# Patient Record
Sex: Female | Born: 1985 | Race: Black or African American | Hispanic: No | Marital: Single | Smoking: Never smoker
Health system: Southern US, Community
[De-identification: ages and names within clinical notes are randomized; demographics above are authoritative.]

## PROBLEM LIST (undated history)

## (undated) DIAGNOSIS — M1990 Unspecified osteoarthritis, unspecified site: Secondary | ICD-10-CM

## (undated) DIAGNOSIS — IMO0002 Reserved for concepts with insufficient information to code with codable children: Secondary | ICD-10-CM

## (undated) DIAGNOSIS — R569 Unspecified convulsions: Secondary | ICD-10-CM

## (undated) DIAGNOSIS — F32A Depression, unspecified: Secondary | ICD-10-CM

## (undated) DIAGNOSIS — M2540 Effusion, unspecified joint: Secondary | ICD-10-CM

## (undated) DIAGNOSIS — M329 Systemic lupus erythematosus, unspecified: Secondary | ICD-10-CM

## (undated) DIAGNOSIS — M2550 Pain in unspecified joint: Secondary | ICD-10-CM

## (undated) DIAGNOSIS — F329 Major depressive disorder, single episode, unspecified: Secondary | ICD-10-CM

## (undated) HISTORY — DX: Reserved for concepts with insufficient information to code with codable children: IMO0002

## (undated) HISTORY — DX: Systemic lupus erythematosus, unspecified: M32.9

## (undated) HISTORY — DX: Pain in unspecified joint: M25.50

---

## 2010-07-30 ENCOUNTER — Ambulatory Visit
Admission: RE | Admit: 2010-07-30 | Discharge: 2010-07-30 | Disposition: A | Discharge status: Home / Self Care | Payer: Federal, State, Local not specified - PPO | Source: Ambulatory Visit | Attending: Family Medicine | Admitting: Family Medicine

## 2010-07-30 ENCOUNTER — Other Ambulatory Visit: Payer: Self-pay | Admitting: Family Medicine

## 2010-07-30 DIAGNOSIS — M2550 Pain in unspecified joint: Secondary | ICD-10-CM

## 2010-08-07 ENCOUNTER — Ambulatory Visit: Payer: Federal, State, Local not specified - PPO

## 2011-07-19 ENCOUNTER — Ambulatory Visit: Payer: Federal, State, Local not specified - PPO

## 2011-07-19 ENCOUNTER — Ambulatory Visit (INDEPENDENT_AMBULATORY_CARE_PROVIDER_SITE_OTHER): Payer: Federal, State, Local not specified - PPO | Admitting: Family Medicine

## 2011-07-19 ENCOUNTER — Encounter: Payer: Self-pay | Admitting: Family Medicine

## 2011-07-19 VITALS — BP 109/73 | HR 67 | Temp 98.1°F | Resp 16 | Ht 60.5 in | Wt 110.0 lb

## 2011-07-19 DIAGNOSIS — M542 Cervicalgia: Secondary | ICD-10-CM

## 2011-07-19 DIAGNOSIS — R52 Pain, unspecified: Secondary | ICD-10-CM

## 2011-07-19 DIAGNOSIS — M2550 Pain in unspecified joint: Secondary | ICD-10-CM

## 2011-07-19 LAB — CBC WITH DIFFERENTIAL/PLATELET
Basophils Relative: 0 % (ref 0–1)
Eosinophils Absolute: 0.5 10*3/uL (ref 0.0–0.7)
MCH: 29.5 pg (ref 26.0–34.0)
MCHC: 34.3 g/dL (ref 30.0–36.0)
Neutrophils Relative %: 57 % (ref 43–77)
Platelets: 513 10*3/uL — ABNORMAL HIGH (ref 150–400)
RDW: 12.5 % (ref 11.5–15.5)

## 2011-07-19 LAB — SEDIMENTATION RATE: Sed Rate: 32 mm/hr — ABNORMAL HIGH (ref 0–22)

## 2011-07-19 LAB — BASIC METABOLIC PANEL
CO2: 25 mEq/L (ref 19–32)
Calcium: 9.1 mg/dL (ref 8.4–10.5)
Creat: 0.61 mg/dL (ref 0.50–1.10)

## 2011-07-19 MED ORDER — CYCLOBENZAPRINE HCL 5 MG PO TABS: ORAL_TABLET | ORAL | Status: DC

## 2011-07-19 MED ORDER — MELOXICAM 15 MG PO TABS: ORAL_TABLET | ORAL | Status: DC

## 2011-07-19 NOTE — Patient Instructions (Addendum)

## 2011-07-19 NOTE — Progress Notes (Signed)
  Subjective:    Patient ID: Rose Campbell, female    DOB: 08/01/1985, 26 y.o.   MRN: [id number]  HPI This 26 y.o  AA female is new to [facility]; she c/o chronic diffuse joint pain involving neck, hands,  hips and ankles (esp. left foot). She also has fatigue. She has a sedentary job and denies any acute trauma  to explain joint pain. Prior primary care was in [place], [place]. Never had a comprehensive  evaluation of these symptoms. She had a right hip xray in July 2012 that was normal (sacroiliitis could not  be excluded).   Review of Systems  Constitutional: Positive for fatigue. Negative for fever, chills, activity change, appetite change and unexpected weight change.  HENT: Positive for neck pain. Negative for sore throat, trouble swallowing and neck stiffness.   Eyes: Negative.   Respiratory: Negative for cough, chest tightness and shortness of breath.   Cardiovascular: Negative.   Gastrointestinal: Negative.   Genitourinary: Negative.   Musculoskeletal: Positive for joint swelling and arthralgias. Negative for myalgias, back pain and gait problem.  Skin: Negative.   Neurological: Negative for dizziness, weakness, light-headedness, numbness and headaches.  Hematological: Negative.   Psychiatric/Behavioral: Negative.        Objective:   Physical Exam  Nursing note and vitals reviewed. Constitutional: She is oriented to person, place, and time. She appears well-developed and well-nourished. No distress.  HENT:  Head: Normocephalic and atraumatic.  Right Ear: External ear normal.  Left Ear: External ear normal.  Nose: Nose normal.  Mouth/Throat: Oropharynx is clear and moist.  Eyes: Conjunctivae and EOM are normal. Pupils are equal, round, and reactive to light. No scleral icterus.  Neck: No thyromegaly present.       Decreased ROM with tenderness and spasms in posterior muscles  Cardiovascular: Normal rate, regular rhythm and normal heart sounds.  Exam reveals no gallop.    No murmur heard. Pulmonary/Chest: Effort normal and breath sounds normal. No respiratory distress. She has no wheezes.  Abdominal: Soft. She exhibits no mass. There is no tenderness. There is no guarding.  Musculoskeletal: Normal range of motion. She exhibits tenderness. She exhibits no edema.       Back- SI joints tender with palpation Left foot- tender forefoot; Bilateral arches are fairly well preserved No effusions or redness in any joints; no crepitus  Lymphadenopathy:    She has no cervical adenopathy.  Neurological: She is alert and oriented to person, place, and time. She has normal reflexes. No cranial nerve deficit. She exhibits normal muscle tone. Coordination normal.  Skin: Skin is warm and dry. No rash noted. No erythema.     [facility] reading (PRIMARY) by  Dr. Audria Nine: loss of lordotic curve in cervical spine. No other abnormalities       Assessment & Plan:   1. Joint pain  Vitamin D, 25-hydroxy, CBC with Differential, Sedimentation Rate, Basic metabolic panel, ANA, Rheumatoid factor  RX: Meloxicam 15 mg 1 tablet daily with meal or snack Do not take Ibuprofen with this medication RX: Cyclobenzaprine 5 mg 1 tab bid or hs prn spasms    2. Posterior neck pain  DG Cervical Spine 2-3 Views- pt advised of my reading

## 2011-07-20 ENCOUNTER — Telehealth: Payer: Self-pay

## 2011-07-20 LAB — RHEUMATOID FACTOR: Rhuematoid fact SerPl-aCnc: <10 IU/mL (ref <=14)

## 2011-07-20 NOTE — Telephone Encounter (Signed)
Pt is needing a rx that should have been called in to [facility] norethindrone-ethinyl estradion 1-35mg  please contact pt to let her know it has been [facility] to [facility] so she can go pick-up.Marland Kitchen

## 2011-07-20 NOTE — Telephone Encounter (Signed)
Dr Mcpherson, please advise.

## 2011-07-20 NOTE — Telephone Encounter (Signed)
Please advise... Do not see where this was discussed-- ? May not have finished documenting yet?

## 2011-07-23 ENCOUNTER — Encounter: Payer: Self-pay | Admitting: Family Medicine

## 2011-07-23 DIAGNOSIS — M2550 Pain in unspecified joint: Secondary | ICD-10-CM | POA: Insufficient documentation

## 2011-07-23 NOTE — Progress Notes (Signed)
Quick Note:  Please call pt and advise that ...  She is scheduled to come back in 6 weeks but I would like for her to come in within next few weeks to go over results and discuss further evaluation. ______

## 2011-07-24 ENCOUNTER — Telehealth: Payer: Self-pay

## 2011-07-24 ENCOUNTER — Other Ambulatory Visit: Payer: Self-pay | Admitting: Family Medicine

## 2011-07-24 MED ORDER — NORETHIN-ETH ESTRAD TRIPHASIC 0.5/0.75/1-35 MG-MCG PO TABS: 1.0000 | ORAL_TABLET | Freq: Every day | ORAL | Status: DC

## 2011-07-24 NOTE — Telephone Encounter (Signed)
Pt is wondering about her script for     Pt is needing a rx that should have been called in to [facility] norethindrone-ethinyl estradion 1-35mg 

## 2011-07-24 NOTE — Progress Notes (Addendum)
Spoke w/pt who reported that she did not need her BCPs. She was calling bc she thought there was a third medication that Dr Audria Nine had wanted her to take for her neck pain in addition to the Mobic and Flexeril that was sent to pharmacy (which pt states is not working). Asked Dr Audria Nine who stated there were no other medications she wanted pt on right now, but she continue the two Rxs which may begin to help more if given more time. Dr Audria Nine DOES want pt to schedule an appt to see her in the next 2-3 wks instead of waiting 6 wks d/t some abnormal labs that they need to discuss and see if further evaluation is needed. Called pt back and LMOM per pt request with the above information.

## 2011-07-31 NOTE — Telephone Encounter (Signed)
Left message that rx was sent to pharm on 7/3 and to CB if she did not get it or has any ?'s

## 2011-07-31 NOTE — Telephone Encounter (Signed)
This OCP was refilled by me on 07/24/11 and routed to her pharmacy.

## 2011-08-09 ENCOUNTER — Ambulatory Visit (INDEPENDENT_AMBULATORY_CARE_PROVIDER_SITE_OTHER): Payer: Federal, State, Local not specified - PPO | Admitting: Family Medicine

## 2011-08-09 ENCOUNTER — Encounter: Payer: Self-pay | Admitting: Family Medicine

## 2011-08-09 VITALS — BP 117/82 | HR 67 | Temp 98.1°F | Resp 16 | Ht 60.0 in | Wt 113.2 lb

## 2011-08-09 DIAGNOSIS — M329 Systemic lupus erythematosus, unspecified: Secondary | ICD-10-CM

## 2011-08-09 DIAGNOSIS — IMO0002 Reserved for concepts with insufficient information to code with codable children: Secondary | ICD-10-CM | POA: Insufficient documentation

## 2011-08-09 MED ORDER — PREDNISONE 10 MG PO TABS: ORAL_TABLET | ORAL | Status: DC

## 2011-08-09 NOTE — Patient Instructions (Addendum)
Lupus Lupus (also called systemic lupus erythematosus, SLE) is a disorder of the body's natural defense system (immune system). In lupus, the immune system attacks various areas of the body (autoimmune disease). CAUSES The cause is unknown. However, lupus runs in families. Certain genes can make you more likely to develop lupus. It is 10 times more common in women than in men. Lupus is also more common in African Americans and Asians. Other factors also play a role, such as viruses (Epstein-Barr virus, EBV), stress, hormones, cigarette smoke, and certain drugs. SYMPTOMS Lupus can affect many parts of the body, including the joints, skin, kidneys, lungs, heart, nervous system, and blood vessels. The signs and symptoms of lupus differ from person to person. The disease can range from mild to life-threatening. Typical features of lupus include:  Butterfly-shaped rash over the face.   Arthritis involving one or more joints.   Kidney disease.   Fever, weight loss, hair loss, fatigue.   Poor circulation in the fingers and toes (Raynaud's disease).   Chest pain when taking deep breaths. Abdominal pain may also occur.   Skin rash in areas exposed to the sun.   Sores in the mouth and nose.  DIAGNOSIS Diagnosing lupus can take a long time and is often difficult. An exam and an accurate account of your symptoms and health problems is very important. Blood tests are necessary, though no single test can confirm or rule out lupus. Most people with lupus test positive for antinuclear antibodies (ANA) on a blood test. Additional blood tests, a urine test (urinalysis), and sometimes a kidney or skin tissue sample (biopsy) can help to confirm or rule out lupus. TREATMENT There is no cure for lupus. Your caregiver will develop a treatment plan based on your age, sex, health, symptoms, and lifestyle. The goals are to prevent flares, to treat them when they do occur, and to minimize organ damage and  complications. How the disease may affect each person varies widely. Most people with lupus can live normal lives, but this disorder must be carefully monitored. Treatment must be adjusted as necessary to prevent serious complications. Medicines used for treatment:  Nonsteroidal anti-inflammatory drugs (NSAIDs) decrease inflammation and can help with chest pain, joint pain, and fevers. Examples include ibuprofen and naproxen.   Antimalarial drugs were designed to treat malaria. They also treat fatigue, joint pain, skin rashes, and inflammation of the lungs in patients with lupus.   Corticosteroids are powerful hormones that rapidly suppress inflammation. The lowest dose with the highest benefit will be chosen. They can be given by cream, pills, injections, and through the vein (intravenously).   Immunosuppressive drugs block the making of immune cells. They may be used for kidney or nerve disease.  HOME CARE INSTRUCTIONS  Exercise. Low-impact activities can usually help keep joints flexible without being too strenuous.   Rest after periods of exercise.   Avoid excessive sun exposure.   Follow proper nutrition and take supplements as recommended by your caregiver.   Stress management can be helpful.  SEEK MEDICAL CARE IF:  You have increased fatigue.   You develop pain.   You develop a rash.   You have an oral temperature above 102 F (38.9 C).   You develop abdominal discomfort.   You develop a headache.   You experience dizziness.  FOR MORE INFORMATION National Institute of Neurological Disorders and Stroke: www.ninds.nih.gov American College of Rheumatology: www.rheumatology.org National Institute of Arthritis and Musculoskeletal and Skin Diseases: www.niams.nih.gov Document Released: 12/28/2001 Document Revised:   12/27/2010 Document Reviewed: 04/20/2009 [facility] Patient Information 2012 [place], [place].    DIRECTIONS for taking Prednisone 10 mg tablets-  Day 1-3          2 tablets  3 times a day Day 4-7         2 tablets  2 times a day Day 8- 12      1 tablet    2 times a day Day 13 -        1 tablet daily   Until you see the Rheumatologist

## 2011-08-11 NOTE — Progress Notes (Signed)
  Subjective:    Patient ID: Rose Campbell, female    DOB: May 30, 1985, 26 y.o.   MRN: [id number]  HPI   The pt is a 26 y.o. AA female who first presented with diffuse joint pain, onset years ago. She continues  to have diffuse pain despite Meloxicam and Cyclobenzaprine. She was asked to return to clinic to review  abnormal lab results.   Family Hx: Sister with Autoimmune disorder                    2nd cousin with Lupus    Review of Systems  Constitutional: Positive for fatigue. Negative for fever, activity change, appetite change and unexpected weight change.  Eyes: Negative for visual disturbance.  Respiratory: Negative for chest tightness and shortness of breath.   Cardiovascular: Negative for chest pain.  Gastrointestinal: Negative.   Musculoskeletal: Positive for joint swelling and arthralgias.  Skin: Negative.   Neurological: Negative.   Hematological: Negative.   Psychiatric/Behavioral: Negative.        Objective:   Physical Exam  Nursing note and vitals reviewed. Constitutional: She is oriented to person, place, and time. She appears well-developed and well-nourished. No distress.  HENT:  Head: Normocephalic and atraumatic.  Eyes: Conjunctivae and EOM are normal. No scleral icterus.  Neck: No thyromegaly present.  Cardiovascular: Normal rate and regular rhythm.   Pulmonary/Chest: Effort normal. No respiratory distress.  Musculoskeletal: She exhibits tenderness.       Lower ext joints: knees and ankles are swollen with decreased ROM and tenderness  Neurological: She is alert and oriented to person, place, and time. No cranial nerve deficit.  Skin: Skin is warm and dry. No rash noted. No erythema.  Psychiatric: She has a normal mood and affect. Her behavior is normal. Thought content normal.       Mood is slightly depressed    LABS: ANA + ; homogenous   Titer= 1:320    ESR= 32    Rheumatoid factor- negative (<10)            H/H= 11.7/34.1   Plts=513K      Vit D=  32      Assessment & Plan:   1. Lupus - reviewed currently understood etiology of disease Ambulatory referral to Rheumatology Trial of steroids- Prednisone 10 mg; pt given directions for administration with starting dose= 60 mg daily, tapering to 10 mg by end of week #2.  Pt's sister very helpful in reassuring pt that this medication will reduce symptoms (joint pain); we discussed potential side effects  Pt to contact office with any questions or concerns

## 2011-09-12 ENCOUNTER — Telehealth: Payer: Self-pay

## 2011-09-12 DIAGNOSIS — G8929 Other chronic pain: Secondary | ICD-10-CM

## 2011-09-12 DIAGNOSIS — M25562 Pain in left knee: Secondary | ICD-10-CM

## 2011-09-12 NOTE — Telephone Encounter (Signed)
I have called patient to advise.  

## 2011-09-12 NOTE — Telephone Encounter (Signed)
Referral made to [facility] or [facility]

## 2011-09-12 NOTE — Telephone Encounter (Signed)
PT STATES DR MCPHERSON REFERRED HER TO A [facility] AND SHE HAD A HORRIBLE EXPERIENCE AND WILL NOT BE GOING BACK TO THEM, BUT WOULD LIKE  TO BE REFERRED SOMEWHERE. PLEASE CALL [phone]

## 2011-10-14 NOTE — Telephone Encounter (Signed)
DONE

## 2011-11-06 ENCOUNTER — Encounter: Payer: Self-pay | Admitting: Family Medicine

## 2011-11-06 ENCOUNTER — Ambulatory Visit (INDEPENDENT_AMBULATORY_CARE_PROVIDER_SITE_OTHER): Payer: Federal, State, Local not specified - PPO | Admitting: Family Medicine

## 2011-11-06 VITALS — BP 110/66 | HR 77 | Temp 98.5°F | Resp 16 | Ht 61.0 in | Wt 116.0 lb

## 2011-11-06 DIAGNOSIS — M329 Systemic lupus erythematosus, unspecified: Secondary | ICD-10-CM

## 2011-11-06 MED ORDER — PREDNISONE 10 MG PO TABS: ORAL_TABLET | ORAL | Status: DC

## 2011-11-06 NOTE — Progress Notes (Signed)
S:  This young 26 y.o. AA female has a presumptive diagnosis of Lupus based on elevated ANA titer and symptoms that have been responsive to Prednisone. She was seen by a Rheumatologist in July and did not have a pleasant experience.  She requested a second opinion in August which was scheduled with [facility] practice but had to cancel that appt. She is here today to get medication refill ( joint pain has recurred in hip and ankle) and to have referral rescheduled.  ROS: Negative for diaphoresis, fever/chills, rash, nausea/vomiting, abdominal pain, SOB, CP or tightness, numbness or weakness.  Positive for mild fatigue and joint pain with swelling.  O:  Filed Vitals:   11/06/11 0843  BP: 110/66                                     Weight is up 3 lbs  Pulse: 77  Temp: 98.5 F (36.9 C)  Resp: 16   GEN: In NAD; WN,WD. HENT: [place]/AT; EOMI with clear conj/scl. Otherwise normal. COR: RRR. LUNGS: Normal resp rate; unlabored. MS: Ankle joints mildly swollen w/o significant erythema. NEURO: A&O x 3; CNs intact. Nonfocal.  A/P: 1. SLE (systemic lupus erythematosus)  Ambulatory referral to Rheumatology (Dr. Corliss Skains ar [facility]); Refill Prednisone 10 mg daily or as directed

## 2011-11-06 NOTE — Patient Instructions (Signed)
You are being referred to Dr. Corliss Skains at [facility] ( [facility] Medicine Orthopedic practice); she is a Publishing rights manager. If the appt date and time do not work with your schedule, please call them to get an appt that works for you.  Prednisone 10 mg tablets have been refilled; take 1 tablet twice a day for 5 days then reduce your dose to 1 tablet daily. Take all doses with food or snack.

## 2012-02-20 ENCOUNTER — Telehealth: Payer: Self-pay | Admitting: Radiology

## 2012-02-20 MED ORDER — PREDNISONE 10 MG PO TABS: ORAL_TABLET | ORAL | Status: DC

## 2012-02-20 NOTE — Telephone Encounter (Signed)
Please advise on Rx for prednisone 10mg . [facility] called, state they electronically submitted, but I do not see this request. Please advise, looks like you referred to Rheumatology.

## 2012-02-20 NOTE — Telephone Encounter (Signed)
I have authorized Prednisone x one refill. I did order Rheumatology referral in October 2013. I am concerned that she is not being managed by a specialist as she needs; please find out what the delay is re: Rheumatology Evaluation. Thank you.

## 2012-02-21 NOTE — Telephone Encounter (Signed)
Voice mail box not set up yet

## 2012-02-24 NOTE — Telephone Encounter (Signed)
Letter sent.

## 2012-05-01 ENCOUNTER — Ambulatory Visit
Admission: RE | Admit: 2012-05-01 | Discharge: 2012-05-01 | Disposition: A | Discharge status: Home / Self Care | Payer: Federal, State, Local not specified - PPO | Source: Ambulatory Visit | Attending: Orthopaedic Surgery | Admitting: Orthopaedic Surgery

## 2012-05-01 ENCOUNTER — Other Ambulatory Visit: Payer: Self-pay | Admitting: Orthopaedic Surgery

## 2012-05-01 DIAGNOSIS — M25552 Pain in left hip: Secondary | ICD-10-CM

## 2012-05-01 LAB — SYNOVIAL CELL COUNT + DIFF, W/ CRYSTALS
Crystals, Fluid: NONE SEEN
Eosinophils-Synovial: 0 % (ref 0–1)
Neutrophil, Synovial: 93 % — ABNORMAL HIGH (ref 0–25)

## 2012-05-01 LAB — GLUCOSE, SYNOVIAL FLUID: Glucose, Synovial Fluid: 24 mg/dL

## 2012-05-04 LAB — BODY FLUID CULTURE: Gram Stain: NONE SEEN

## 2012-05-06 ENCOUNTER — Other Ambulatory Visit: Payer: Self-pay | Admitting: Orthopaedic Surgery

## 2012-05-06 DIAGNOSIS — M25552 Pain in left hip: Secondary | ICD-10-CM

## 2012-05-06 LAB — ANAEROBIC CULTURE: Gram Stain: NONE SEEN

## 2012-05-26 ENCOUNTER — Ambulatory Visit
Admission: RE | Admit: 2012-05-26 | Discharge: 2012-05-26 | Disposition: A | Discharge status: Home / Self Care | Payer: Federal, State, Local not specified - PPO | Source: Ambulatory Visit | Attending: Orthopaedic Surgery | Admitting: Orthopaedic Surgery

## 2012-05-26 DIAGNOSIS — M25552 Pain in left hip: Secondary | ICD-10-CM

## 2012-05-26 LAB — SYNOVIAL CELL COUNT + DIFF, W/ CRYSTALS
Eosinophils-Synovial: 0 % (ref 0–1)
Lymphocytes-Synovial Fld: 0 % (ref 0–20)
Monocyte/Macrophage: 3 % — ABNORMAL LOW (ref 50–90)

## 2012-05-26 LAB — GLUCOSE, SYNOVIAL FLUID: Glucose, Synovial Fluid: 101 mg/dL

## 2012-05-29 LAB — BODY FLUID CULTURE: Organism ID, Bacteria: NO GROWTH

## 2012-05-31 LAB — ANAEROBIC CULTURE: Gram Stain: NONE SEEN

## 2012-06-09 ENCOUNTER — Other Ambulatory Visit: Payer: Self-pay | Admitting: Orthopedic Surgery

## 2012-06-09 DIAGNOSIS — M25551 Pain in right hip: Secondary | ICD-10-CM

## 2012-06-23 LAB — FUNGUS CULTURE W SMEAR: Smear Result: NONE SEEN

## 2012-06-30 ENCOUNTER — Other Ambulatory Visit: Payer: Federal, State, Local not specified - PPO

## 2012-07-01 ENCOUNTER — Ambulatory Visit (INDEPENDENT_AMBULATORY_CARE_PROVIDER_SITE_OTHER): Payer: Federal, State, Local not specified - PPO | Admitting: Family Medicine

## 2012-07-01 ENCOUNTER — Encounter: Payer: Self-pay | Admitting: Family Medicine

## 2012-07-01 VITALS — BP 102/70 | HR 75 | Temp 98.7°F | Resp 16 | Ht 60.5 in | Wt 130.8 lb

## 2012-07-01 DIAGNOSIS — R638 Other symptoms and signs concerning food and fluid intake: Secondary | ICD-10-CM

## 2012-07-01 DIAGNOSIS — R635 Abnormal weight gain: Secondary | ICD-10-CM

## 2012-07-01 DIAGNOSIS — Z3041 Encounter for surveillance of contraceptive pills: Secondary | ICD-10-CM | POA: Insufficient documentation

## 2012-07-01 DIAGNOSIS — M25559 Pain in unspecified hip: Secondary | ICD-10-CM

## 2012-07-01 MED ORDER — PREDNISONE 5 MG PO TABS: ORAL_TABLET | ORAL | Status: DC

## 2012-07-01 NOTE — Progress Notes (Signed)
S:  This 27 y.o.AA female is here w/ her mother, Bonita Quin, to discuss chronic joint pain and results of evaluation by Dr. Pollyann Savoy. Pt was referred to her w/ presumptive diagnosis of Lupus; she has been taking Prednisone for almost 12 months. Pt  reports that L hip pain resulted in joint aspiration by one of the orthopedist in the group. Joint fluid showed "increased white cells" per patient; pt reports that Dr. Corliss Skains is concerned about pt's long term use of Prednisone and accompanying side effects. Pt and mother do not have a definitive diagnosis for etiology of joint pain. Dr. Corliss Skains has referred pt to [facility] for evaluation with a rheumatologist.  Pt is concerned about weight gain; her mother has suggested she start juicing but pt has not adopted this self-care nutrition program yet. They also have concerns about health insurance once pt turns 27 and is no longer eligible for coverage on parent's policy. Pt is employed part-time but has no benefits.   Patient Active Problem List   Diagnosis Date Noted  . Lupus 08/09/2011  . Joint pain 07/23/2011    PMHx, Soc Hx and Fam Hx reviewed. Pt reports cousin has Lupus.  ROS: As per HPI; negative for fever/chills, diaphoresis, HA, dizziness, CP or tightness, palpitations, edema, GI problems, rashes, erythema, myalgias, weakness, numbness, behavior changes or sleep disturbance.  O:  Filed Vitals:   07/01/12 0815  BP: 102/70                                    Weight is up 20 pounds from 12 months ago.  Pulse: 75  Temp: 98.7 F (37.1 C)  Resp: 16   GEN: In NAD; WN,WD. HENT: [place]/AT; EOMI w/ clear conj/sclerae. EACs/nose/oroph unremarkable. COR: RRR. LUNGS: Normal resp rate and effort. SKIN: W&D; no erythema or rashes. MS: MAEs; no joint effusions or deformities. NEURO: A&O x 3; CNs intact. Nonfocal.  A/P: Pain in joint, pelvic region and thigh, unspecified laterality- follow-up at [facility] as scheduled.  Continue Prednisone 20 mg daily. Lengthy discussion re: need for evaluation at tertiary care center; appropriate questions re: repeat imaging and treatment plan.  Weight gain due to medication- advised pt try juicing and making smoothies (as her mother had suggested). Maintain moderate activity level.  Mother and pt advised initiating inquiries about [employer] and affordable medical insurance; pt may qualify for MCD or subsidy with insurance once she is ages off her parents' insurance.

## 2012-07-01 NOTE — Patient Instructions (Addendum)
Continue current medications for now; we have discussed getting a NINJA to use for making juices and smoothies from fresh fruits and vegetables. You need to improve your nutrition to increase intake of foods that have anti-inflammatory purposes. Also try to stay active; you may want to get in a pool this summer and move around in the water to help maintain activity level and joint mobility. Gentle range-of-motion exercises are good for you.

## 2012-09-02 ENCOUNTER — Telehealth: Payer: Self-pay

## 2012-09-02 NOTE — Telephone Encounter (Signed)
PATIENT WOULD LIKE DR. MCPHERSON TO TELL HER SOMETHING THAT SHE CAN USE OVER-THE-COUNTER FOR FLUID SHE HAS ON HER (R) KNEE. DR. Audria Nine HAS NOT SEEN HER FOR IT BEFORE.  BEST PHONE [phone] (CELL)   PHARMACY CHOICE IS [facility] ON [address].   MBC

## 2012-09-02 NOTE — Telephone Encounter (Signed)
Called her, advised she can ice/ try ibuprofen 800mg  tid, she should come in if this is not helpful. Patient advised.

## 2012-09-29 ENCOUNTER — Ambulatory Visit (INDEPENDENT_AMBULATORY_CARE_PROVIDER_SITE_OTHER): Payer: Federal, State, Local not specified - PPO | Admitting: Family Medicine

## 2012-09-29 ENCOUNTER — Encounter: Payer: Self-pay | Admitting: Family Medicine

## 2012-09-29 VITALS — BP 113/76 | HR 81 | Temp 98.6°F | Resp 16 | Ht 61.0 in | Wt 138.0 lb

## 2012-09-29 DIAGNOSIS — Z130 Encounter for screening for diseases of the blood and blood-forming organs and certain disorders involving the immune mechanism: Secondary | ICD-10-CM

## 2012-09-29 DIAGNOSIS — M25561 Pain in right knee: Secondary | ICD-10-CM

## 2012-09-29 DIAGNOSIS — M25569 Pain in unspecified knee: Secondary | ICD-10-CM

## 2012-09-29 LAB — GLUCOSE, POCT (MANUAL RESULT ENTRY): POC Glucose: 117 mg/dl — AB (ref 70–99)

## 2012-09-29 MED ORDER — TRAMADOL HCL 50 MG PO TABS: 50.0000 mg | ORAL_TABLET | Freq: Three times a day (TID) | ORAL | Status: DC | PRN

## 2012-09-29 MED ORDER — PREDNISONE 5 MG PO TABS: ORAL_TABLET | ORAL | Status: DC

## 2012-09-29 NOTE — Progress Notes (Signed)
S:  This 27 y.o. AA female has a connective tissue disorder, presumed to be Lupus; pt to be evaluated at [facility] next month. She presents today w/ 22-month hx of R knee pain and "fullness". Both knees are swollen but R knee feels like something is slipping inside the joint. Pt denies trauma; her 2 jobs are sedentary. She increased Prednisone for a few days which helped reduce pain. She has taken nothing else by mouth but heat does help. Pt inquires if she will always be on Prednisone or will the specialist be able to prescribe a different medication for her problem. She has gained ~ 20 lbs since being on this medication and is developing stretch marks.  Patient Active Problem List   Diagnosis Date Noted  . Uses oral contraception 07/01/2012  . Lupus 08/09/2011  . Joint pain 07/23/2011   PMHx, Soc Hx and Medications reviewed.  ROS: As per HPI; negative for fatigue, fever/chills, CP or palpitations, SOB or DOE, GI upset, weakness, myalgias, HA, numbness, behavior changes, agitation or dysphoric mood.   O: Filed Vitals:   09/29/12 1610  BP: 113/76  Pulse: 81  Temp: 98.6 F (37 C)  Resp: 16   GEN: In NAD; WN,WD.  HENT: "Moon facies" noted. EOMI w/ clear conj/sclerae. Otherwise unremarkable. SKIN: W&D; intact w/o erythema, rashes or ecchymoses. MS: Knees- both swollen and warm to touch. R knee- decreased ROM; patella not ballotable; crepitus noted w/ patella compression. Sub-patellar tendon tender w/ moderate palpation. McMurray's test- negative. NEURO: A&O x 3; CNs intact. Nonfocal.  Results for orders placed in visit on 09/29/12  GLUCOSE, POCT (MANUAL RESULT ENTRY)      Result Value Range   POC Glucose 117 (*) 70 - 99 mg/dl  POCT GLYCOSYLATED HEMOGLOBIN (HGB A1C)      Result Value Range   Hemoglobin A1C 4.8      A/P: Knee joint pain, right- suspect small joint effusion related to Lupus.  Prednisone dose increase to 40 mg/day for 3 days then decrease to 30 mg/day for 3-4  days. Continue heat. RX: Tramadol 50 mg 1 tab every 8 hours or just use at bedtime for pain.  Screening for other and unspecified endocrine, nutritional, metabolic, and immunity disorders - Pt advised that she does not have DM; I have reassured her that Rheumatology at [facility] will provide her w/ a definitive diagnosis in addition to developing a treatment plan and medication(s) to get her off Prednisone.        Plan: POCT glucose (manual entry), POCT glycosylated hemoglobin (Hb A1C)

## 2013-01-25 ENCOUNTER — Telehealth: Payer: Self-pay

## 2013-01-25 NOTE — Telephone Encounter (Signed)
Patient calling to speak to Dr. Leward Quan about some disability and FMLA questions she had. Please advise.   [phone]

## 2013-01-26 NOTE — Telephone Encounter (Signed)
Called patient to advise and transferred her to schedule the appointment.

## 2013-01-26 NOTE — Telephone Encounter (Signed)
It would be best for her to schedule a visit so that I can update her health status and can complete forms accurately (if she has some FMLA papers to be completed).

## 2013-02-03 ENCOUNTER — Ambulatory Visit: Payer: Federal, State, Local not specified - PPO | Admitting: Family Medicine

## 2013-02-05 ENCOUNTER — Encounter: Payer: Self-pay | Admitting: Family Medicine

## 2013-02-05 ENCOUNTER — Ambulatory Visit (INDEPENDENT_AMBULATORY_CARE_PROVIDER_SITE_OTHER): Payer: BC Managed Care – PPO | Admitting: Family Medicine

## 2013-02-05 VITALS — BP 100/72 | HR 95 | Temp 99.5°F | Resp 16 | Ht 61.0 in | Wt 128.2 lb

## 2013-02-05 DIAGNOSIS — R638 Other symptoms and signs concerning food and fluid intake: Secondary | ICD-10-CM

## 2013-02-05 DIAGNOSIS — R635 Abnormal weight gain: Secondary | ICD-10-CM

## 2013-02-05 DIAGNOSIS — M25469 Effusion, unspecified knee: Secondary | ICD-10-CM

## 2013-02-05 DIAGNOSIS — T50905A Adverse effect of unspecified drugs, medicaments and biological substances, initial encounter: Secondary | ICD-10-CM

## 2013-02-05 DIAGNOSIS — E876 Hypokalemia: Secondary | ICD-10-CM

## 2013-02-05 LAB — BASIC METABOLIC PANEL
BUN: 7 mg/dL (ref 6–23)
CALCIUM: 9.6 mg/dL (ref 8.4–10.5)
CHLORIDE: 98 meq/L (ref 96–112)
CO2: 27 meq/L (ref 19–32)
Creat: 0.83 mg/dL (ref 0.50–1.10)
GLUCOSE: 65 mg/dL — AB (ref 70–99)
POTASSIUM: 4 meq/L (ref 3.5–5.3)
SODIUM: 138 meq/L (ref 135–145)

## 2013-02-05 LAB — T4, FREE: Free T4: 1.18 ng/dL (ref 0.80–1.80)

## 2013-02-05 LAB — T3, FREE: T3, Free: 2.8 pg/mL (ref 2.3–4.2)

## 2013-02-05 LAB — TSH: TSH: 2.171 u[IU]/mL (ref 0.350–4.500)

## 2013-02-05 MED ORDER — PREDNISONE 5 MG PO TABS: ORAL_TABLET | ORAL | Status: DC

## 2013-02-05 NOTE — Patient Instructions (Addendum)
I have prescribed Prednisone 5 mg 1 tablet three a day for 5 days then 1 tablet twice a day for 5 days to see if the pain and swelling in your knees can be reduced.  ARNICA GEL is an over-the-counter topical joint product that you can apply 2-3 times a day.  You can try applying ice packs to your knees twice a day for 10-15 minutes for next few days. Frozen vegetables work well; place a wash cloth between the bag of veggies and your skin. Get a multivitamin supplement to help improve your nutrition and immune system.  Get the FMLA form from HR at your place of work

## 2013-02-06 NOTE — Progress Notes (Signed)
Quick Note:  Please notify pt that results are normal.   Provide pt with copy of labs. ______ 

## 2013-02-09 ENCOUNTER — Encounter: Payer: Self-pay | Admitting: Family Medicine

## 2013-02-09 NOTE — Progress Notes (Signed)
S:  This 28 y.o. [place] female has Lupus and is in treatment with a Rheumatologist in [facility]. She is currently on a steroid taper w/ current dose Prednisone 2.5 mg twice daily. Today, her knees are swollen and painful for > 2 weeks. She has a low grade fever. No other joints are swollen or painful. Tramadol is prescribed for pain. Pt has next appt w/ specialist in March.  Pt is distraught because of this chronic health issue and how it has negatively impacted her life. She inquires about FMLA and filing for disability. She currently lives alone and works part-time; this means she has no benefits but is still on parents' insurance. Her family is in [place]; she was at home for the holidays and was ill when she left. She has not disclosed to her parents the extent of her illness. She has a family member that lives in [place]; she lived w/ them previously but she prefers living alone. The financial stressors are contributing to mental stress and some sleep problems. She is concerned about weight gain and stretch marks.  Patient Active Problem List   Diagnosis Date Noted  . Uses oral contraception 07/01/2012  . Lupus 08/09/2011  . Joint pain 07/23/2011   PMHx, Soc and Fam Hx reviewed.  Medications reconciled.  ROS: As per HPI.  O: Filed Vitals:   02/05/13 0856  BP: 100/72  Pulse: 95  Temp: 99.5 F (37.5 C)  Resp: 16   GEN: In NAD; appears mildly uncomfortable but is WN,WD. HENT: [place]/AT; EOMMI w/ clear conj/sclerae. EACs/TMs normal. Post ph erythematous but on lesions or exudates. NECK: Supple w/ LAN or TMG. COR: RRR. LUNGS: CTA; normal resp rate and effort. SKIN: W&D; intact w/o rash or lesions. Mild erythema noted. MS: Knees- both are swollen w/ decreased ROM and tenderness w/ palpation. NEURO: A&O x 3; CNs intact. Nonfocal. PSYCH: Mildly depressed and tearful. Pleasant and calm, attentive w/ good eye contact. Not inappropriate or agitated. Speech and thought content  normal.  A/P: Hypokalemia - Plan: Basic metabolic panel  Joint effusion of knee- Increase Prednisone temporarily. Other symptomatic measures discussed.  Weight gain due to medication - Plan: TSH, T4, Free, T3, Free  Pt advised to discuss health status w/ parents. She will bring FMLA from HR and I briefly discussed how to get the disability process started. She needs to consider living w/ family member until her disease is stabilized ; this will help reduce stress for her- financially and mentally. She would prefer to remain in [place] because she likes the Rheumatologist that she sees.  Meds ordered this encounter  Medications  . hydroxychloroquine (PLAQUENIL) 200 MG tablet    Sig: Take by mouth 2 (two) times daily.  Marland Kitchen DISCONTD: predniSONE (DELTASONE) 5 MG tablet    Sig: Take 1 tablet twice a day for 1 week or as directed. Take all doses with meals.    Dispense:  30 tablet    Refill:  1  . predniSONE (DELTASONE) 5 MG tablet    Sig: Take 1 tablet as directed. Take all doses with meals.    Dispense:  30 tablet    Refill:  1

## 2013-02-19 ENCOUNTER — Encounter: Payer: Self-pay | Admitting: Family Medicine

## 2013-02-19 ENCOUNTER — Ambulatory Visit (INDEPENDENT_AMBULATORY_CARE_PROVIDER_SITE_OTHER): Payer: BC Managed Care – PPO | Admitting: Family Medicine

## 2013-02-19 VITALS — BP 120/81 | HR 80 | Temp 98.4°F | Resp 16 | Ht 60.5 in | Wt 129.2 lb

## 2013-02-19 DIAGNOSIS — M2550 Pain in unspecified joint: Secondary | ICD-10-CM

## 2013-02-19 DIAGNOSIS — M329 Systemic lupus erythematosus, unspecified: Secondary | ICD-10-CM

## 2013-02-19 NOTE — Patient Instructions (Addendum)
Continue taking Prednisone 5 mg 1 tablet twice daily for 2 more weeks. Take Aleve 1 tablet twice a day with food or snack. Tramadol at bedtime for night time pain and to help you rest.  Apply moist heat on knees 2-3 times daily for relief.  ARNICA GEL topically can help relieve pain and stiffness.  Have your employer fax any forms to me that need to be completed.  Our fax #: [phone] at [phone].

## 2013-02-19 NOTE — Progress Notes (Signed)
S:  This 28 y.o AA female has Lupus and was seen 2 weeks ago with an acute flare and symptomatic knee pain and swollen joints. She feels better now with daily Prednisone, current dose= 5 mg bid. Tramadol at bedtime helps relieve pain and sedates pt.  Pt c/o increased stiffness when she applies ice packs to knees; heat feels better. She still has swollen knees w/ mild pain; knees "pop" and are tender when pressure is applied to front of knees. Follow-up w/ Rheumatologist is in March 2015.  Work issue re: FMLA form- pt has not been employed w/ company long enough to be eligible for [employer]. She will be eligible in March 2015. She has exhausted her sick time. Her HR person is trying to work w/ her to make sure she has an option available for chronic illness time out of work.  Patient Active Problem List   Diagnosis Date Noted  . Uses oral contraception 07/01/2012  . Lupus 08/09/2011  . Joint pain 07/23/2011   Prior to Admission medications   Medication Sig Start Date End Date Taking? Authorizing Provider  hydroxychloroquine (PLAQUENIL) 200 MG tablet Take by mouth 2 (two) times daily.   Yes Historical Provider, MD  Norethindrone Acet-Ethinyl Est (MICROGESTIN 1.5/30 PO) Take by mouth.   Yes Historical Provider, MD  predniSONE (DELTASONE) 5 MG tablet Take 1 tablet as directed. Take all doses with meals. 02/05/13  Yes Barton Fanny, MD  traMADol (ULTRAM) 50 MG tablet Take 1 tablet (50 mg total) by mouth every 8 (eight) hours as needed for pain. Take all doses with snack. 09/29/12  Yes Barton Fanny, MD  cyclobenzaprine (FLEXERIL) 5 MG tablet Take 1 tablet twice a day or at bedtime prn muscle spasms/ neck pain. 07/19/11   Barton Fanny, MD   ROS: As per HPI; negative for fever/chills, increased fatigue, abnormal weight loss, CP or tightness, SOB, n/v, abd pain, GU symptoms, rash, HA, numbness, weakness or syncope.  O: Filed Vitals:   02/19/13 0944  BP: 120/81  Pulse: 80  Temp: 98.4 F  (36.9 C)  Resp: 16   GEN: in NAD; WN,WD.  Weight up 1 lb. HENT: [place]/AT; EOMI w/ clear conj/sclerae. Otherwise unremarkable. COR: RRR. LUNGS: Normal resp rate and effort. SKIN: W&D; intact w/o diaphoresis, erythema or rashes. Striae on medial aspect of knee joints. MS: Knees- swollen and warm to touch; bilat effusions but patella not ballotable. Patella tenderness. Decreased ROM w/ stiffness; tenderness/ fullness in popliteal spaces. No crepitus or instability. NEURO: A&O x 3; CNs intact.  Gait is normal. Nonfocal. PSYCH: Pleasant and calm with cheerful demeanor. Attentive. Speech and thought content are normal. Judgement is sound.  A/P: Joint pain- Continue Prednisone 5 mg bid for 2 weeks then decrease dose to 1 tablet daily if knees better.  Aleve samples given (1 tab bid w/ food or snack). Continue Tramadol hs. Moist heat prn.  Exacerbation of systemic lupus- Employer to fax any forms to be completed to assist pt w/ sick leave until she qualifies for FMLA.

## 2013-03-10 ENCOUNTER — Ambulatory Visit: Payer: BC Managed Care – PPO | Admitting: Family Medicine

## 2013-03-24 ENCOUNTER — Ambulatory Visit: Payer: BC Managed Care – PPO | Admitting: Family Medicine

## 2013-04-16 ENCOUNTER — Telehealth: Payer: Self-pay

## 2013-04-16 NOTE — Telephone Encounter (Signed)
PT NEEDS FMLA PAPERWORK FILLED OUT BY 04/23/13-- HAS BEEN TRYING TO GET [place] TO FILL THEM OUT FOR A MONTH. PT IS IRRITATED. PLEASE FILL OUT!

## 2013-04-16 NOTE — Telephone Encounter (Signed)
This patient states that her HR department faxed FMLA ppw to our office on February 4,2015 however we never received it. Patient calls back a month and a half later to inquire about this ppw and then had it re-faxed it on 04/15/2013. Placed on Dr. Janelle Floor desk on 04/16/2013. Will fax back to patient's HR department once it is complete. Patient has not paid her $15.00 fee for this yet.

## 2013-04-16 NOTE — Telephone Encounter (Signed)
This was filled out on February 18th, was scanned in, have printed for you, and sent back, I faxed it again, please charge for this. I have sent to you inter office mail

## 2013-04-18 LAB — CBC WITH AUTOMATED DIFF
ABS. BASOPHILS: 0 10*3/uL (ref 0.0–0.06)
ABS. EOSINOPHILS: 0.4 10*3/uL (ref 0.0–0.4)
ABS. LYMPHOCYTES: 1.9 10*3/uL (ref 0.9–3.6)
ABS. MONOCYTES: 0.6 10*3/uL (ref 0.05–1.2)
ABS. NEUTROPHILS: 4.5 10*3/uL (ref 1.8–8.0)
BASOPHILS: 0 % (ref 0–2)
EOSINOPHILS: 6 % — ABNORMAL HIGH (ref 0–5)
HCT: 40 % (ref 35.0–45.0)
HGB: 12.5 g/dL (ref 12.0–16.0)
LYMPHOCYTES: 25 % (ref 21–52)
MCH: 28.5 PG (ref 24.0–34.0)
MCHC: 31.3 g/dL (ref 31.0–37.0)
MCV: 91.1 FL (ref 74.0–97.0)
MONOCYTES: 8 % (ref 3–10)
MPV: 10 FL (ref 9.2–11.8)
NEUTROPHILS: 61 % (ref 40–73)
PLATELET: 527 10*3/uL — ABNORMAL HIGH (ref 135–420)
RBC: 4.39 M/uL (ref 4.20–5.30)
RDW: 14.8 % — ABNORMAL HIGH (ref 11.6–14.5)
WBC: 7.4 10*3/uL (ref 4.6–13.2)

## 2013-04-18 LAB — HCG URINE, QL: HCG urine, QL: NEGATIVE

## 2013-04-18 LAB — METABOLIC PANEL, COMPREHENSIVE
A-G Ratio: 0.7 — ABNORMAL LOW (ref 0.8–1.7)
ALT (SGPT): 10 U/L — ABNORMAL LOW (ref 13–56)
AST (SGOT): 6 U/L — ABNORMAL LOW (ref 15–37)
Albumin: 3.2 g/dL — ABNORMAL LOW (ref 3.4–5.0)
Alk. phosphatase: 79 U/L (ref 45–117)
Anion gap: 6 mmol/L (ref 3.0–18)
BUN/Creatinine ratio: 19 (ref 12–20)
BUN: 14 MG/DL (ref 7.0–18)
Bilirubin, total: 0.4 MG/DL (ref 0.2–1.0)
CO2: 29 mmol/L (ref 21–32)
Calcium: 9 MG/DL (ref 8.5–10.1)
Chloride: 104 mmol/L (ref 100–108)
Creatinine: 0.73 MG/DL (ref 0.6–1.3)
GFR est AA: >60 mL/min/{1.73_m2} (ref >60)
GFR est non-AA: >60 mL/min/{1.73_m2} (ref >60)
Globulin: 4.9 g/dL — ABNORMAL HIGH (ref 2.0–4.0)
Glucose: 64 mg/dL — ABNORMAL LOW (ref 74–99)
Potassium: 3.4 mmol/L — ABNORMAL LOW (ref 3.5–5.5)
Protein, total: 8.1 g/dL (ref 6.4–8.2)
Sodium: 139 mmol/L (ref 136–145)

## 2013-04-18 LAB — URINALYSIS W/ RFLX MICROSCOPIC
Bilirubin: NEGATIVE
Glucose: NEGATIVE mg/dL
Ketone: NEGATIVE mg/dL
Leukocyte Esterase: NEGATIVE
Nitrites: NEGATIVE
Protein: NEGATIVE mg/dL
Specific gravity: 1.02 (ref 1.003–1.030)
Urobilinogen: 0.2 EU/dL (ref 0.2–1.0)
pH (UA): 6.5 (ref 5.0–8.0)

## 2013-04-18 LAB — URINE MICROSCOPIC ONLY
RBC: 2 /hpf (ref 0–5)
WBC: 0 /hpf (ref 0–4)

## 2013-04-18 LAB — D DIMER: D DIMER: 3.61 ug/ml(FEU) — ABNORMAL HIGH (ref <0.46)

## 2013-04-18 LAB — LIPASE: Lipase: 106 U/L (ref 73–393)

## 2013-04-18 LAB — PROTHROMBIN TIME + INR
INR: 1 (ref 0.8–1.2)
Prothrombin time: 13.1 s (ref 11.5–15.2)

## 2013-04-18 LAB — PTT: aPTT: 30.4 s (ref 24.6–37.7)

## 2013-04-18 LAB — D-DIMER, QUANTITATIVE: D-Dimer, Quant: 3.61 ug/ml(FEU) — ABNORMAL HIGH (ref <0.46)

## 2013-04-18 MED ORDER — OXYCODONE-ACETAMINOPHEN 5 MG-325 MG TAB
5-325 mg | ORAL | Status: AC
Start: 2013-04-18 — End: 2013-04-18
  Administered 2013-04-18: 17:00:00 via ORAL

## 2013-04-18 MED ORDER — KETOROLAC TROMETHAMINE 30 MG/ML INJECTION
30 mg/mL (1 mL) | INTRAMUSCULAR | Status: AC
Start: 2013-04-18 — End: 2013-04-18
  Administered 2013-04-18: 17:00:00 via INTRAVENOUS

## 2013-04-18 MED ORDER — ONDANSETRON (PF) 4 MG/2 ML INJECTION
4 mg/2 mL | INTRAMUSCULAR | Status: AC
Start: 2013-04-18 — End: 2013-04-18
  Administered 2013-04-18: 15:00:00 via INTRAVENOUS

## 2013-04-18 MED ORDER — IOPAMIDOL 76 % IV SOLN
370 mg iodine /mL (76 %) | Freq: Once | INTRAVENOUS | Status: AC
Start: 2013-04-18 — End: 2013-04-18
  Administered 2013-04-18: 17:00:00 via INTRAVENOUS

## 2013-04-18 MED ORDER — HYDROMORPHONE (PF) 1 MG/ML IJ SOLN
1 mg/mL | INTRAMUSCULAR | Status: AC
Start: 2013-04-18 — End: 2013-04-18
  Administered 2013-04-18: 15:00:00 via INTRAVENOUS

## 2013-04-18 MED ORDER — OXYCODONE-ACETAMINOPHEN 5 MG-325 MG TAB
5-325 mg | ORAL_TABLET | ORAL | Status: AC | PRN
Start: 2013-04-18 — End: ?

## 2013-04-18 MED ADMIN — sodium chloride 0.9 % bolus infusion 1,000 mL: INTRAVENOUS | @ 15:00:00 | NDC 00409798309

## 2013-04-18 MED FILL — SODIUM CHLORIDE 0.9 % IV: INTRAVENOUS | Qty: 1000

## 2013-04-18 MED FILL — OXYCODONE-ACETAMINOPHEN 5 MG-325 MG TAB: 5-325 mg | ORAL | Qty: 1

## 2013-04-18 MED FILL — ONDANSETRON (PF) 4 MG/2 ML INJECTION: 4 mg/2 mL | INTRAMUSCULAR | Qty: 2

## 2013-04-18 MED FILL — HYDROMORPHONE (PF) 1 MG/ML IJ SOLN: 1 mg/mL | INTRAMUSCULAR | Qty: 1

## 2013-04-18 MED FILL — ISOVUE-370  76 % INTRAVENOUS SOLUTION: 370 mg iodine /mL (76 %) | INTRAVENOUS | Qty: 75

## 2013-04-18 MED FILL — KETOROLAC TROMETHAMINE 30 MG/ML INJECTION: 30 mg/mL (1 mL) | INTRAMUSCULAR | Qty: 1

## 2013-04-18 NOTE — ED Notes (Signed)
Ct updated iv placed.

## 2013-04-18 NOTE — ED Provider Notes (Signed)
HPI Comments: Meghan Phillips is a  28 y.o. Normal build african [place] female h/o Rheumatoid Arthritis presents to the ED via POV c/o left side pain x 1 week. Reports pain hurts both to breath as well as with movement of the chest wall. Denies fever, chills, cp, palps, cough, sob, n/v/d, constipation, brbpr, melena, flank pain, blood in urine, burning/urgency/frequency of urination or rash. Currently only taking Prednisone 2.5 mg PO BID as well as Tramadol 50 mg PO PRN. She notes lives in [place], [place], drove 4 hours here to visit family.     PSX: N/A.     PCM: Dr. Trina Ao    Rheumatologist: Dr. Kittie Plater    Patient is a 28 y.o. female presenting with flank pain.   Flank Pain   Pertinent negatives include no chest pain, no fever, no headaches, no abdominal pain and no dysuria.        Past Medical History   Diagnosis Date   ??? Arthritis         History reviewed. No pertinent past surgical history.      History reviewed. No pertinent family history.     History     Social History   ??? Marital Status: SINGLE     Spouse Name: N/A     Number of Children: N/A   ??? Years of Education: N/A     Occupational History   ??? Not on file.     Social History Main Topics   ??? Smoking status: Never Smoker    ??? Smokeless tobacco: Not on file   ??? Alcohol Use: Not on file   ??? Drug Use: Not on file   ??? Sexual Activity: Not on file     Other Topics Concern   ??? Not on file     Social History Narrative   ??? No narrative on file                  ALLERGIES: Review of patient's allergies indicates no known allergies.      Review of Systems   Constitutional: Negative for fever and chills.   HENT: Negative for ear pain and sore throat.    Eyes: Negative for pain and visual disturbance.   Respiratory: Negative for cough and shortness of breath.    Cardiovascular: Negative for chest pain and palpitations.   Gastrointestinal: Negative for nausea, vomiting, abdominal pain and diarrhea.   Genitourinary: Positive for  flank pain. Negative for dysuria, urgency, frequency, hematuria, vaginal bleeding, vaginal discharge and difficulty urinating.   Musculoskeletal: Negative for joint swelling and arthralgias.   Skin: Negative for color change, pallor, rash and wound.   Neurological: Negative for dizziness and headaches.   Psychiatric/Behavioral: Negative for behavioral problems. The patient is not nervous/anxious.        Filed Vitals:    04/18/13 0901   BP: 133/97   Pulse: 83   Temp: 98 ??F (36.7 ??C)   Resp: 16   Height: '5\' 1"'  (1.549 m)   Weight: 58.514 kg (129 lb)   SpO2: 100%            Physical Exam   Constitutional: She appears well-developed and well-nourished. No distress.   Cardiovascular: Normal rate, regular rhythm and normal heart sounds.  Exam reveals no gallop and no friction rub.    No murmur heard.  Pulmonary/Chest: Effort normal and breath sounds normal. No respiratory distress. She has no wheezes. She has no rales. She exhibits tenderness and  bony tenderness.       Symmetric rise/fall of the chest wall. Speaks in full sentences. Great inspiratory effort.     Abdominal: Soft. Normal appearance and bowel sounds are normal. She exhibits no distension and no mass. There is tenderness in the left upper quadrant. There is no rigidity, no rebound, no guarding and no CVA tenderness.       Soft Abdomen. No rebound or guarding. + MILD TTP LUQ. Normal inspection of the abdomen. Absolutely no CVT.     TTP Left SIDE NOT FLANK. Pain elicited with twist and lateral flexion. No evidence of rash.    Musculoskeletal:        Thoracic back: Normal.        Lumbar back: Normal.   Skin: No rash noted. She is not diaphoretic.   Nursing note and vitals reviewed.       MDM  Number of Diagnoses or Management Options  Abdominal pain, LUQ (left upper quadrant): new and requires workup  Rib pain on left side: new and requires workup  Diagnosis management comments: DDX: Abdominal pain, Pregnancy, Pancreatitis, Ectopic Pregnancy, Gastritis, AMI,  Gastroenteritis, Colitis, Diverticulitis, Ovarian Cyst, Ovarian Torsion, Tubo-Ovarian Abscess, PID, PUD, Cholecystitis, Choledocholithiasis, Mesenteric Ischemia, Ectopic Pregnancy Rupture, Pelvic Pain Syndrome, Acute Cystitis, Pyelonephritis, Renal Colic, Biliary Colic, Perforation, Splenic Flexure Syndrome, Abdominal Aortic Aneurysm, Gastrointestinal Bleed.     Labs largely reassuring. CTA chest was performed given D-Dimer elevation.     CTA CHEST W/WO CONTRAST PRELIMINARY by: Dr. Tenny Craw [phone])  1. No PE or acute intrathoracic process.   2. Bibasilar atelectasis worse on the left.     1:17 PM  Pain much improved. Suspect musculoskeletal component given the reproducibility of the patient's symptoms.     Reviewed workup results, any meds, and discharge instructions OR admission plan with patient and any family present.  Answered all questions. Tasia Catchings, PA  1:18 PM    PLEASE FOLLOW-UP AS DIRECTED WITHOUT FAIL WITHIN THE TIME FRAME RECOMMENDED AS FAILURE TO DO SO COULD RESULT IN WORSENING OF YOUR PHYSICAL CONDITION, DEATH, AND OR PERMANENT DISABILITY.     RETURN TO THE EMERGENCY DEPARTMENT AT IF YOU ARE UNABLE TO FOLLOW-UP AS DIRECTED.     RETURN TO THE EMERGENCY DEPARTMENT AT ONCE IF YOU HAVE SYMPTOMS THAT DO NOT IMPROVE WITH TREATMENT, NEW SYMPTOMS, WORSENING SYMPTOMS, OR ANY OTHER CONCERNS.     THE PATIENT AGREES WITH THE DISCHARGE PLAN AND FOLLOW-UP INSTRUCTIONS. THE PATIENT AGREES TO REVIEW ALL HANDOUTS.          Amount and/or Complexity of Data Reviewed  Clinical lab tests: ordered and reviewed  Tests in the radiology section of CPT??: ordered and reviewed  Tests in the medicine section of CPT??: ordered and reviewed  Discussion of test results with the performing providers: yes  Decide to obtain previous medical records or to obtain history from someone other than the patient: yes  Obtain history from someone other than the patient: yes (Mother)  Review and summarize past medical records: yes   Independent visualization of images, tracings, or specimens: yes    Risk of Complications, Morbidity, and/or Mortality  Presenting problems: moderate  Diagnostic procedures: moderate  Management options: moderate    Patient Progress  Patient progress: improved      Procedures    Diagnosis:   1. Rib pain on left side    2. Abdominal pain, LUQ (left upper quadrant)          Disposition: HOME  Follow-up Information    Follow up With Details Comments [place]    [facility] EMERGENCY DEPT  As needed, If symptoms worsen [facility]    Dr. Ellsworth Lennox  In 2 days  [place], [place]          Current Discharge Medication List      START taking these medications    Details   oxyCODONE-acetaminophen (PERCOCET) 5-325 mg per tablet Take 1 Tab by mouth every four (4) hours as needed (BREAKTHROUGH PAIN ONLY). Max Daily Amount: 6 Tabs.  Qty: 12 Tab, Refills: 0         CONTINUE these medications which have NOT CHANGED    Details   traMADol (ULTRAM) 50 mg tablet Take 50 mg by mouth every six (6) hours as needed for Pain.      predniSONE (DELTASONE) 2.5 mg tablet Take 2.5 mg by mouth two (2) times a day.             Recent Results (from the past 24 hour(s))   URINALYSIS W/ RFLX MICROSCOPIC    Collection Time     04/18/13  9:00 AM       Result Value Ref Range    Color YELLOW      Appearance CLEAR      Specific gravity 1.020  1.003 - 1.030      pH (UA) 6.5  5.0 - 8.0      Protein NEGATIVE   NEG mg/dL    Glucose NEGATIVE   NEG mg/dL    Ketone NEGATIVE   NEG mg/dL    Bilirubin NEGATIVE   NEG      Blood TRACE (*) NEG      Urobilinogen 0.2  0.2 - 1.0 EU/dL    Nitrites NEGATIVE   NEG      Leukocyte Esterase NEGATIVE   NEG     HCG URINE, QL    Collection Time     04/18/13  9:00 AM       Result Value Ref Range    HCG urine, Ql. NEGATIVE   NEG     URINE MICROSCOPIC ONLY    Collection Time     04/18/13  9:00 AM       Result Value Ref Range    WBC 0 to 3  0 - 4 /hpf    RBC 2 to 4  0 - 5 /hpf     Epithelial cells 3+  0 - 5 /lpf    Bacteria 3+ (*) NEG /hpf   CBC WITH AUTOMATED DIFF    Collection Time     04/18/13 10:02 AM       Result Value Ref Range    WBC 7.4  4.6 - 13.2 K/uL    RBC 4.39  4.20 - 5.30 M/uL    HGB 12.5  12.0 - 16.0 g/dL    HCT 40.0  35.0 - 45.0 %    MCV 91.1  74.0 - 97.0 FL    MCH 28.5  24.0 - 34.0 PG    MCHC 31.3  31.0 - 37.0 g/dL    RDW 14.8 (*) 11.6 - 14.5 %    PLATELET 527 (*) 135 - 420 K/uL    MPV 10.0  9.2 - 11.8 FL    NEUTROPHILS 61  40 - 73 %    LYMPHOCYTES 25  21 - 52 %    MONOCYTES 8  3 - 10 %  EOSINOPHILS 6 (*) 0 - 5 %    BASOPHILS 0  0 - 2 %    ABS. NEUTROPHILS 4.5  1.8 - 8.0 K/UL    ABS. LYMPHOCYTES 1.9  0.9 - 3.6 K/UL    ABS. MONOCYTES 0.6  0.05 - 1.2 K/UL    ABS. EOSINOPHILS 0.4  0.0 - 0.4 K/UL    ABS. BASOPHILS 0.0  0.0 - 0.06 K/UL    DF AUTOMATED     METABOLIC PANEL, COMPREHENSIVE    Collection Time     04/18/13 10:02 AM       Result Value Ref Range    Sodium 139  136 - 145 mmol/L    Potassium 3.4 (*) 3.5 - 5.5 mmol/L    Chloride 104  100 - 108 mmol/L    CO2 29  21 - 32 mmol/L    Anion gap 6  3.0 - 18 mmol/L    Glucose 64 (*) 74 - 99 mg/dL    BUN 14  7.0 - 18 MG/DL    Creatinine 0.73  0.6 - 1.3 MG/DL    BUN/Creatinine ratio 19  12 - 20      GFR est AA >60  >60 ml/min/1.109m    GFR est non-AA >60  >60 ml/min/1.747m   Calcium 9.0  8.5 - 10.1 MG/DL    Bilirubin, total 0.4  0.2 - 1.0 MG/DL    ALT 10 (*) 13 - 56 U/L    AST 6 (*) 15 - 37 U/L    Alk. phosphatase 79  45 - 117 U/L    Protein, total 8.1  6.4 - 8.2 g/dL    Albumin 3.2 (*) 3.4 - 5.0 g/dL    Globulin 4.9 (*) 2.0 - 4.0 g/dL    A-G Ratio 0.7 (*) 0.8 - 1.7     LIPASE    Collection Time     04/18/13 10:02 AM       Result Value Ref Range    Lipase 106  73 - 393 U/L   D DIMER    Collection Time     04/18/13 10:02 AM       Result Value Ref Range    D DIMER 3.61 (*) <0.46 ug/ml(FEU)   PROTHROMBIN TIME + INR    Collection Time     04/18/13 10:02 AM       Result Value Ref Range    Prothrombin time 13.1  11.5 - 15.2 sec    INR  1.0  0.8 - 1.2     PTT    Collection Time     04/18/13 10:02 AM       Result Value Ref Range    aPTT 30.4  24.6 - 37.7 SEC

## 2013-04-18 NOTE — ED Notes (Signed)
I have reviewed discharge instructions with the patient.  The patient verbalized understanding.

## 2013-04-18 NOTE — ED Notes (Signed)
Since last Sunday left flank pain . No c/o urinary problems, normal BM.

## 2013-04-22 ENCOUNTER — Encounter: Payer: Self-pay | Admitting: Family Medicine

## 2013-04-22 ENCOUNTER — Ambulatory Visit (INDEPENDENT_AMBULATORY_CARE_PROVIDER_SITE_OTHER): Payer: BC Managed Care – PPO | Admitting: Family Medicine

## 2013-04-22 VITALS — BP 118/77 | HR 92 | Temp 98.7°F | Resp 16 | Ht 60.5 in | Wt 126.2 lb

## 2013-04-22 DIAGNOSIS — M329 Systemic lupus erythematosus, unspecified: Secondary | ICD-10-CM

## 2013-04-22 DIAGNOSIS — M2550 Pain in unspecified joint: Secondary | ICD-10-CM

## 2013-04-22 DIAGNOSIS — R1012 Left upper quadrant pain: Secondary | ICD-10-CM

## 2013-04-22 DIAGNOSIS — IMO0002 Reserved for concepts with insufficient information to code with codable children: Secondary | ICD-10-CM

## 2013-04-22 MED ORDER — TRAMADOL HCL 50 MG PO TABS: 50.0000 mg | ORAL_TABLET | Freq: Three times a day (TID) | ORAL | Status: DC | PRN

## 2013-04-22 MED ORDER — HYDROXYCHLOROQUINE SULFATE 200 MG PO TABS: 200.0000 mg | ORAL_TABLET | Freq: Two times a day (BID) | ORAL | Status: DC

## 2013-04-22 NOTE — Progress Notes (Signed)
S:  This 28 y.o. [place] female has Lupus and is scheduled to see Rheumatologist at [facility] in May. She was unable to go in March due to insurance lapse; she has not ben taking Plaquenil for same reason. Joint pain, esp knees, is worse. Pt getting frustrated about lack of improvement. She is working part-time but missing a lot of time from [employer]" are  increasing. She feels like things are happening w/ her body that she cannot control.  Pt was at home in [place] for a visit this past weekend; had ED evaluation for LUQ pain. Diagnosis- musculoskeletal problem. She has been pain free for last 2-3 days. She has mild constipation caused by narcotic pain med prescribed in [place]. Otherwise , she has no fever, n/v/d, cough, SOB or DOE, dizziness or syncope. She is concerned about stretch marks caused by the steroid.   Patient Active Problem List   Diagnosis Date Noted  . Uses oral contraception 07/01/2012  . Lupus 08/09/2011  . Joint pain 07/23/2011   Prior to Admission medications   Medication Sig Start Date End Date Taking? Authorizing Provider  acetaminophen (TYLENOL) 500 MG tablet Take 500 mg by mouth every 6 (six) hours as needed.   Yes Historical Provider, MD  ibuprofen (ADVIL,MOTRIN) 200 MG tablet Take 200 mg by mouth every 6 (six) hours as needed.   Yes Historical Provider, MD  Norethindrone Acet-Ethinyl Est (MICROGESTIN 1.5/30 PO) Take by mouth.   Yes Historical Provider, MD  oxyCODONE-acetaminophen (PERCOCET/ROXICET) 5-325 MG per tablet Take by mouth every 4 (four) hours as needed for severe pain.   Yes Historical Provider, MD  predniSONE (DELTASONE) 5 MG tablet Take 1 tablet as directed. Take all doses with meals. 02/05/13  Yes Barton Fanny, MD  traMADol (ULTRAM) 50 MG tablet Take 1 tablet (50 mg total) by mouth every 8 (eight) hours as needed. Take all doses with snack.   Yes Barton Fanny, MD  cyclobenzaprine (FLEXERIL) 5 MG tablet Take 1 tablet twice a day  or at bedtime prn muscle spasms/ neck pain. 07/19/11   Barton Fanny, MD  hydroxychloroquine (PLAQUENIL) 200 MG tablet Take 1 tablet (200 mg total) by mouth 2 (two) times daily.    Barton Fanny, MD   PMHx, Surg Hx, Soc and Fam Hx reviewed.  ROS: As per HPI.  O: Filed Vitals:   04/22/13 1354  BP: 118/77  Pulse: 92  Temp: 98.7 F (37.1 C)  Resp: 16   GEN: In NAD: WN,WD. HENT: [place]/AT; EOMI w/ clear conj/sclerae. EACs/nares/ oroph clear. Mucosa moist w/o pallor. NECK: Supple w/o LAN. COR: RRR. LUNGS: Unlabored resp. BACK: Spine straight; no paravertebral pain or spasms. Nontender along rib margins. ABD: Soft and w/o distention. No guarding. No HSM or masses. MS: Knees- swollen and decreased ROM. Hips- Tender L hip. NEURO: A&O x 3; CNs intact. Nonfocal. PSYCH: Pleasant but somewhat flat affect. Calm and attentive w/ good eye contact. Speech is coherent and pt verbalizes her concerns. Thought content is sound. Judgment is appropriate for young adult dealing w/ chronic disease process and life-altering illness.  A/P: Abdominal pain, LUQ- Resolved. Avoid constipation; maintain healthy nutrition.  Lupus- Follow-up w/ Rheumatologist at [facility]. Resume Plaquenil.  Joint pain- Stable.  Meds ordered this encounter  Medications  . oxyCODONE-acetaminophen (PERCOCET/ROXICET) 5-325 MG per tablet    Sig: Take by mouth every 4 (four) hours as needed for severe pain.  Marland Kitchen acetaminophen (TYLENOL) 500 MG tablet  Sig: Take 500 mg by mouth every 6 (six) hours as needed.  Marland Kitchen ibuprofen (ADVIL,MOTRIN) 200 MG tablet    Sig: Take 200 mg by mouth every 6 (six) hours as needed.  . hydroxychloroquine (PLAQUENIL) 200 MG tablet    Sig: Take 1 tablet (200 mg total) by mouth 2 (two) times daily.    Dispense:  60 tablet    Refill:  2  . traMADol (ULTRAM) 50 MG tablet    Sig: Take 1 tablet (50 mg total) by mouth every 8 (eight) hours as needed. Take all doses with snack.     Dispense:  50 tablet    Refill:  0

## 2013-04-29 ENCOUNTER — Telehealth: Payer: Self-pay

## 2013-04-29 NOTE — Telephone Encounter (Signed)
Jasmine took care of this for pt

## 2013-04-29 NOTE — Telephone Encounter (Signed)
Rose Campbell called stated her ADA forms (for FMLA) several people called her and stated her forms was faxed but employer did not receive them. Please leave a VM because Rose Campbell is at work. Rose Campbell request for Dr. Leward Quan to give her a call.

## 2013-07-19 ENCOUNTER — Emergency Department (HOSPITAL_COMMUNITY)
Admission: EM | Admit: 2013-07-19 | Discharge: 2013-07-19 | Disposition: A | Discharge status: Home / Self Care | Payer: BC Managed Care – PPO

## 2013-07-21 ENCOUNTER — Encounter: Payer: Self-pay | Admitting: Family Medicine

## 2013-07-21 ENCOUNTER — Ambulatory Visit (INDEPENDENT_AMBULATORY_CARE_PROVIDER_SITE_OTHER): Payer: BC Managed Care – PPO | Admitting: Family Medicine

## 2013-07-21 VITALS — BP 110/80 | HR 93 | Temp 99.1°F | Resp 16 | Wt 113.6 lb

## 2013-07-21 DIAGNOSIS — M2550 Pain in unspecified joint: Secondary | ICD-10-CM

## 2013-07-21 DIAGNOSIS — M25559 Pain in unspecified hip: Secondary | ICD-10-CM

## 2013-07-21 DIAGNOSIS — F4321 Adjustment disorder with depressed mood: Secondary | ICD-10-CM

## 2013-07-21 DIAGNOSIS — M329 Systemic lupus erythematosus, unspecified: Secondary | ICD-10-CM

## 2013-07-21 MED ORDER — METHYLPREDNISOLONE ACETATE 80 MG/ML IJ SUSP
120.0000 mg | Freq: Once | INTRAMUSCULAR | Status: AC
  Administered 2013-07-21: 120 mg via INTRAMUSCULAR

## 2013-07-21 MED ORDER — PREDNISONE 5 MG PO TABS: ORAL_TABLET | ORAL | Status: DC

## 2013-07-21 MED ORDER — KETOROLAC TROMETHAMINE 60 MG/2ML IM SOLN
60.0000 mg | Freq: Once | INTRAMUSCULAR | Status: AC
  Administered 2013-07-21: 60 mg via INTRAMUSCULAR

## 2013-07-21 MED ORDER — DULOXETINE HCL 20 MG PO CPEP: 20.0000 mg | ORAL_CAPSULE | Freq: Every day | ORAL | Status: DC

## 2013-07-21 MED ORDER — POLYETHYLENE GLYCOL 3350 17 GM/SCOOP PO POWD: 17.0000 g | Freq: Two times a day (BID) | ORAL | Status: DC | PRN

## 2013-07-21 NOTE — Progress Notes (Signed)
Subjective:    Patient ID: Rose Campbell, female    DOB: 1985-11-18, 28 y.o.   MRN: [id number]  Hip Pain     This 28 y.o. AA female has Lupus with diffuse joint pain, especially in the hips. She sees a Merchant navy officer at [facility], last visit about 1 month ago. At that visit, steroid medication was discontinued per pt; according to pt's sister, the specialist wants to clear out her system . Pt has not been taking Plaquenil due to cost (but sister thinks she has met her deductible). Specialist prescribed Percocet for pain; hip pain was present at that last visit but has gotten much worse in last 2 weeks. Pt tried taking some Prednisone that she had; she has no relief w/ Prednisone, Aleve, or Percocet. Pt unable to get out of bed unassisted and cannot walk w/o help. She is unable to work due to pain and drowsiness caused by narcotic. Also, she is now constipated; she has decreased appetite w/ n/v (last vomited few days ago). Did eat dinner last night. Taking Dulcolax for constipation but not having complete evacuation. Feels bloated and uncomfortable.  Pt was seen at [facility] on 07/19/13. She was diagnosed w/ UTI, anemia and high platelets.She has not started the antibiotic (Cephalexin); also received RX: promethazine 25 mg tablets  1 every 6 hours prn.  PHQ-9 screen completed today and pt score= 25. She admits depression and a lot of frustration. "I am not used to being sick; I am trying to be patient and stay strong but it is hard". "I don't like being dependent on other people". She is staying w/ her sister and states her parents will probably have to come down this weekend to help her.  Patient Active Problem List   Diagnosis Date Noted  . Uses oral contraception 07/01/2012  . Lupus 08/09/2011  . Joint pain 07/23/2011    Prior to Admission medications   Medication Sig Start Date End Date Taking? Authorizing Provider  ibuprofen (ADVIL,MOTRIN) 200 MG  tablet Take 200 mg by mouth every 6 (six) hours as needed.   Yes Historical Provider, MD  Norethindrone Acet-Ethinyl Est (MICROGESTIN 1.5/30 PO) Take by mouth.   Yes Historical Provider, MD  oxyCODONE-acetaminophen (PERCOCET/ROXICET) 5-325 MG per tablet Take by mouth every 4 (four) hours as needed for severe pain.   Yes Historical Provider, MD  acetaminophen (TYLENOL) 500 MG tablet Take 500 mg by mouth every 6 (six) hours as needed.    Historical Provider, MD  cyclobenzaprine (FLEXERIL) 5 MG tablet Take 1 tablet twice a day or at bedtime prn muscle spasms/ neck pain. 07/19/11   Barton Fanny, MD  hydroxychloroquine (PLAQUENIL) 200 MG tablet Take 1 tablet (200 mg total) by mouth 2 (two) times daily. 04/22/13   Barton Fanny, MD  predniSONE (DELTASONE) 5 MG tablet Take 1 tablet as directed. Take all doses with meals. 02/05/13   Barton Fanny, MD  traMADol (ULTRAM) 50 MG tablet Take 1 tablet (50 mg total) by mouth every 8 (eight) hours as needed. Take all doses with snack. 04/22/13   Barton Fanny, MD    Review of Systems  Constitutional: Positive for fever, activity change, appetite change and fatigue. Negative for chills and diaphoresis.  Eyes: Negative.   Respiratory: Negative.   Cardiovascular: Negative.   Gastrointestinal: Positive for nausea, vomiting, constipation and abdominal distention.  Musculoskeletal: Positive for arthralgias, joint swelling and myalgias.  Neurological: Positive for weakness.  Psychiatric/Behavioral: Positive  for sleep disturbance and dysphoric mood. Negative for suicidal ideas and self-injury.       Objective:   Physical Exam  Nursing note and vitals reviewed. Constitutional: She is oriented to person, place, and time. Vital signs are normal. She appears well-developed. She appears ill.  HENT:  Head: Normocephalic and atraumatic.  Right Ear: External ear normal.  Left Ear: External ear normal.  Mouth/Throat: Oropharynx is clear and moist.    Eyes: Conjunctivae and EOM are normal. Pupils are equal, round, and reactive to light. No scleral icterus.  Cardiovascular: Normal rate and regular rhythm.   Pulmonary/Chest: Effort normal. No respiratory distress.  Abdominal: She exhibits distension. She exhibits no mass. There is tenderness. There is no guarding.  Musculoskeletal: She exhibits tenderness.       Right elbow: Tenderness found.       Left elbow: Tenderness found.       Right hip: She exhibits tenderness.       Left hip: She exhibits tenderness.       Right knee: She exhibits swelling. Tenderness found.       Left knee: She exhibits swelling. Tenderness found.  Neurological: She is alert and oriented to person, place, and time. No cranial nerve deficit. Coordination normal.  Skin: Skin is warm. No rash noted. She is not diaphoretic.  Psychiatric: Her speech is normal. Judgment and thought content normal. Her affect is labile. Her affect is not inappropriate. She is slowed. She is not agitated and not withdrawn. Cognition and memory are normal. She exhibits a depressed mood.  Pt is tearful w/ flat affect.  PHQ-9 score= 25. She is attentive.    Review of labs from [facility]: Urine = for WBCs and Bacteria but Leukocytes and Nitrite- neg. Hgb < 10 and WBC 11.0 w/ plates . 865H.     Assessment & Plan:  Arthralgia of pelvic region, unspecified laterality - Continue Percocet twice a day.  MIRALAX for constipation. Plan: methylPREDNISolone acetate (DEPO-MEDROL) injection 120 mg, ketorolac (TORADOL) injection 60 mg  Lupus - Resume Prednisone at 10 mg daily until follow-up in 2 weeks. Get back on Plaquenil if can afford it. Plan: methylPREDNISolone acetate (DEPO-MEDROL) injection 120 mg, ketorolac (TORADOL) injection 60 mg  Joint pain - Plan: methylPREDNISolone acetate (DEPO-MEDROL) injection 120 mg, ketorolac (TORADOL) injection 60 mg  Situational depression- Start Cymbalta 20 mg 1 capsule daily.  Meds ordered this encounter   Medications  . methylPREDNISolone acetate (DEPO-MEDROL) injection 120 mg    Sig:   . ketorolac (TORADOL) injection 60 mg    Sig:   . DULoxetine (CYMBALTA) 20 MG capsule    Sig: Take 1 capsule (20 mg total) by mouth daily.    Dispense:  30 capsule    Refill:  3  . predniSONE (DELTASONE) 5 MG tablet    Sig: Take 2 tablets as directed. Take all doses with meals.    Dispense:  60 tablet    Refill:  1  . polyethylene glycol powder (GLYCOLAX/MIRALAX) powder    Sig: Take 17 g by mouth 2 (two) times daily as needed.    Dispense:  3350 g    Refill:  1    I reviewed lab results from tests ordered 06/16/2013 at [facility].   ANA titer= 320 /pattern diffuse.   DNA AB (FARR) < 12.3   RA Factor- negative. ANTI-CCP  < 5.0   C4 Complement = 28 (normal)   C3 Complement= 170 (high) Sed Rate= 54    CMET normal except  protein= 8.7     C- Reactive Protein= 69.8 CBC: 12.4/ 39.4 WBC= 10.5  Plts= 626 K   I will attempt to contact her Rheumatologist at [facility]-  Dr.Mishra Nilamadhab - for recommendations/guidance in this pt's care until she is seen again at their clinic in Sept 2015.

## 2013-07-21 NOTE — Patient Instructions (Addendum)
I have increased Prednisone to 10 mg daily. Try to get the PLAQUENIL and get started on it if you can.  I have prescribed MIRALAX to help with constipation.  Use the Percocet for pain if you can but try not to take it more than twice a day.

## 2013-07-23 ENCOUNTER — Telehealth: Payer: Self-pay | Admitting: Family Medicine

## 2013-07-23 MED ORDER — PREDNISONE 20 MG PO TABS: ORAL_TABLET | ORAL | Status: DC

## 2013-07-23 NOTE — Telephone Encounter (Signed)
I spoke w/ pt to let her know that I placed a call to Dr. Maralyn Sago at [facility] (clinic was closed for holiday weekend). She feels about the same though her GI symptoms have lessened after a BM. Rainy weather not helping. I am increasing Prednisone to 60 mg/day with a taper over the next 11 days. She will take 60 mg/day for 3 days, 40 mg/day for 3 days then 20 mg/day for 5 days. After that, she has instructions to take 10 mg/day until follow-up w/ me. She understands.

## 2013-07-23 NOTE — Telephone Encounter (Signed)
I placed a call to Dr. Maralyn Sago at [facility] in the Rheumatology Dept. The Clinics are closed for holiday weekend. I will try to call upon my return to my clinic on 08/04/13.

## 2013-08-06 ENCOUNTER — Ambulatory Visit (INDEPENDENT_AMBULATORY_CARE_PROVIDER_SITE_OTHER): Payer: BC Managed Care – PPO | Admitting: Family Medicine

## 2013-08-06 ENCOUNTER — Encounter: Payer: Self-pay | Admitting: Family Medicine

## 2013-08-06 VITALS — BP 120/80 | HR 87 | Temp 98.4°F | Resp 16 | Ht 60.0 in | Wt 116.0 lb

## 2013-08-06 DIAGNOSIS — M2550 Pain in unspecified joint: Secondary | ICD-10-CM

## 2013-08-06 DIAGNOSIS — F418 Other specified anxiety disorders: Secondary | ICD-10-CM | POA: Insufficient documentation

## 2013-08-06 DIAGNOSIS — M329 Systemic lupus erythematosus, unspecified: Secondary | ICD-10-CM

## 2013-08-06 DIAGNOSIS — F341 Dysthymic disorder: Secondary | ICD-10-CM

## 2013-08-06 NOTE — Patient Instructions (Signed)
I called an left a message with Dr. Maralyn Sago. Continue taking current medications; you will discontinue Prednisone when you run out. Stay on the Plaquenil. You may need some labs when you return in 3 weeks.

## 2013-08-06 NOTE — Progress Notes (Signed)
S:  This 28 y.o. AA female w/ Lupus and chronic joint pain (hips and knees) is here for follow-up. She feels better w/ less joint pain. She is able to walk unassisted and can climb stairs at home. Appetite is normal and bowel function is normal. She has been afebrile and swelling has decreased in knees and ankles.   She is on low dose Prednisone and has been taking Plaquenil for 2 weeks. Initially, Plaquenil caused a rash/ hives but this resolved after 5 days. Pt is followed at [facility] by  Dr. Kittie Plater, with next appt in Sept. (Icalled and left a detailed message for this physician while pt is in office). Last labs performed 07/19/2013 at [facility].  Patient Active Problem List   Diagnosis Date Noted  . Uses oral contraception 07/01/2012  . Lupus 08/09/2011  . Joint pain 07/23/2011    Prior to Admission medications   Medication Sig Start Date End Date Taking? Authorizing Provider  acetaminophen (TYLENOL) 500 MG tablet Take 500 mg by mouth every 6 (six) hours as needed.   Yes Historical Provider, MD  cyclobenzaprine (FLEXERIL) 5 MG tablet Take 1 tablet twice a day or at bedtime prn muscle spasms/ neck pain. 07/19/11  Yes Barton Fanny, MD  DULoxetine (CYMBALTA) 20 MG capsule Take 1 capsule (20 mg total) by mouth daily. 07/21/13  Yes Barton Fanny, MD  hydroxychloroquine (PLAQUENIL) 200 MG tablet Take 1 tablet (200 mg total) by mouth 2 (two) times daily. 04/22/13  Yes Barton Fanny, MD  ibuprofen (ADVIL,MOTRIN) 200 MG tablet Take 200 mg by mouth every 6 (six) hours as needed.   Yes Historical Provider, MD  Norethindrone Acet-Ethinyl Est (MICROGESTIN 1.5/30 PO) Take by mouth.   Yes Historical Provider, MD  oxyCODONE-acetaminophen (PERCOCET/ROXICET) 5-325 MG per tablet Take by mouth every 4 (four) hours as needed for severe pain.   Yes Historical Provider, MD  polyethylene glycol powder (GLYCOLAX/MIRALAX) powder Take 17 g by mouth 2 (two) times daily as needed. 07/21/13  Yes  Barton Fanny, MD  predniSONE (DELTASONE) 20 MG tablet Take tablets as directed with snack or meal. 07/23/13  Yes Barton Fanny, MD  predniSONE (DELTASONE) 5 MG tablet Take 2 tablets as directed. Take all doses with meals. 07/21/13  Yes Barton Fanny, MD   PMHx, Surg Hx, Soc and Fam HX reviewed.  ROS: As per HPI.  O: Filed Vitals:   08/06/13 1101  BP: 120/80  Pulse: 87  Temp: 98.4 F (36.9 C)  Resp: 16    GEN: In NAD; WN,WD. HENT: [place]/AT. EOMI w/ clear conjh/sclerae. Otherwise unremarkable. SKIN: W&D; intact w/o erythema, rashes or jaundice. MS: Knees- minimal swelling and tenderness. Hips- anterior tenderness and decreased ROM. NEURO: A&O x 3; Cns intact. Nonfocal.  PSYCH: Pleasant and calm. PQH-9 score= 11 (improved from 25).  A/P: Lupus- Continue Prednisone 5 mg (2 tabs daily) for 3 days then reduce dose to 1 tablet daily. Goal is to discontinue Prednisone and maintain pt on Plaquenil 200 mg 1 bid. I left a message for Dr. Maralyn Sago at [facility] asking for guidance managing this pt until she has follow-up in clinic in September.  Joint pain- Improved; treatment plan as above.  RTC in 3 weeks; pt given note to stay out of work until follow-up visit.

## 2013-08-27 ENCOUNTER — Encounter: Payer: Self-pay | Admitting: Family Medicine

## 2013-08-27 ENCOUNTER — Ambulatory Visit (INDEPENDENT_AMBULATORY_CARE_PROVIDER_SITE_OTHER): Payer: BC Managed Care – PPO | Admitting: Family Medicine

## 2013-08-27 VITALS — BP 100/80 | HR 88 | Temp 98.5°F | Resp 16 | Ht 60.5 in | Wt 117.4 lb

## 2013-08-27 DIAGNOSIS — M2550 Pain in unspecified joint: Secondary | ICD-10-CM

## 2013-08-27 DIAGNOSIS — M329 Systemic lupus erythematosus, unspecified: Secondary | ICD-10-CM

## 2013-08-27 LAB — CBC WITH DIFFERENTIAL/PLATELET
BASOS PCT: 0 % (ref 0–1)
Basophils Absolute: 0 10*3/uL (ref 0.0–0.1)
Eosinophils Absolute: 0.2 10*3/uL (ref 0.0–0.7)
Eosinophils Relative: 2 % (ref 0–5)
HEMATOCRIT: 38.5 % (ref 36.0–46.0)
Hemoglobin: 13 g/dL (ref 12.0–15.0)
Lymphocytes Relative: 21 % (ref 12–46)
Lymphs Abs: 1.9 10*3/uL (ref 0.7–4.0)
MCH: 29.4 pg (ref 26.0–34.0)
MCHC: 33.8 g/dL (ref 30.0–36.0)
MCV: 87.1 fL (ref 78.0–100.0)
Monocytes Absolute: 0.8 10*3/uL (ref 0.1–1.0)
Monocytes Relative: 9 % (ref 3–12)
Neutro Abs: 6.1 10*3/uL (ref 1.7–7.7)
Neutrophils Relative %: 68 % (ref 43–77)
PLATELETS: 719 10*3/uL — AB (ref 150–400)
RBC: 4.42 MIL/uL (ref 3.87–5.11)
RDW: 15.8 % — AB (ref 11.5–15.5)
WBC: 9 10*3/uL (ref 4.0–10.5)

## 2013-08-27 NOTE — Patient Instructions (Signed)
We discussed hip pain today and possibility of re-accumulation of fluid in left hip. If you decide that you want to have MRI of left hip, contact the clinic and it will be ordered. Treatment can move forward based on the findings of MRI. Continue current medications for now.

## 2013-08-28 LAB — BASIC METABOLIC PANEL
BUN: 7 mg/dL (ref 6–23)
CALCIUM: 10 mg/dL (ref 8.4–10.5)
CO2: 29 meq/L (ref 19–32)
CREATININE: 0.6 mg/dL (ref 0.50–1.10)
Chloride: 100 mEq/L (ref 96–112)
GLUCOSE: 70 mg/dL (ref 70–99)
Potassium: 4 mEq/L (ref 3.5–5.3)
Sodium: 137 mEq/L (ref 135–145)

## 2013-08-28 LAB — ALT: ALT: 8 U/L (ref 0–35)

## 2013-08-28 LAB — VITAMIN D 25 HYDROXY (VIT D DEFICIENCY, FRACTURES): Vit D, 25-Hydroxy: 23 ng/mL — ABNORMAL LOW (ref 30–89)

## 2013-08-28 NOTE — Progress Notes (Signed)
Subjective:    Patient ID: Rose Campbell, female    DOB: [birth year], 28 y.o.   MRN: [id number]  HPI  This 28 y.o. AA female who has Lupus primarily affecting her hips and knees, returns for follow-up. She is accompanied by her sister. She feels much better, is able to ambulate with mild discomfort, is tolerating medications well and reports better overall mood. Cymbalta was started on 07/21/2013 to treat depression. Prednisone dose is at 5 mg daily. Pt is concerned that L hip pain has recurred and is similar to hip pain she experienced in April 2014. At that time, she had hip joint aspirated at [facility]. Pt reports inability to stand w/ both feet flat; she shifts weight to L leg with bent R knee. Her gait is abnormal; she has to walk wide-legged to reduce pain.  Work status is discussed; pt has FMLA but it covers 1 day/month if she has a Lupus flare.  She has been out of work and is part-time status. She thinks she is ready to return to work but will need a letter to clarify or increase FMLA needs.  Patient Active Problem List   Diagnosis Date Noted  . Depression with anxiety 08/06/2013  . Uses oral contraception 07/01/2012  . Lupus 08/09/2011  . Joint pain 07/23/2011    Prior to Admission medications   Medication Sig Start Date End Date Taking? Authorizing Provider  DULoxetine (CYMBALTA) 20 MG capsule Take 1 capsule (20 mg total) by mouth daily. 07/21/13  Yes Barton Fanny, MD  hydroxychloroquine (PLAQUENIL) 200 MG tablet Take 1 tablet (200 mg total) by mouth 2 (two) times daily. 04/22/13  Yes Barton Fanny, MD  Norethindrone Acet-Ethinyl Est (MICROGESTIN 1.5/30 PO) Take by mouth.   Yes Historical Provider, MD  polyethylene glycol powder (GLYCOLAX/MIRALAX) powder Take 17 g by mouth 2 (two) times daily as needed. 07/21/13  Yes Barton Fanny, MD  predniSONE (DELTASONE) 20 MG tablet Take tablets as directed with snack or meal. 07/23/13  Yes Barton Fanny, MD    predniSONE (DELTASONE) 5 MG tablet Take 2 tablets as directed. Take all doses with meals. 07/21/13  Yes Barton Fanny, MD  acetaminophen (TYLENOL) 500 MG tablet Take 500 mg by mouth every 6 (six) hours as needed.    Historical Provider, MD  cyclobenzaprine (FLEXERIL) 5 MG tablet Take 1 tablet twice a day or at bedtime prn muscle spasms/ neck pain. 07/19/11   Barton Fanny, MD  ibuprofen (ADVIL,MOTRIN) 200 MG tablet Take 200 mg by mouth every 6 (six) hours as needed.    Historical Provider, MD  oxyCODONE-acetaminophen (PERCOCET/ROXICET) 5-325 MG per tablet Take by mouth every 4 (four) hours as needed for severe pain.    Historical Provider, MD    No Known Allergies  History reviewed. No pertinent past surgical history.   History   Social History  . Marital Status: Single    Spouse Name: N/A    Number of Children: N/A  . Years of Education: N/A   Occupational History  . Not on file.   Social History Main Topics  . Smoking status: Never Smoker   . Smokeless tobacco: Not on file  . Alcohol Use: Not on file  . Drug Use: Not on file  . Sexual Activity: Not on file   Other Topics Concern  . Not on file   Social History Narrative  . No narrative on file    Review of Systems  Constitutional: Positive  for activity change and appetite change. Negative for fever, diaphoresis and fatigue.  Eyes: Negative for visual disturbance.  Respiratory: Negative.   Cardiovascular: Negative.   Gastrointestinal: Negative.   Musculoskeletal: Positive for arthralgias, gait problem and joint swelling. Negative for myalgias.  Skin: Negative.   Neurological: Negative.   Psychiatric/Behavioral:       Much improved mood; PHQ-9 score: 25 >> 11.      Objective:   Physical Exam  Nursing note and vitals reviewed. Constitutional: She is oriented to person, place, and time. Vital signs are normal. She appears well-developed and well-nourished. No distress.  Weight : 126 (April 2015) >> 117  (today)  HENT:  Head: Normocephalic and atraumatic.  Right Ear: External ear normal.  Left Ear: External ear normal.  Nose: Nose normal.  Mouth/Throat: Oropharynx is clear and moist.  Eyes: Conjunctivae and EOM are normal. Pupils are equal, round, and reactive to light. No scleral icterus.  Neck: Normal range of motion. Neck supple.  Cardiovascular: Normal rate and regular rhythm.   Pulmonary/Chest: Effort normal. No respiratory distress.  Musculoskeletal:       Right hip: Normal.       Left hip: She exhibits tenderness and deformity. She exhibits no swelling and no crepitus.       Right knee: She exhibits decreased range of motion, effusion and abnormal alignment.       Left knee: She exhibits decreased range of motion, effusion and abnormal alignment.  R hip appears to be higher than L hip.   Neurological: She is alert and oriented to person, place, and time. She displays no atrophy. No cranial nerve deficit or sensory deficit. She exhibits normal muscle tone. Gait abnormal. Coordination normal.  Wide- based gait and stance.  Skin: Skin is warm and dry. No rash noted. She is not diaphoretic. No erythema.  Psychiatric: She has a normal mood and affect. Her speech is normal and behavior is normal. Judgment and thought content normal. Cognition and memory are normal.       Assessment & Plan:  Joint pain - Lengthy discussion about evaluation of recurrent L hip pain. To evaluate if hip fluid has re-accumulated, MRI is necessary. Pt will consider this. Continue current medications for now. Plan: Vitamin D, 25-hydroxy, CBC with Differential  Lupus - Continue Plaquenil, Cymbalta and Prednisone 5 mg daily. Discontinue Prednisone once current supply is gone. Follow-up as scheduled w/ Dr. Maralyn Sago in Sept 2015. Plan: Basic metabolic panel, Vitamin D, 25-hydroxy, ALT  Letter given to pt clarifying FMLA needs based on avearge Lupus flare of once a week.  REMINDER:  Pt needs Tdap (Td 2005) and  advise re: Flu vaccine.

## 2013-08-31 NOTE — Progress Notes (Signed)
Quick Note:  Please advise pt regarding following labs... Your Vitamin D level is a little low. Get Vitamin D3 2000 units and take this supplement daily. All blood counts are normal but platelet count is high; it is higher than it was 2 years ago.  I will try to contact Dr. Maralyn Sago or a local specialist to see if this is common with Lupus and Prednisone or if this may be related to Plaquenil.   You will need to have annual eye examinations while taking Plaquenil. Schedule an exam if you have not had one in the last 12 months. ______

## 2013-09-01 ENCOUNTER — Other Ambulatory Visit: Payer: Self-pay | Admitting: Family Medicine

## 2013-09-01 ENCOUNTER — Telehealth: Payer: Self-pay

## 2013-09-01 DIAGNOSIS — D75838 Other thrombocytosis: Secondary | ICD-10-CM

## 2013-09-01 DIAGNOSIS — R7989 Other specified abnormal findings of blood chemistry: Secondary | ICD-10-CM

## 2013-09-01 NOTE — Telephone Encounter (Signed)
Message copied by Constance Goltz on Wed Sep 01, 2013  6:25 PM ------      Message from: Ellsworth Lennox B      Created: Wed Sep 01, 2013  5:47 PM      Regarding: Repeat CBC       Contact pt and inform her that I would like for her to come in just for a repeat lab- CBC. She has an elevated platelet count; this is chronic and probably related to Lupus and her medications.            I will order the test; she can come in between 8/17/ 2015 and mid-September. I just want to make sure the platelet number is not       a lot higher.            Thank you. ------

## 2013-09-01 NOTE — Telephone Encounter (Signed)
Pt notified and will RTC.  

## 2013-09-06 ENCOUNTER — Telehealth: Payer: Self-pay

## 2013-09-06 DIAGNOSIS — M25552 Pain in left hip: Secondary | ICD-10-CM

## 2013-09-06 NOTE — Telephone Encounter (Signed)
PT WANTED DR MCPHERSON TO KNOW SHE DID GO SEE THE RHEUM DR ON Friday AND WAS TOLD IT WAS OK FOR HER TO HAVE AN MRI, WOULD LIKE TO BE REFERRED PLEASE CALL [phone]

## 2013-09-06 NOTE — Telephone Encounter (Signed)
Will order MRI Left hip per OV. Should this be w wo contrast?

## 2013-09-10 NOTE — Telephone Encounter (Signed)
I have ordered MRI of L hip w/o contrast at [facility] [address].

## 2013-09-13 ENCOUNTER — Telehealth: Payer: Self-pay

## 2013-09-13 NOTE — Telephone Encounter (Signed)
LM to advise pt referral is working on MRI

## 2013-09-13 NOTE — Telephone Encounter (Signed)
Please call [employer] to give more clinical information regarding mri hip sent to clinical to give more clinical information

## 2013-09-14 ENCOUNTER — Telehealth: Payer: Self-pay | Admitting: Family Medicine

## 2013-09-14 NOTE — Telephone Encounter (Signed)
[id number] Member ID [id number] [place] spoke to [facility]- This is not a covered benefit- she is ineligible.

## 2013-09-14 NOTE — Telephone Encounter (Signed)
Let message @ [employer] (MRI hip- need authorization). Need to call again during business hours

## 2013-09-16 NOTE — Telephone Encounter (Signed)
I called [employer] at phone number [phone]. Company representative stated that pt is not currently active with their plan.  Pt will need to contact [employer] to find out if she is active; customer service number is on back of her insurance card. Please contact pt to let her know she needs to look into this before MRI can be ordered. Thank you.

## 2013-09-17 NOTE — Telephone Encounter (Signed)
Left message for her to bring [place] her updated insurance information

## 2013-09-17 NOTE — Telephone Encounter (Signed)
Patient not currently on [employer] either.

## 2013-10-29 ENCOUNTER — Other Ambulatory Visit: Payer: Self-pay | Admitting: Family Medicine

## 2013-11-05 ENCOUNTER — Telehealth: Payer: Self-pay

## 2013-11-05 NOTE — Telephone Encounter (Signed)
Pt called to request a refill of prednisone please!

## 2013-11-07 MED ORDER — PREDNISONE 5 MG PO TABS: ORAL_TABLET | ORAL | Status: DC

## 2013-11-07 NOTE — Telephone Encounter (Signed)
Prednisone 5 mg refill completed.

## 2013-11-30 ENCOUNTER — Other Ambulatory Visit: Payer: Self-pay | Admitting: Family Medicine

## 2013-12-02 ENCOUNTER — Other Ambulatory Visit: Payer: Self-pay | Admitting: Family Medicine

## 2013-12-02 ENCOUNTER — Telehealth: Payer: Self-pay

## 2013-12-02 DIAGNOSIS — M25552 Pain in left hip: Principal | ICD-10-CM

## 2013-12-02 DIAGNOSIS — G8929 Other chronic pain: Secondary | ICD-10-CM

## 2013-12-02 DIAGNOSIS — Z8739 Personal history of other diseases of the musculoskeletal system and connective tissue: Secondary | ICD-10-CM

## 2013-12-02 DIAGNOSIS — M329 Systemic lupus erythematosus, unspecified: Secondary | ICD-10-CM

## 2013-12-02 MED ORDER — PREDNISONE 5 MG PO TABS: ORAL_TABLET | ORAL | Status: DC

## 2013-12-02 NOTE — Telephone Encounter (Signed)
Prednisone 5 mg tablets #60 prescribed 11/07/2013. I will refill it again x 1 but pt needs to check with Rheumatologist to see if he wants her to continue on steroid.   I have placed a new order for MRI of L hip.

## 2013-12-02 NOTE — Telephone Encounter (Signed)
MCPHERSON - Pt says she is ready for you to schedule you for her MRI.  Also she says the pharmacy requested refills on both her cymbalta and prednisone, but only the cymbalta was filled.  Please call [phone]

## 2013-12-03 ENCOUNTER — Telehealth: Payer: Self-pay

## 2013-12-03 NOTE — Telephone Encounter (Signed)
Called to perform Peer-to-peer. Currently closed. Will try again during regular business hours.

## 2013-12-03 NOTE — Telephone Encounter (Signed)
Spoke to pt- she understands to contact her RA Dr. Durward Fortes refills sent to the pharmacy.

## 2013-12-03 NOTE — Telephone Encounter (Signed)
Peer to Peer needed for MRI of left hip wo contrast  No case number [id number] Mem ID [id number]

## 2013-12-06 NOTE — Telephone Encounter (Signed)
Spoke with [employer]. She is unable to find our request without a CPT code for the study to be performed or a denial, which she states is the only reason a peer-to-peer call would be requested.  Please trouble shoot this for me.

## 2013-12-08 ENCOUNTER — Telehealth: Payer: Self-pay

## 2013-12-08 NOTE — Telephone Encounter (Signed)
Spoke with Jeannene Patella, nurse reviewer. Case closed, denied. Appeal initiated.

## 2013-12-08 NOTE — Telephone Encounter (Signed)
Patients insurance with [employer] denied patients request for an MRI. Can someone please do a peer to peer to get approval.  [employer] ID number: [id number] [employer] contract number: [id number] Cpt code- or procedure: 12162/ Lower extremity Joint/ nonjoint MRI.  Phone number for [employer] aim: [phone]

## 2013-12-08 NOTE — Telephone Encounter (Signed)
Peter Congo called with information regarding appeal. Will fax acknowledgement to [place].  Please send all encounter notes, imaging studies, previous treatments for the problem to the fax number below by Monday 12/13/2013.  FAX: [phone] ATTN: PCR  Any questions or concerns, call Peter Congo [phone], ext [phone]

## 2013-12-09 NOTE — Telephone Encounter (Signed)
I have printed out notes related to this problem dating back to 11/06/2011. There is also a copy of the report of the previous MRI of the L hip that pt had in April 2014, resulting in hip aspiration. Pt now has  Chronic hip pain and persistent pain; I suspect she has another effusion of the L hip and needs MRI to evaluate and have this appropriately treated before permanent damage results.  I will have 104 staff get this information back to the appropriate person.

## 2013-12-09 NOTE — Telephone Encounter (Signed)
Information has been faxed for the appeal the MRI denial. Dr Leward Quan printed the information and hopefully this will be resolved soon. To Shaune Leeks

## 2013-12-09 NOTE — Telephone Encounter (Signed)
Thank you. I trust outcome will be positive and in pt's favor.

## 2014-01-10 ENCOUNTER — Ambulatory Visit
Admission: RE | Admit: 2014-01-10 | Discharge: 2014-01-10 | Disposition: A | Discharge status: Home / Self Care | Payer: BC Managed Care – PPO | Source: Ambulatory Visit | Attending: Family Medicine | Admitting: Family Medicine

## 2014-01-10 DIAGNOSIS — M329 Systemic lupus erythematosus, unspecified: Secondary | ICD-10-CM

## 2014-01-10 DIAGNOSIS — Z8739 Personal history of other diseases of the musculoskeletal system and connective tissue: Secondary | ICD-10-CM

## 2014-01-10 DIAGNOSIS — M25552 Pain in left hip: Principal | ICD-10-CM

## 2014-01-10 DIAGNOSIS — G8929 Other chronic pain: Secondary | ICD-10-CM

## 2014-01-16 ENCOUNTER — Ambulatory Visit
Admission: RE | Admit: 2014-01-16 | Discharge: 2014-01-16 | Disposition: A | Discharge status: Home / Self Care | Payer: BC Managed Care – PPO | Source: Ambulatory Visit | Attending: Family Medicine | Admitting: Family Medicine

## 2014-01-19 ENCOUNTER — Telehealth: Payer: Self-pay | Admitting: Family Medicine

## 2014-01-19 MED ORDER — OXYCODONE-ACETAMINOPHEN 5-325 MG PO TABS: 1.0000 | ORAL_TABLET | ORAL | Status: DC | PRN

## 2014-01-19 NOTE — Telephone Encounter (Signed)
I spoke with pt and gave result of L Hip MRI >> effusion and Lupus arthropathy. Pt last seen by rheumatologist in Nov 2015; he is awaiting results of MRI but is maintaining pt on Plaquenil and Prednisone (though he wants to discontinue steroid). Pt has significant pain for 1-2 weeks; has no pain medication. She has not been able to work. I am prescribing oxycodone for her; she will pick up RX from 102 on Jan 20, 2014. Pt has been trying to schedule follow-up with rheumatologist at [facility], [place]; she will try again tomorrow and next week. I will send a message to Dr. Kittie Plater in [facility]; he should be able to see the MRI report at [facility]. I will contact [facility]TH[facility] in next few days to see if specialist can drain effusion off L hip.

## 2014-01-20 ENCOUNTER — Telehealth: Payer: Self-pay

## 2014-01-20 NOTE — Telephone Encounter (Signed)
Pt called to have chart updated. She has an appointment with a Rheumatologist this coming Wednesday. She would like Dr. Leward Quan. Please advise at [phone]

## 2014-01-21 NOTE — Telephone Encounter (Signed)
Note to Dr Leward Quan, Juluis Rainier patient is going to see Rheumatologist

## 2014-01-23 NOTE — Telephone Encounter (Signed)
I will try to contact Dr. Maralyn Sago at [facility] before pt's appt on Wednesday to discuss recent MRI of L hip.

## 2014-01-25 NOTE — Telephone Encounter (Signed)
I spoke with Dr. Maralyn Sago at [facility]; he is pt's rheumatologist and will be seeing pt tomorrow. A copy of the MRI L Hip report was faxed to his office today.

## 2014-01-25 NOTE — Telephone Encounter (Signed)
I spoke with Rose Campbell to let her know that Dr. Kittie Plater is aware of the MRI result and he has been faxed a copy of the report. I will await his input re: treatment of joint pain and effusion after Rose Campbell's OV w/ him tomorrow.

## 2014-01-26 ENCOUNTER — Telehealth: Payer: Self-pay | Admitting: Family Medicine

## 2014-01-26 DIAGNOSIS — M329 Systemic lupus erythematosus, unspecified: Secondary | ICD-10-CM

## 2014-01-26 DIAGNOSIS — M25451 Effusion, right hip: Secondary | ICD-10-CM

## 2014-01-26 DIAGNOSIS — M25452 Effusion, left hip: Secondary | ICD-10-CM

## 2014-01-26 NOTE — Telephone Encounter (Signed)
Patient called and states that her rheumatologist does not want to drain the fluid from her knee. Please call patient back at  [phone]

## 2014-01-30 NOTE — Telephone Encounter (Signed)
Spoke w/ pt about visit w/ Dr. Kittie Plater at [facility] last week. She is not responding adequately to Plaquenil. He wants her to consider MTX again or Humira. Pt is willing to try MTX again but is frustrated about lab and eye monitoring that is required with that drug. She states she has researched Humira and does not want to try that medication. She feels like the specialist is not providing enough information to her; she does not ask many questions and stated that she did not feel like she should have to ask questions when it comes to information about medications, etc. She still has hip and knee pain. Dr. Jerene Pitch gave her increased dose of Prednisone. He informed her that he did not do hip joint aspirations and wanted to refer her to [facility]. She did not want to do this. She does want hip joint fluid aspirated as before at [facility]. She is frustrated about lack of improvement, stating she does not "have the luxury to stay home and miss work and go to multiple appointments", etc. I pointed out to her that medicine is not an exact science and that, when a pt does not respond to a prescribed treatment, it often takes trying different treatments to find the one that is most effective. I encouraged her to think about her particular situation and figure out what plan will work for her going forward to have a better quality of life and to get Lupus under control. Pt will think about treatment choices offered by Dr. Jerene Pitch and contact him when she has decided what she wants to do.

## 2014-02-02 ENCOUNTER — Other Ambulatory Visit: Payer: Self-pay

## 2014-02-02 NOTE — Telephone Encounter (Signed)
Pt of Dr. Leward Quan requesting refills on oxyCODONE-acetaminophen (PERCOCET/ROXICET) 5-325 MG per tablet [[id number]]   Pharmacy:[facility], [place] [address]  Pt Phone: [phone]

## 2014-02-03 MED ORDER — OXYCODONE-ACETAMINOPHEN 5-325 MG PO TABS: 1.0000 | ORAL_TABLET | ORAL | Status: DC | PRN

## 2014-02-03 NOTE — Telephone Encounter (Signed)
Oxycodone prescription printed and can be picked up at [address] today, Thursday- Feb 03, 2014. Signature required.

## 2014-02-03 NOTE — Telephone Encounter (Signed)
Left detailed message on vm.

## 2014-02-18 ENCOUNTER — Telehealth: Payer: Self-pay

## 2014-02-18 MED ORDER — OXYCODONE-ACETAMINOPHEN 5-325 MG PO TABS: 1.0000 | ORAL_TABLET | ORAL | Status: DC | PRN

## 2014-02-18 NOTE — Telephone Encounter (Signed)
I phoned pt and left a message: Referral Pool to check on status of procedure and get back to her. Paperwork will be completed once I receive it. Percocet RX will be at [address] for pick-up this weekend.

## 2014-02-18 NOTE — Telephone Encounter (Signed)
Pt calling to make sure Dr Leward Quan got her paperwork that she faxed over that was to be completed.  Pt also requesting refill for percocet.  Pt also states that Dr. Leward Quan was to be referring her to [facility] and pt has yet to hear anything about that referral.   Please advise. CB# [phone]

## 2014-02-19 ENCOUNTER — Telehealth: Payer: Self-pay | Admitting: Physician Assistant

## 2014-02-19 MED ORDER — OXYCODONE-ACETAMINOPHEN 5-325 MG PO TABS: 1.0000 | ORAL_TABLET | Freq: Four times a day (QID) | ORAL | Status: DC | PRN

## 2014-02-19 NOTE — Telephone Encounter (Signed)
Refilling oxycodone for Dr. Leward Quan given a printing error at 104.  Philis Fendt, MS, PA-C   12:46 PM, 02/19/2014

## 2014-02-28 ENCOUNTER — Telehealth: Payer: Self-pay

## 2014-02-28 NOTE — Telephone Encounter (Signed)
Pt wanted Dr. Leward Quan to know she made an appt with [facility] and the paperwork she was given have to be turned in by the 12th. Please call pt at [phone]

## 2014-03-02 NOTE — Telephone Encounter (Signed)
Pt states she had some paperwork faxed to [place] re: extension of ADA accommodations at work. It needs to be completed by Feb. 12, 2016. If you can find that paperwork, please have it delivered to me at [address] ASAP so I can complete it before her deadline. She wants to pick up the completed form. Thanks.

## 2014-03-02 NOTE — Telephone Encounter (Signed)
ppw was sent to [address] a few days ago.

## 2014-03-03 NOTE — Telephone Encounter (Signed)
Dr. Leward Quan does not have this paperwork.  Do you have it?

## 2014-03-04 ENCOUNTER — Other Ambulatory Visit: Payer: Self-pay | Admitting: Family Medicine

## 2014-03-04 ENCOUNTER — Telehealth: Payer: Self-pay | Admitting: Family Medicine

## 2014-03-04 MED ORDER — OXYCODONE-ACETAMINOPHEN 5-325 MG PO TABS: 1.0000 | ORAL_TABLET | Freq: Four times a day (QID) | ORAL | Status: DC | PRN

## 2014-03-04 NOTE — Telephone Encounter (Signed)
I spoke to pt this AM to let her know that I have not seen her paperwork and the solution is for her to contact her employer and request they re-fax it to [phone]. She is upset but states she will do that. We are waiting for the fax. I told her that I would get it back to her employer once I received it and completed it.

## 2014-03-04 NOTE — Telephone Encounter (Signed)
Form completed and faxed to pt's employer before 3:30 PM. This was completed at [address] [employer]. Confirmation received and pt advised.

## 2014-03-04 NOTE — Telephone Encounter (Signed)
Patient dropped off forms at 104. Stated she need Dr. Leward Quan to complete and fax forms over to her job. I will give the form to Dr. Leward Quan to complete.

## 2014-03-04 NOTE — Telephone Encounter (Signed)
Pt brought in paperwork.  It was filled out and faxed.  Pt notified and form can be picked up

## 2014-03-04 NOTE — Telephone Encounter (Signed)
Prescription for pain medication is at [address] for pick-up this afternoon (Friday, 03/04/14). Signature required.

## 2014-03-04 NOTE — Telephone Encounter (Signed)
FMLA/Disability department does not have any paperwork on this patient. We have not received any via fax and no completed paperwork has been returned to our department.   Thanks, [employer]

## 2014-03-04 NOTE — Telephone Encounter (Addendum)
Patient is calling back about her paperwork. She is upset. Where is it? FMLA/Disabilitiies does not have it. Patient has requested to only speak with Dr. Leward Quan concerning this. Patient also requested a refill on Percocet.

## 2014-03-10 ENCOUNTER — Telehealth: Payer: Self-pay

## 2014-03-10 NOTE — Telephone Encounter (Addendum)
Case Number [id number]. Ins ID [id number] Group [id number] CPT 02111 (MRI of Joint w/) [employer] [phone]   Please provide more clinical information, patient is scheduled for tomorrow

## 2014-03-11 ENCOUNTER — Ambulatory Visit
Admission: RE | Admit: 2014-03-11 | Discharge: 2014-03-11 | Disposition: A | Discharge status: Home / Self Care | Payer: 59 | Source: Ambulatory Visit | Attending: Family Medicine | Admitting: Family Medicine

## 2014-03-11 ENCOUNTER — Telehealth: Payer: Self-pay | Admitting: Family Medicine

## 2014-03-11 ENCOUNTER — Other Ambulatory Visit: Payer: Self-pay | Admitting: Family Medicine

## 2014-03-11 DIAGNOSIS — M329 Systemic lupus erythematosus, unspecified: Secondary | ICD-10-CM

## 2014-03-11 DIAGNOSIS — M25452 Effusion, left hip: Secondary | ICD-10-CM

## 2014-03-11 DIAGNOSIS — M25552 Pain in left hip: Secondary | ICD-10-CM

## 2014-03-11 DIAGNOSIS — M25451 Effusion, right hip: Secondary | ICD-10-CM

## 2014-03-11 NOTE — Telephone Encounter (Addendum)
I have reordered DG Fluoro Guide Ndl Plc/BX to get effusion aspirated from L hip. This is the same procedure ordered by Dr. Durward Fortes (Manson Passey) in April 2014 (05/01/2012 ) and again in May 2014 (05/26/2012). I have ordered gram stain, cell count + diff, glucose and culture.   As per Michelle's note, the insurance rep that I spoke w/ today (03/11/2014) was given information about the previous 2014 procedure; I explained that I was trying to get PA for the same procedure to aspirate a hip joint. I explained that pt already had MRI showing joint effusions. It is apparent that she did not understand what I was ordering and proceeded to give me the wrong "notification number". This resulted in the test that I ordered being cancelled and an incorrect study being authorized. This has resulted in the pt losing a day of work, through no fault of ours.

## 2014-03-11 NOTE — Telephone Encounter (Signed)
Spoke with Maudie Mercury at [employer] on pt for aspiration of left hip is incorrect. It was ordered as MR Left Hip Arthrogram? And the order/test as recommended by the insurance person was incorrect.  It should be DG Flouro guided needle placement/Bx. Also indicate if labs are needed or not. Pt had the same procedure done on 05/01/12. Dr. Leward Quan is requesting this same referral.  For General Scheduling at [facility]= [phone].  Spoke with pt and she is frustrated and upset that she "missed a days work and received an occurrence". She wanted to know if it was possible to go ahead and get the aspiration done today. I apologized and explained to her that the person from the insurance co for authorization misunderstood the procedure that was needed as it was ordered incorrectly and another authorization was required. She understood.

## 2014-03-11 NOTE — Telephone Encounter (Signed)
I spoke with case reviewer at [facility]. For CPT 60479-  NOTIFICATION : [place] [id number]- [id number].  Please provide this information to [facility] re: MRI procedure for this pt.  Thank you.

## 2014-03-11 NOTE — Telephone Encounter (Signed)
I called to give clinical information but this case still needs a peer to peer [phone] ref [id number]. Dr. Leward Quan has to call.

## 2014-03-28 ENCOUNTER — Telehealth: Payer: Self-pay

## 2014-03-28 MED ORDER — OXYCODONE-ACETAMINOPHEN 5-325 MG PO TABS: 1.0000 | ORAL_TABLET | Freq: Four times a day (QID) | ORAL | Status: DC | PRN

## 2014-03-28 NOTE — Telephone Encounter (Signed)
RX for Percocet can be picked up at [address] on Tuesday morning (March 8,2016).

## 2014-03-28 NOTE — Telephone Encounter (Signed)
PATIENT WOULD LIKE DR. MCPHERSON TO GIVE HER A REFILL ON PERCOCET 5/325 MG. PLEASE CALL HER WHEN THE PRESCRIPTION CAN BE PICKED UP. BEST PHONE [phone] (CELL)   [place]

## 2014-03-29 NOTE — Telephone Encounter (Signed)
Called pt, advised Rx ready to pick up.

## 2014-04-20 ENCOUNTER — Ambulatory Visit (INDEPENDENT_AMBULATORY_CARE_PROVIDER_SITE_OTHER): Payer: 59 | Admitting: Family Medicine

## 2014-04-20 ENCOUNTER — Encounter: Payer: Self-pay | Admitting: Family Medicine

## 2014-04-20 VITALS — BP 112/78 | HR 96 | Temp 98.2°F | Resp 16 | Ht 61.0 in | Wt 142.2 lb

## 2014-04-20 DIAGNOSIS — K2970 Gastritis, unspecified, without bleeding: Secondary | ICD-10-CM

## 2014-04-20 DIAGNOSIS — M2550 Pain in unspecified joint: Secondary | ICD-10-CM

## 2014-04-20 DIAGNOSIS — R635 Abnormal weight gain: Secondary | ICD-10-CM | POA: Diagnosis not present

## 2014-04-20 DIAGNOSIS — R269 Unspecified abnormalities of gait and mobility: Secondary | ICD-10-CM

## 2014-04-20 DIAGNOSIS — K5901 Slow transit constipation: Secondary | ICD-10-CM | POA: Diagnosis not present

## 2014-04-20 DIAGNOSIS — T50905A Adverse effect of unspecified drugs, medicaments and biological substances, initial encounter: Secondary | ICD-10-CM

## 2014-04-20 MED ORDER — OXYCODONE-ACETAMINOPHEN 5-325 MG PO TABS: 1.0000 | ORAL_TABLET | Freq: Four times a day (QID) | ORAL | Status: DC | PRN

## 2014-04-20 MED ORDER — FAMOTIDINE 20 MG PO TABS: 20.0000 mg | ORAL_TABLET | Freq: Two times a day (BID) | ORAL | Status: DC

## 2014-04-20 NOTE — Telephone Encounter (Signed)
I saw pt today; she still has bilateral hip pain and limps when walking due to L > R hip pain. I discussed referral back to [facility] (Dr. Joni Fears) to get joint aspiration ordered and to be performed by Dr. Jobe Igo at [facility]. I think this procedure can be authorized more quickly if it comes from [facility].

## 2014-04-20 NOTE — Patient Instructions (Addendum)
Gastritis, Adult Gastritis is soreness and puffiness (inflammation) of the lining of the stomach. If you do not get help, gastritis can cause bleeding and sores (ulcers) in the stomach. HOME CARE   Only take medicine as told by your doctor.  If you were given antibiotic medicines, take them as told. Finish the medicines even if you start to feel better.  Drink enough fluids to keep your pee (urine) clear or pale yellow.  Avoid foods and drinks that make your problems worse. Foods you may want to avoid include:  Caffeine or alcohol.  Chocolate.  Mint.  Garlic and onions.  Spicy foods.  Citrus fruits, including oranges, lemons, or limes.  Food containing tomatoes, including sauce, chili, salsa, and pizza.  Fried and fatty foods.  Eat small meals throughout the day instead of large meals. GET HELP RIGHT AWAY IF:   You have black or dark red poop (stools).  You throw up (vomit) blood. It may look like coffee grounds.  You cannot keep fluids down.  Your belly (abdominal) pain gets worse.  You have a fever.  You do not feel better after 1 week.  You have any other questions or concerns. MAKE SURE YOU:   Understand these instructions.  Will watch your condition.  Will get help right away if you are not doing well or get worse. Document Released: 06/26/2007 Document Revised: 04/01/2011 Document Reviewed: 02/20/2011 [facility] Patient Information 2015 [place], [place]. This information is not intended to replace advice given to you by your health care provider. Make sure you discuss any questions you have with your health care provider.   TAKE ALL DOSES OF PREDNISONE WITH FOOD OR SNACK.  TAKE METHOTREXATE LATER IN THE MORNING AFTER YOU HAVE BEEN UP AND HAD BREAKFAST. TAKE THE MEDICATION I HAVE PRESCRIBED FOR YOUR STOMACH; IT IS A TWICE -A-DAY MEDICATION TO REDUCE STOMACH ACID.    Constipation Constipation is when a person:  Poops (has a bowel movement) less than 3  times a week.  Has a hard time pooping.  Has poop that is dry, hard, or bigger than normal. HOME CARE   Eat foods with a lot of fiber in them. This includes fruits, vegetables, beans, and whole grains such as brown rice.  Avoid fatty foods and foods with a lot of sugar. This includes french fries, hamburgers, cookies, candy, and soda.  If you are not getting enough fiber from food, take products with added fiber in them (supplements).  Drink enough fluid to keep your pee (urine) clear or pale yellow.  Exercise on a regular basis, or as told by your doctor.  Go to the restroom when you feel like you need to poop. Do not hold it.  Only take medicine as told by your doctor. Do not take medicines that help you poop (laxatives) without talking to your doctor first. GET HELP RIGHT AWAY IF:   You have bright red blood in your poop (stool).  Your constipation lasts more than 4 days or gets worse.  You have belly (abdominal) or butt (rectal) pain.  You have thin poop (as thin as a pencil).  You lose weight, and it cannot be explained. MAKE SURE YOU:   Understand these instructions.  Will watch your condition.  Will get help right away if you are not doing well or get worse. Document Released: 06/26/2007 Document Revised: 01/12/2013 Document Reviewed: 10/19/2012 [facility] Patient Information 2015 [place], [place]. This information is not intended to replace advice given to you by your  health care provider. Make sure you discuss any questions you have with your health care provider.

## 2014-04-20 NOTE — Progress Notes (Signed)
S:  This 29 y.o. Female has Lupus, managed by , Dr. Kittie Plater, a rheumatologist at [facility] (last visit ~ 03/31/14). Pt now taking Methotrexate 2.5 mg, 4 tabs once a week. She is taking Prednisone 10 mg daily, usually on an empty stomach. She c/o nausea and epig abd discomfort and "queasy feeling". She has vomited once. Chronic constipation is a problem also; pt ran out of Miralax. Onset  Of symptoms ~ 2 -3 weeks ago.  Pt denies fever/chills, diaphoresis, sore throat, drooling,HA, dizziness, PND, trouble swallowing, chest tightness, SOB, CP, diarrhea, skin color change, swollen lymph nodes, HA, dizziness or syncope. She has gained weight due to Prednisone.  We briefly discussed chronic hip effusions and pain, L >>R. Continues to limp when walking. Awaiting insurer coverage for aspiration of effusion.   Patient Active Problem List   Diagnosis Date Noted  . Depression with anxiety 08/06/2013  . Uses oral contraception 07/01/2012  . Lupus 08/09/2011  . Joint pain 07/23/2011    Prior to Admission medications   Medication Sig Start Date End Date Taking? Authorizing Provider  DULoxetine (CYMBALTA) 20 MG capsule take 1 capsule by mouth once daily 12/01/13  Yes Barton Fanny, MD  methotrexate (RHEUMATREX) 2.5 MG tablet Take 2.5 mg by mouth once a week. Caution:Chemotherapy. Protect from light.   Yes Historical Provider, MD  Norgestim-Eth Radene Journey Triphasic (ORTHO TRI-CYCLEN LO PO) Take by mouth daily.   Yes Historical Provider, MD  oxyCODONE-acetaminophen (PERCOCET/ROXICET) 5-325 MG per tablet Take 1 tablet by mouth every 6 (six) hours as needed for severe pain. 04/20/14  Yes Barton Fanny, MD  polyethylene glycol powder (GLYCOLAX/MIRALAX) powder Take 17 g by mouth 2 (two) times daily as needed. 07/21/13  Yes Barton Fanny, MD  predniSONE (DELTASONE) 20 MG tablet Take tablets as directed with snack or meal. 07/23/13  Yes Barton Fanny, MD  acetaminophen (TYLENOL)  500 MG tablet Take 500 mg by mouth every 6 (six) hours as needed.    Historical Provider, MD  famotidine (PEPCID) 20 MG tablet Take 1 tablet (20 mg total) by mouth 2 (two) times daily. 04/20/14   Barton Fanny, MD  ibuprofen (ADVIL,MOTRIN) 200 MG tablet Take 200 mg by mouth every 6 (six) hours as needed.    Historical Provider, MD    History reviewed. No pertinent past surgical history.   SOC and FAM HX reviewed.  ROS: As per HPI.  O: Filed Vitals:   04/20/14 1053  BP: 112/78  Pulse: 96  Temp: 98.2 F (36.8 C)  Resp: 16    GEN: In NAD; WN,WD.  Weight up 25 lbs since Aug 2015. HENT: [place]/AT; EOMI w/ clear conj/sclerae. Ext ears/EACs/TMs normal. Post ph clear and moist w/o erythema or lesions. NECK: SUpple w/o LAN or TNG. COR: RRR. Normal S1 and S2. No /g/r. LUNGS: CTA. BACK: Paravertebral muscle tenderness w/ moderate palpation. NEURO: A&O x 3; CNs intact. Gait- antalgic, favoring L leg. Otherwise unremarkable. PSYCH: Pleasant and calm, conversant and attentive. Speech pattern and thought content appropriate. Judgement sound.  A/P: Gastritis-  RX: Pepcid 20 mg 1 tab bid.  Take Prednisone w/ food or snack. Take Methotrexate later in the day, once a week, after you have been up and consumed a meal.  Avoid taking medications on empty stomach unless label so directs; take all medications w/ full glass of water.  Slow transit constipation- Resume Miralax; take it daily for 14 days straight. Try to increase fiber intake and physical activity,  as tolerated.  Joint pain- Follow-up w/ Dr. Maralyn Sago as scheduled. RX: Oxycodone. Refer to Dr. Durward Fortes for evaluation.  Abnormality of gait- Will attempt to get joint aspiration approved w/ insurer; previous procedure ordered by [facility] specialist. Advised pt that a referral to [facility] may expedite process; she saw Dr. Durward Fortes in 2014 resulting in referral for joint aspiration by Dr. Jobe Igo.  Weight gain due to medication- Prednisone  side-effect.   Meds ordered this encounter  Medications  . famotidine (PEPCID) 20 MG tablet    Sig: Take 1 tablet (20 mg total) by mouth 2 (two) times daily.    Dispense:  60 tablet    Refill:  5  . oxyCODONE-acetaminophen (PERCOCET/ROXICET) 5-325 MG per tablet    Sig: Take 1 tablet by mouth every 6 (six) hours as needed for severe pain.    Dispense:  50 tablet    Refill:  0

## 2014-05-06 ENCOUNTER — Telehealth: Payer: Self-pay

## 2014-05-06 NOTE — Telephone Encounter (Signed)
Dr Leward Quan   Refill request for perocet  [facility] -   Saw Dr. Garnette Czech today cleared for surgery   [phone]

## 2014-05-07 ENCOUNTER — Other Ambulatory Visit: Payer: Self-pay | Admitting: Family Medicine

## 2014-05-07 MED ORDER — OXYCODONE-ACETAMINOPHEN 5-325 MG PO TABS: 1.0000 | ORAL_TABLET | Freq: Four times a day (QID) | ORAL | Status: DC | PRN

## 2014-05-07 NOTE — Progress Notes (Signed)
Oxycodone- acetaminophen prescription will be at [address] for pick-up on Sunday afternoon.

## 2014-05-07 NOTE — Telephone Encounter (Signed)
Pain med prescription will be at [address] for pick-up on Sunday afternoon (May 08, 2014)

## 2014-05-08 ENCOUNTER — Telehealth: Payer: Self-pay

## 2014-05-08 NOTE — Telephone Encounter (Signed)
Patient called to see if her medication is ready. The medication wasn't found in the pick up drawer. Please call when ready for pick up! [phone]

## 2014-05-08 NOTE — Telephone Encounter (Signed)
Please let us know when this is ready. Wasn't sure what time to give her earlier and she stopped by for it. Thanks

## 2014-05-08 NOTE — Telephone Encounter (Signed)
Pt.notified

## 2014-05-08 NOTE — Progress Notes (Signed)
See other phone message  

## 2014-05-08 NOTE — Telephone Encounter (Signed)
Pt informed that her RX is ready but will be at [address] for pick-up on Monday, May 09, 2014. She brought me up-to-date about her consultation w/ Dr. Durward Fortes. [facility] w/ be calling her w/ an appt for joint aspiration and he has mentioned that hip replacement may be in her future.

## 2014-05-11 ENCOUNTER — Other Ambulatory Visit: Payer: Self-pay | Admitting: Orthopaedic Surgery

## 2014-05-11 DIAGNOSIS — M25552 Pain in left hip: Secondary | ICD-10-CM

## 2014-05-24 ENCOUNTER — Telehealth: Payer: Self-pay

## 2014-05-24 ENCOUNTER — Ambulatory Visit
Admission: RE | Admit: 2014-05-24 | Discharge: 2014-05-24 | Disposition: A | Discharge status: Home / Self Care | Payer: 59 | Source: Ambulatory Visit | Attending: Orthopaedic Surgery | Admitting: Orthopaedic Surgery

## 2014-05-24 ENCOUNTER — Other Ambulatory Visit: Payer: Self-pay | Admitting: Orthopaedic Surgery

## 2014-05-24 DIAGNOSIS — M25552 Pain in left hip: Secondary | ICD-10-CM

## 2014-05-24 LAB — SYNOVIAL CELL COUNT + DIFF, W/ CRYSTALS
CRYSTALS FLUID: NONE SEEN
Eosinophils-Synovial: 2 % — ABNORMAL HIGH (ref 0–1)
Lymphocytes-Synovial Fld: 8 % (ref 0–20)
MONOCYTE/MACROPHAGE: 0 % — AB (ref 50–90)
Neutrophil, Synovial: 90 % — ABNORMAL HIGH (ref 0–25)

## 2014-05-24 LAB — SYNOVIAL FLUID, CRYSTAL: Crystals, Fluid: NONE SEEN

## 2014-05-24 MED ORDER — IOHEXOL 180 MG/ML  SOLN
1.0000 mL | Freq: Once | INTRAMUSCULAR | Status: AC | PRN
  Administered 2014-05-24: 1 mL via INTRA_ARTICULAR

## 2014-05-24 MED ORDER — OXYCODONE-ACETAMINOPHEN 5-325 MG PO TABS: 1.0000 | ORAL_TABLET | Freq: Four times a day (QID) | ORAL | Status: DC | PRN

## 2014-05-24 NOTE — Telephone Encounter (Signed)
Pt states she is in need of her PERCOCET 5-325 MG. Please call [phone] when ready

## 2014-05-24 NOTE — Telephone Encounter (Signed)
Prescription ready for pick-up at [address]; signature required.

## 2014-05-25 NOTE — Telephone Encounter (Signed)
Patient will come in today to pick up RX

## 2014-05-27 LAB — BODY FLUID CULTURE
Gram Stain: NONE SEEN
ORGANISM ID, BACTERIA: NO GROWTH

## 2014-05-30 ENCOUNTER — Ambulatory Visit
Admission: RE | Admit: 2014-05-30 | Discharge: 2014-05-30 | Disposition: A | Discharge status: Home / Self Care | Payer: 59 | Source: Ambulatory Visit | Attending: Orthopaedic Surgery | Admitting: Orthopaedic Surgery

## 2014-05-30 ENCOUNTER — Telehealth: Payer: Self-pay

## 2014-05-30 DIAGNOSIS — M25552 Pain in left hip: Secondary | ICD-10-CM

## 2014-05-30 MED ORDER — METHYLPREDNISOLONE ACETATE 40 MG/ML INJ SUSP (RADIOLOG
120.0000 mg | Freq: Once | INTRAMUSCULAR | Status: AC
  Administered 2014-05-30: 120 mg via INTRA_ARTICULAR

## 2014-05-30 MED ORDER — IOHEXOL 180 MG/ML  SOLN
1.0000 mL | Freq: Once | INTRAMUSCULAR | Status: AC | PRN
  Administered 2014-05-30: 1 mL via INTRA_ARTICULAR

## 2014-05-30 NOTE — Telephone Encounter (Signed)
Patient is calling because she needs a referral to see Dr. Durward Fortes at [facility]. Please call patient! [phone]

## 2014-05-30 NOTE — Telephone Encounter (Signed)
Called pt, she needs a referral Medical laboratory scientific officer to [employer].

## 2014-05-31 NOTE — Telephone Encounter (Signed)
Referral has been submitted for [facility] authorization for [facility] Ortho Dr Joni Fears Auth #[id number]. I tried contacting patient but no answer, I have faxed the authorization to [place] [phone]

## 2014-06-14 ENCOUNTER — Telehealth: Payer: Self-pay

## 2014-06-14 NOTE — Telephone Encounter (Signed)
Pt in need of her Percocet 5-325 MG. Please call [phone] when ready for pick up

## 2014-06-15 MED ORDER — OXYCODONE-ACETAMINOPHEN 5-325 MG PO TABS: 1.0000 | ORAL_TABLET | Freq: Four times a day (QID) | ORAL | Status: DC | PRN

## 2014-06-15 NOTE — Telephone Encounter (Signed)
Pt.notified

## 2014-06-15 NOTE — Telephone Encounter (Signed)
The prescription for Percocet is at [address] for pick-up today (Wednesday). Signature is required.

## 2014-07-12 ENCOUNTER — Other Ambulatory Visit: Payer: Self-pay | Admitting: Orthopaedic Surgery

## 2014-07-12 DIAGNOSIS — M1611 Unilateral primary osteoarthritis, right hip: Secondary | ICD-10-CM

## 2014-07-18 ENCOUNTER — Ambulatory Visit
Admission: RE | Admit: 2014-07-18 | Discharge: 2014-07-18 | Disposition: A | Discharge status: Home / Self Care | Payer: 59 | Source: Ambulatory Visit | Attending: Orthopaedic Surgery | Admitting: Orthopaedic Surgery

## 2014-07-18 DIAGNOSIS — M1611 Unilateral primary osteoarthritis, right hip: Secondary | ICD-10-CM

## 2014-07-18 MED ORDER — IOHEXOL 180 MG/ML  SOLN
1.0000 mL | Freq: Once | INTRAMUSCULAR | Status: AC | PRN
  Administered 2014-07-18: 1 mL via INTRA_ARTICULAR

## 2014-07-18 MED ORDER — METHYLPREDNISOLONE ACETATE 40 MG/ML INJ SUSP (RADIOLOG
120.0000 mg | Freq: Once | INTRAMUSCULAR | Status: AC
  Administered 2014-07-18: 120 mg via INTRA_ARTICULAR

## 2014-08-15 ENCOUNTER — Encounter: Payer: Self-pay | Admitting: Family Medicine

## 2014-08-15 ENCOUNTER — Ambulatory Visit (INDEPENDENT_AMBULATORY_CARE_PROVIDER_SITE_OTHER): Payer: 59 | Admitting: Family Medicine

## 2014-08-15 VITALS — BP 124/87 | HR 84 | Temp 98.7°F | Resp 16 | Ht 62.0 in | Wt 147.0 lb

## 2014-08-15 DIAGNOSIS — Z13220 Encounter for screening for lipoid disorders: Secondary | ICD-10-CM | POA: Diagnosis not present

## 2014-08-15 DIAGNOSIS — F32A Depression, unspecified: Secondary | ICD-10-CM

## 2014-08-15 DIAGNOSIS — Z131 Encounter for screening for diabetes mellitus: Secondary | ICD-10-CM | POA: Diagnosis not present

## 2014-08-15 DIAGNOSIS — F329 Major depressive disorder, single episode, unspecified: Secondary | ICD-10-CM | POA: Diagnosis not present

## 2014-08-15 DIAGNOSIS — R635 Abnormal weight gain: Secondary | ICD-10-CM

## 2014-08-15 DIAGNOSIS — Z0000 Encounter for general adult medical examination without abnormal findings: Secondary | ICD-10-CM

## 2014-08-15 DIAGNOSIS — M329 Systemic lupus erythematosus, unspecified: Secondary | ICD-10-CM | POA: Diagnosis not present

## 2014-08-15 DIAGNOSIS — Z8639 Personal history of other endocrine, nutritional and metabolic disease: Secondary | ICD-10-CM

## 2014-08-15 LAB — HEMOGLOBIN A1C
Hgb A1c MFr Bld: 5.1 % (ref <5.7)
Mean Plasma Glucose: 100 mg/dL (ref <117)

## 2014-08-15 LAB — LIPID PANEL
CHOL/HDL RATIO: 1.8 ratio (ref <=5.0)
CHOLESTEROL: 159 mg/dL (ref 125–200)
HDL: 86 mg/dL (ref >=46)
LDL Cholesterol: 56 mg/dL (ref <130)
TRIGLYCERIDES: 86 mg/dL (ref <150)
VLDL: 17 mg/dL (ref <30)

## 2014-08-15 LAB — TSH: TSH: 0.778 u[IU]/mL (ref 0.350–4.500)

## 2014-08-15 MED ORDER — TRAMADOL HCL 50 MG PO TABS: 50.0000 mg | ORAL_TABLET | Freq: Three times a day (TID) | ORAL | Status: DC | PRN

## 2014-08-15 MED ORDER — DULOXETINE HCL 30 MG PO CPEP: 30.0000 mg | ORAL_CAPSULE | Freq: Every day | ORAL | Status: DC

## 2014-08-15 NOTE — Patient Instructions (Signed)
Good to see you today!  I will be in touch with your labs I am sorry that you are having such a hard time with your current situation; your feelings are certainly understandable Please look into lupus support groups in this area.  Talking to others going through this situation may be really helpful for you. Let's also get you back on the cymbalta.  Start with 30 mg and go up to 60 mg after a week You can try the tramadol as needed for pain.  This is a mild narcotic and can be habit forming and sedating.  Use it only for severe pain- alternate with tylenol when you can.    Please come and see me in about 2 months for a recheck- Sooner if worse.

## 2014-08-15 NOTE — Progress Notes (Addendum)
[facility] [address], [place] [address] [phone]  Date:  08/15/2014   Name:  Rose Campbell   DOB:  [birth year]   MRN:  [id number]  PCP:  No primary care provider on file.    Chief Complaint: Annual Exam and Depression   History of Present Illness:  Rose Campbell is a 29 y.o. very pleasant female patient who presents with the following:  Young lady with history of lupus, here today for a CPE.   Last labs in our system about one year ago. However she does have regular lab per her rheumatologist She was dx with lupus in 2013; her rheum is at [facility], Dr. Cecille Campbell.  Methotrexate weekly, prednisone 10 mg daily.  They are trying to wean her off of prednisone but this is going slowly.  She "hates taking this so much."   Her last rheum visit was a month ago.  She is not sure if they check her CHL and would like to do so today She is fasting today  She has joint problems and struggles with her weight related to her prednisone. She notes that her face has changed shape with this drug- hates this.  She admits that she is feeling upset and sad most of the time.  She has felt this way for the last 2-3 years.  She is not sleeping well at night, will wake up too early sometimes.  No SI or HI however.  She works in Therapist, art, and plans to return to school to study fashion next month.  She tries to watch her diet, but the prednisone makes this hard.  She notes stretch marks, and has always been thin, this makes her weight gain very hard on her.    She had been on cymbalta until a few months ago when she ran out and stopped taking it- she has noted worsening of her sx since she stopped using this Menses started today She also notes chronic joint pains.  Tylenol is not enough and no NSAIDs due to pred.  She has used percocet on occasion in the past.  Wonders what else might be helpful for her   Wt Readings from Last 3 Encounters:  08/15/14 147 lb (66.679 kg)   04/20/14 142 lb 3.2 oz (64.501 kg)  08/27/13 117 lb 6.4 oz (53.252 kg)   She sees OB-GYn for her paps.  She is on OCP Patient Active Problem List   Diagnosis Date Noted  . Depression with anxiety 08/06/2013  . Uses oral contraception 07/01/2012  . Lupus 08/09/2011  . Joint pain 07/23/2011    Past Medical History  Diagnosis Date  . Lupus   . Polyarthralgia     History reviewed. No pertinent past surgical history.  History  Substance Use Topics  . Smoking status: Never Smoker   . Smokeless tobacco: Not on file  . Alcohol Use: No    History reviewed. No pertinent family history.  No Known Allergies  Medication list has been reviewed and updated.  Current Outpatient Prescriptions on File Prior to Visit  Medication Sig Dispense Refill  . methotrexate (RHEUMATREX) 2.5 MG tablet Take 2.5 mg by mouth once a week. Caution:Chemotherapy. Protect from light.    Lenard Forth Triphasic (ORTHO TRI-CYCLEN LO Campbell) Take by mouth daily.    Marland Kitchen oxyCODONE-acetaminophen (PERCOCET/ROXICET) 5-325 MG per tablet Take 1 tablet by mouth every 6 (six) hours as needed for severe pain. 60 tablet 0  . polyethylene glycol  powder (GLYCOLAX/MIRALAX) powder Take 17 g by mouth 2 (two) times daily as needed. 3350 g 1  . predniSONE (DELTASONE) 20 MG tablet Take tablets as directed with snack or meal. 20 tablet 0   No current facility-administered medications on file prior to visit.    Review of Systems:  As per HPI- otherwise negative.   Physical Examination: Filed Vitals:   08/15/14 0825  BP: 124/87  Pulse: 84  Temp: 98.7 F (37.1 C)  Resp: 16   Filed Vitals:   08/15/14 0825  Height: 5\' 2"  (1.575 m)  Weight: 147 lb (66.679 kg)   Body mass index is 26.88 kg/(m^2). Ideal Body Weight: Weight in (lb) to have BMI = 25: 136.4  GEN: WDWN, NAD, Non-toxic, A & O x 3, moon facies, OW looks well HEENT: Atraumatic, Normocephalic. Neck supple. No masses, No LAD.  Bilateral TM wnl, oropharynx  normal.  PEERL,EOMI.   Ears and Nose: No external deformity. CV: RRR, No M/G/R. No JVD. No thrill. No extra heart sounds. PULM: CTA B, no wheezes, crackles, rhonchi. No retractions. No resp. distress. No accessory muscle use. ABD: S, NT, ND. No rebound. No HSM. EXTR: No c/c/e NEURO Normal gait.  PSYCH: Normally interactive. Conversant. Not depressed or anxious appearing.  Calm demeanor.    Assessment and Plan: Physical exam  Depression - Plan: DULoxetine (CYMBALTA) 30 MG capsule  Weight gain - Plan: Lipid panel, TSH, Hemoglobin A1c  History of vitamin D deficiency - Plan: Vitamin D, 25-hydroxy  Screening for hyperlipidemia - Plan: Lipid panel  Screening for diabetes mellitus - Plan: Hemoglobin A1c  Lupus (systemic lupus erythematosus) - Plan: traMADol (ULTRAM) 50 MG tablet  Start back on cymbalta for her depression.  Also encouraged her to try a support group and/ or counseling.  She will also discuss the prednisone with her rheum, ask if there is any ways she can stop this at some point in the future Tramadol prn for joint pain, use on occasion.  Cautioned regarding risk of seratonin syndrome and sx to watch out for She seen GYn for her pap and breast exams Low vit D in the past- screen for this Other labs as above Will plan further follow- up pending labs.    Meds ordered this encounter  Medications  . folic acid (FOLVITE) 1 MG tablet    Sig: Take 1 mg by mouth daily.  . famotidine (PEPCID AC) 10 MG chewable tablet    Sig: Chew 10 mg by mouth 2 (two) times daily.  . DULoxetine (CYMBALTA) 30 MG capsule    Sig: Take 1 capsule (30 mg total) by mouth daily. Increase to 2 tablets daily after a week    Dispense:  60 capsule    Refill:  3  . traMADol (ULTRAM) 50 MG tablet    Sig: Take 1 tablet (50 mg total) by mouth every 8 (eight) hours as needed.    Dispense:  30 tablet    Refill:  0     Signed Rose Blinks, MD  Called and [facility] 7/26- except for low vitamin D her  labs look good. Will send her a letter rx for vitamin D 50K weekly for 12 weeks sent to pharmacy

## 2014-08-16 ENCOUNTER — Encounter: Payer: Self-pay | Admitting: Family Medicine

## 2014-08-16 LAB — VITAMIN D 25 HYDROXY (VIT D DEFICIENCY, FRACTURES): Vit D, 25-Hydroxy: 10 ng/mL — ABNORMAL LOW (ref 30–100)

## 2014-08-16 MED ORDER — VITAMIN D (ERGOCALCIFEROL) 1.25 MG (50000 UNIT) PO CAPS: 50000.0000 [IU] | ORAL_CAPSULE | ORAL | Status: DC

## 2014-08-16 NOTE — Addendum Note (Signed)
Addended by: Lamar Blinks C on: 08/16/2014 09:16 AM   Modules accepted: Orders

## 2014-09-09 ENCOUNTER — Other Ambulatory Visit: Payer: Self-pay

## 2014-09-09 DIAGNOSIS — F329 Major depressive disorder, single episode, unspecified: Secondary | ICD-10-CM

## 2014-09-09 DIAGNOSIS — M329 Systemic lupus erythematosus, unspecified: Secondary | ICD-10-CM

## 2014-09-09 DIAGNOSIS — F32A Depression, unspecified: Secondary | ICD-10-CM

## 2014-09-09 NOTE — Telephone Encounter (Signed)
PT called stating rx is in need of a refill forTramadol. PT also stated that she is to take a graduated dose of Cymbalta, but her insurance only covers a 30 day supply, which is less than what Dr. Lorelei Pont has prescribed for treatment, PT will need prior Authorization with insurance company to exceed quantity limit or a Rx re-evaluation by MD. Please contact PT at [phone] to advise.  [phone]

## 2014-09-13 MED ORDER — TRAMADOL HCL 50 MG PO TABS: 50.0000 mg | ORAL_TABLET | Freq: Three times a day (TID) | ORAL | Status: DC | PRN

## 2014-09-13 MED ORDER — DULOXETINE HCL 60 MG PO CPEP: 60.0000 mg | ORAL_CAPSULE | Freq: Every day | ORAL | Status: DC

## 2014-09-13 NOTE — Telephone Encounter (Signed)
I called pt and she reported that the pharm had told her ins would only cover #30 of the 30 mg at a time, so she has just continued to take one 30 mg QD. Since Dr Lorelei Pont had intended her to titrate up to 60 mg after 1 week (and ins will not pay for two of the 30 mg), I will go ahead and send in a Rx for the 60 mg caps w/RFs she had put on orig Rx with note to cancel the remaining 30 mg RFs. Dr Lorelei Pont, can you please address req for RF of tramadol?

## 2014-09-20 ENCOUNTER — Telehealth: Payer: Self-pay | Admitting: Family Medicine

## 2014-09-20 NOTE — Telephone Encounter (Signed)
Dr. Lorelei Pont, do you know what pt is referring to?

## 2014-09-20 NOTE — Telephone Encounter (Signed)
Patient request for Dr. Lorelei Pont to complete forms to extend her ADA accomodation of reviewing 3 sick days per week. Patient attached a note explaining everything. Patient request the forms to be completed by Friday. Please call the patient at [phone]

## 2014-09-21 NOTE — Telephone Encounter (Signed)
Done today.

## 2014-10-19 ENCOUNTER — Encounter: Payer: Self-pay | Admitting: Family Medicine

## 2014-10-19 ENCOUNTER — Ambulatory Visit (INDEPENDENT_AMBULATORY_CARE_PROVIDER_SITE_OTHER): Payer: 59 | Admitting: Family Medicine

## 2014-10-19 VITALS — BP 128/84 | HR 98 | Temp 98.3°F | Resp 16 | Wt 146.0 lb

## 2014-10-19 DIAGNOSIS — F330 Major depressive disorder, recurrent, mild: Secondary | ICD-10-CM

## 2014-10-19 DIAGNOSIS — F32A Depression, unspecified: Secondary | ICD-10-CM

## 2014-10-19 DIAGNOSIS — F329 Major depressive disorder, single episode, unspecified: Secondary | ICD-10-CM

## 2014-10-19 DIAGNOSIS — R635 Abnormal weight gain: Secondary | ICD-10-CM

## 2014-10-19 DIAGNOSIS — T50905A Adverse effect of unspecified drugs, medicaments and biological substances, initial encounter: Secondary | ICD-10-CM

## 2014-10-19 DIAGNOSIS — M329 Systemic lupus erythematosus, unspecified: Secondary | ICD-10-CM | POA: Diagnosis not present

## 2014-10-19 MED ORDER — PREDNISONE 20 MG PO TABS: ORAL_TABLET | ORAL | Status: DC

## 2014-10-19 MED ORDER — DULOXETINE HCL 60 MG PO CPEP: 60.0000 mg | ORAL_CAPSULE | Freq: Two times a day (BID) | ORAL | Status: DC

## 2014-10-19 NOTE — Patient Instructions (Signed)
We will try increasing your cymbalta to 60 mg twice a day (120 mg total) If this does not seem to give you any additional benefit you can go back to 60 mg We will set up your appt with Dr. Trudie Reed- if it will be more than a month before your appt please let me know and I will ask her about getting you back on methotrexate.   Remember that cymbalta combined with tramadol can increase your risk of serotonin syndrome- this is a rare disorder that can cause muscle twitching, sweating, diarrhea, and feeling bad overall.  If you have any symptoms like this please let us know right away

## 2014-10-19 NOTE — Progress Notes (Signed)
[facility] [address] [place] [address] [phone]  Date:  10/19/2014   Name:  Rose Campbell   DOB:  04-Nov-1985   MRN:  [id number]  PCP:  No primary care provider on file.    Chief Complaint: Depression and Lupus   History of Present Illness:  Rose Campbell is a 29 y.o. very pleasant female patient who presents with the following:  Here today to follow-up- I saw her in July, at that time we discussed her depression and chronic pain, much of which is related to her lupus and related treatment.  We put her back on cymbalta, and tried tramadol as needed for pains.  Also noted low vitamin D and gave her an rx for this.    She has more joint pains when the weather changes- it rained this week a good bit and she has noted increased sx  She may use the tramadol q 8 hours as needed- she takes it at night after work daily.  She may use it during the day every other day.   She does need a RF of her prednisone- she takes 10 mg daily.  She has been on prednisone for some time and continues to be frustrated by weight gain and inability to lose weight while on this med She is on 60 mg of cymbalta- she does feel like it is helping with her mood  Formerly a rheumatology pt at [facility].  However she would like to see someone closer to home- requests referral to see Dr. Trudie Reed who is a rheum in [place]. She has been off methotrexate for about one month now.  [facility] did not want to refill it since she was not actively being seen.    She does not like to get flu shots  Wt Readings from Last 3 Encounters:  10/19/14 146 lb (66.225 kg)  08/15/14 147 lb (66.679 kg)  04/20/14 142 lb 3.2 oz (64.501 kg)     Patient Active Problem List   Diagnosis Date Noted  . Depression with anxiety 08/06/2013  . Uses oral contraception 07/01/2012  . Lupus 08/09/2011  . Joint pain 07/23/2011    Past Medical History  Diagnosis Date  . Lupus   . Polyarthralgia     No past  surgical history on file.  Social History  Substance Use Topics  . Smoking status: Never Smoker   . Smokeless tobacco: None  . Alcohol Use: No    No family history on file.  Not on File  Medication list has been reviewed and updated.  Current Outpatient Prescriptions on File Prior to Visit  Medication Sig Dispense Refill  . DULoxetine (CYMBALTA) 60 MG capsule Take 1 capsule (60 mg total) by mouth daily. 30 capsule 3  . famotidine (PEPCID AC) 10 MG chewable tablet Chew 10 mg by mouth 2 (two) times daily.    Lenard Forth Triphasic (ORTHO TRI-CYCLEN LO PO) Take by mouth daily.    . polyethylene glycol powder (GLYCOLAX/MIRALAX) powder Take 17 g by mouth 2 (two) times daily as needed. 3350 g 1  . predniSONE (DELTASONE) 20 MG tablet Take tablets as directed with snack or meal. 20 tablet 0  . traMADol (ULTRAM) 50 MG tablet Take 1 tablet (50 mg total) by mouth every 8 (eight) hours as needed. 60 tablet 1  . Vitamin D, Ergocalciferol, (DRISDOL) 50000 UNITS CAPS capsule Take 1 capsule (50,000 Units total) by mouth every 7 (seven) days. 12 capsule 0  .  folic acid (FOLVITE) 1 MG tablet Take 1 mg by mouth daily.    . methotrexate (RHEUMATREX) 2.5 MG tablet Take 2.5 mg by mouth once a week. Caution:Chemotherapy. Protect from light.     No current facility-administered medications on file prior to visit.    Review of Systems:  As per HPI- otherwise negative.   Physical Examination: Filed Vitals:   10/19/14 1010  BP: 128/84  Pulse: 98  Temp: 98.3 F (36.8 C)  Resp: 16   Filed Vitals:   10/19/14 1010  Weight: 146 lb (66.225 kg)   Body mass index is 26.7 kg/(m^2). Ideal Body Weight:    GEN: WDWN, NAD, Non-toxic, A & O x 3, mild overweight HEENT: Atraumatic, Normocephalic. Neck supple. No masses, No LAD. Ears and Nose: No external deformity. CV: RRR, No M/G/R. No JVD. No thrill. No extra heart sounds. PULM: CTA B, no wheezes, crackles, rhonchi. No retractions. No resp.  distress. No accessory muscle use. EXTR: No c/c/e NEURO Normal gait.  PSYCH: Normally interactive. Conversant. Not depressed or anxious appearing.  Calm demeanor.    Assessment and Plan: Lupus (systemic lupus erythematosus) - Plan: Ambulatory referral to Rheumatology, predniSONE (DELTASONE) 20 MG tablet  Depression - Plan: DULoxetine (CYMBALTA) 60 MG capsule  Major depressive disorder, recurrent episode, mild  Weight gain  Refilled her low dose prednisone and did referral to Dr. Trudie Reed as per her request. For now will keep her off methotrexate but if her appt is not for a while I will call and ask DR. Trudie Reed if she wants me to restart this in the meantime Will increase her cymbalta to 60 BID- it not beneficial she will go back down to 60 mg Offered support in her frustration regarding weight gain.  Encouraged her to concentrate on taking good care of herself and eating/ sleeping well, exercising when she can.  Although she is just a bit overweight this is a big change from her former baseline- she estimates that she has gained 40 lbs since starting pred  Meds ordered this encounter  Medications  . predniSONE (DELTASONE) 20 MG tablet    Sig: Take tablets as directed with snack or meal.  Taking 10 mg (1/2 tablet) daily    Dispense:  45 tablet    Refill:  1  . DULoxetine (CYMBALTA) 60 MG capsule    Sig: Take 1 capsule (60 mg total) by mouth 2 (two) times daily.    Dispense:  180 capsule    Refill:  3    .     Signed Lamar Blinks, MD

## 2014-10-20 ENCOUNTER — Telehealth: Payer: Self-pay | Admitting: Family Medicine

## 2014-10-20 NOTE — Telephone Encounter (Signed)
Patient states that her appointment with the rheumatologist is next Wed. She also states that her Cymbalta was the incorrect amount. Please call patient at [phone]

## 2014-10-21 NOTE — Telephone Encounter (Signed)
DULoxetine (CYMBALTA) 60 MG capsule [[id number]]      Order Details    Dose: 60 mg Route: Oral Frequency: 2 times daily   Order Comments:   .        Dispense Quantity:  180 capsule Refills:  3 Fills Remaining:  3          Sig: Take 1 capsule (60 mg total) by mouth 2 (two) times daily.         Written Date:  10/19/14 Expiration Date:  10/19/15     Start Date:  10/19/14 End Date:  --     Ordering Provider:  -- Authorizing Provider:  Darreld Mclean, MD Ordering User:  Darreld Mclean, MD

## 2014-10-21 NOTE — Telephone Encounter (Signed)
Spoke with pt, I advised her that a 90 day supply was sent in. Pt will check with her pharmacy.

## 2014-11-03 ENCOUNTER — Encounter: Payer: Self-pay | Admitting: Family Medicine

## 2014-11-09 ENCOUNTER — Ambulatory Visit (INDEPENDENT_AMBULATORY_CARE_PROVIDER_SITE_OTHER): Payer: 59 | Admitting: Family Medicine

## 2014-11-09 ENCOUNTER — Encounter: Payer: Self-pay | Admitting: Family Medicine

## 2014-11-09 VITALS — BP 150/92 | HR 67 | Temp 98.7°F | Resp 16 | Ht 60.5 in | Wt 145.4 lb

## 2014-11-09 DIAGNOSIS — M25552 Pain in left hip: Secondary | ICD-10-CM

## 2014-11-09 DIAGNOSIS — M25659 Stiffness of unspecified hip, not elsewhere classified: Secondary | ICD-10-CM | POA: Diagnosis not present

## 2014-11-09 MED ORDER — HYDROCODONE-ACETAMINOPHEN 5-325 MG PO TABS: 1.0000 | ORAL_TABLET | Freq: Four times a day (QID) | ORAL | Status: DC | PRN

## 2014-11-09 NOTE — Patient Instructions (Signed)
Try the hydrocodone as needed for pain- I hope that this will help more with your pain Keep me posted on how you are doing with the humira.   Remember the hydrocodone will make you sleepy. Use it for as short a period as you can.

## 2014-11-09 NOTE — Progress Notes (Signed)
[facility] [address] [place] [address] [phone]  Date:  11/09/2014   Name:  Rose Campbell   DOB:  1985-11-26   MRN:  [id number]  PCP:  No primary care provider on file.    Chief Complaint: left hip pain; seen Rheumatalogist on 11/07/14; and Depression scale during triage   History of Present Illness:  Rose Campbell is a 29 y.o. very pleasant female patient who presents with the following:  Here today to follow-up a problem with her hip.  2 days ago she had her first dose of humira from her rheum.  They think she may not have lupus after all! 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  They think it will take about a month to see improvement from the humira She is using tramadol and prednisone- however they plan to taper off her prednisone which is very exciting to her She is also off plaquenil  She has had bilateral hip pain pain off an on for a couple of years now as part of her auto-immune issue.  She has been seen Dr. Donne Hazel at [facility] for intra-articular joint injections in the past- her last shot was in June in her right hip The problem is now in her left hip- it is really painful and is causing her to limp.  No trauma.  Tramadol is not enough to control the pain.  She has needed to use stronger pain meds in the past at time to help with her pain  Also, she notes that she has a chronic limp and is overall weak.  Would like to start PT to work on her strength   Patient Active Problem List   Diagnosis Date Noted  . Weight gain due to medication 10/19/2014  . Depression with anxiety 08/06/2013  . Uses oral contraception 07/01/2012  . Lupus ([place]) 08/09/2011  . Joint pain 07/23/2011    Past Medical History  Diagnosis Date  . Lupus ([place])   . Polyarthralgia     No past surgical history on file.  Social History  Substance Use Topics  . Smoking status: Never Smoker   . Smokeless tobacco: None  . Alcohol Use: No    No  family history on file.  No Known Allergies  Medication list has been reviewed and updated.  Current Outpatient Prescriptions on File Prior to Visit  Medication Sig Dispense Refill  . DULoxetine (CYMBALTA) 60 MG capsule Take 1 capsule (60 mg total) by mouth 2 (two) times daily. 180 capsule 3  . famotidine (PEPCID AC) 10 MG chewable tablet Chew 10 mg by mouth 2 (two) times daily.    . methotrexate (RHEUMATREX) 2.5 MG tablet Take 2.5 mg by mouth once a week. Caution:Chemotherapy. Protect from light.    Lenard Forth Triphasic (ORTHO TRI-CYCLEN LO PO) Take by mouth daily.    . polyethylene glycol powder (GLYCOLAX/MIRALAX) powder Take 17 g by mouth 2 (two) times daily as needed. 3350 g 1  . predniSONE (DELTASONE) 20 MG tablet Take tablets as directed with snack or meal.  Taking 10 mg (1/2 tablet) daily 45 tablet 1  . traMADol (ULTRAM) 50 MG tablet Take 1 tablet (50 mg total) by mouth every 8 (eight) hours as needed. 60 tablet 1  . Vitamin D, Ergocalciferol, (DRISDOL) 50000 UNITS CAPS capsule Take 1 capsule (50,000 Units total) by mouth every 7 (seven) days. 12 capsule 0  . folic acid (FOLVITE) 1 MG tablet Take 1 mg by mouth daily.  No current facility-administered medications on file prior to visit.    Review of Systems:  As per HPI- otherwise negative.   Physical Examination: Filed Vitals:   11/09/14 1534  BP: 150/92  Pulse: 67  Temp: 98.7 F (37.1 C)  Resp: 16   Filed Vitals:   11/09/14 1534  Height: 5' 0.5" (1.537 m)  Weight: 145 lb 6.4 oz (65.953 kg)   Body mass index is 27.92 kg/(m^2). Ideal Body Weight: Weight in (lb) to have BMI = 25: 129.9  GEN: WDWN, NAD, Non-toxic, A & O x 3, looks well and her usual self HEENT: Atraumatic, Normocephalic. Neck supple. No masses, No LAD. Ears and Nose: No external deformity. CV: RRR, No M/G/R. No JVD. No thrill. No extra heart sounds. PULM: CTA B, no wheezes, crackles, rhonchi. No retractions. No resp. distress. No  accessory muscle use. ABD: S, NT, ND, +BS. No rebound. No HSM. EXTR: No c/c/e NEURO  but she has a chronic limp/ uneven stride which is more prominent than usual for her She noted tenderness with movement of the left hip or with use of the left hip flexors.  Normal ROM of the hip however Weakness of the bilateral hip flexors, left is worse PSYCH: Normally interactive. Conversant. Not depressed or anxious appearing.  Calm demeanor.   Assessment and Plan: Left hip pain - Plan: HYDROcodone-acetaminophen (NORCO/VICODIN) 5-325 MG tablet, Ambulatory referral to Physical Therapy  Hip stiffness, unspecified laterality - Plan: Ambulatory referral to Physical Therapy  Referral to PT to work on her chronic pain and stiffness Hydrocodone for the short term for pain control Discussed humira and encouraged her that she will be able to learn to self- admin the pen at home.  She is nervous about this  Signed Lamar Blinks, MD

## 2014-11-23 ENCOUNTER — Encounter: Payer: Self-pay | Admitting: Family Medicine

## 2014-12-05 ENCOUNTER — Other Ambulatory Visit: Payer: Self-pay | Admitting: Family Medicine

## 2014-12-05 DIAGNOSIS — E559 Vitamin D deficiency, unspecified: Secondary | ICD-10-CM

## 2014-12-05 DIAGNOSIS — M25552 Pain in left hip: Secondary | ICD-10-CM

## 2014-12-05 MED ORDER — HYDROCODONE-ACETAMINOPHEN 5-325 MG PO TABS: 1.0000 | ORAL_TABLET | Freq: Four times a day (QID) | ORAL | Status: DC | PRN

## 2014-12-31 ENCOUNTER — Other Ambulatory Visit: Payer: Self-pay | Admitting: Family Medicine

## 2014-12-31 DIAGNOSIS — M25552 Pain in left hip: Secondary | ICD-10-CM

## 2015-01-02 MED ORDER — HYDROCODONE-ACETAMINOPHEN 5-325 MG PO TABS: 1.0000 | ORAL_TABLET | Freq: Four times a day (QID) | ORAL | Status: DC | PRN

## 2015-01-02 NOTE — Addendum Note (Signed)
Addended by: Lamar Blinks C on: 01/02/2015 12:40 PM   Modules accepted: Orders

## 2015-01-11 ENCOUNTER — Telehealth: Payer: Self-pay

## 2015-01-11 NOTE — Telephone Encounter (Signed)
PA approved for duloxetine, case # [id number]. Notified pharm.

## 2015-01-17 ENCOUNTER — Telehealth: Payer: Self-pay

## 2015-01-17 NOTE — Telephone Encounter (Signed)
Patient checking on status of request for DULoxetine (CYMBALTA) 60 MG capsule Records show the PA was approved on 01/11/15 and pharmacy was notified.   [phone] (H)

## 2015-01-18 NOTE — Telephone Encounter (Signed)
Pt advised.

## 2015-06-08 ENCOUNTER — Other Ambulatory Visit: Payer: Self-pay

## 2015-06-08 DIAGNOSIS — F32A Depression, unspecified: Secondary | ICD-10-CM

## 2015-06-08 DIAGNOSIS — F329 Major depressive disorder, single episode, unspecified: Secondary | ICD-10-CM

## 2015-06-08 MED ORDER — DULOXETINE HCL 60 MG PO CPEP: 60.0000 mg | ORAL_CAPSULE | Freq: Two times a day (BID) | ORAL | Status: DC

## 2015-06-12 ENCOUNTER — Other Ambulatory Visit: Payer: Self-pay | Admitting: Family Medicine

## 2015-07-21 ENCOUNTER — Encounter (HOSPITAL_COMMUNITY)
Admission: RE | Admit: 2015-07-21 | Discharge: 2015-07-21 | Disposition: A | Discharge status: Home / Self Care | Payer: 59 | Source: Ambulatory Visit | Attending: Orthopaedic Surgery | Admitting: Orthopaedic Surgery

## 2015-07-21 ENCOUNTER — Encounter (HOSPITAL_COMMUNITY): Payer: Self-pay

## 2015-07-21 ENCOUNTER — Ambulatory Visit (HOSPITAL_COMMUNITY)
Admission: RE | Admit: 2015-07-21 | Discharge: 2015-07-21 | Disposition: A | Discharge status: Home / Self Care | Payer: 59 | Source: Ambulatory Visit | Attending: Orthopedic Surgery | Admitting: Orthopedic Surgery

## 2015-07-21 DIAGNOSIS — Z01812 Encounter for preprocedural laboratory examination: Secondary | ICD-10-CM | POA: Insufficient documentation

## 2015-07-21 DIAGNOSIS — Z01818 Encounter for other preprocedural examination: Secondary | ICD-10-CM

## 2015-07-21 DIAGNOSIS — Z01810 Encounter for preprocedural cardiovascular examination: Secondary | ICD-10-CM | POA: Insufficient documentation

## 2015-07-21 HISTORY — DX: Unspecified osteoarthritis, unspecified site: M19.90

## 2015-07-21 HISTORY — DX: Effusion, unspecified joint: M25.40

## 2015-07-21 HISTORY — DX: Unspecified convulsions: R56.9

## 2015-07-21 HISTORY — DX: Depression, unspecified: F32.A

## 2015-07-21 HISTORY — DX: Pain in unspecified joint: M25.50

## 2015-07-21 HISTORY — DX: Major depressive disorder, single episode, unspecified: F32.9

## 2015-07-21 LAB — COMPREHENSIVE METABOLIC PANEL
ALT: 9 U/L — ABNORMAL LOW (ref 14–54)
AST: 20 U/L (ref 15–41)
Albumin: 3.5 g/dL (ref 3.5–5.0)
Alkaline Phosphatase: 91 U/L (ref 38–126)
Anion gap: 10 (ref 5–15)
BUN: 11 mg/dL (ref 6–20)
CHLORIDE: 103 mmol/L (ref 101–111)
CO2: 23 mmol/L (ref 22–32)
Calcium: 8.8 mg/dL — ABNORMAL LOW (ref 8.9–10.3)
Creatinine, Ser: 0.62 mg/dL (ref 0.44–1.00)
GFR calc Af Amer: >60 mL/min (ref >60)
Glucose, Bld: 75 mg/dL (ref 65–99)
POTASSIUM: 3.7 mmol/L (ref 3.5–5.1)
SODIUM: 136 mmol/L (ref 135–145)
Total Bilirubin: 0.7 mg/dL (ref 0.3–1.2)
Total Protein: 7.1 g/dL (ref 6.5–8.1)

## 2015-07-21 LAB — CBC WITH DIFFERENTIAL/PLATELET
BASOS ABS: 0 10*3/uL (ref 0.0–0.1)
BASOS PCT: 1 %
EOS ABS: 1.1 10*3/uL — AB (ref 0.0–0.7)
EOS PCT: 15 %
HCT: 40.3 % (ref 36.0–46.0)
Hemoglobin: 13.4 g/dL (ref 12.0–15.0)
Lymphocytes Relative: 27 %
Lymphs Abs: 1.9 10*3/uL (ref 0.7–4.0)
MCH: 31.2 pg (ref 26.0–34.0)
MCHC: 33.3 g/dL (ref 30.0–36.0)
MCV: 93.7 fL (ref 78.0–100.0)
MONO ABS: 0.6 10*3/uL (ref 0.1–1.0)
Monocytes Relative: 9 %
Neutro Abs: 3.4 10*3/uL (ref 1.7–7.7)
Neutrophils Relative %: 48 %
PLATELETS: 355 10*3/uL (ref 150–400)
RBC: 4.3 MIL/uL (ref 3.87–5.11)
RDW: 13.3 % (ref 11.5–15.5)
WBC: 7 10*3/uL (ref 4.0–10.5)

## 2015-07-21 LAB — URINALYSIS, ROUTINE W REFLEX MICROSCOPIC
Bilirubin Urine: NEGATIVE
GLUCOSE, UA: NEGATIVE mg/dL
HGB URINE DIPSTICK: NEGATIVE
Ketones, ur: NEGATIVE mg/dL
Leukocytes, UA: NEGATIVE
NITRITE: NEGATIVE
PH: 6 (ref 5.0–8.0)
Protein, ur: NEGATIVE mg/dL
Specific Gravity, Urine: 1.008 (ref 1.005–1.030)

## 2015-07-21 LAB — TYPE AND SCREEN
ABO/RH(D): O POS
ANTIBODY SCREEN: NEGATIVE

## 2015-07-21 LAB — SURGICAL PCR SCREEN
MRSA, PCR: NEGATIVE
STAPHYLOCOCCUS AUREUS: NEGATIVE

## 2015-07-21 LAB — PROTIME-INR
INR: 1.11 (ref 0.00–1.49)
PROTHROMBIN TIME: 14.5 s (ref 11.6–15.2)

## 2015-07-21 LAB — ABO/RH: ABO/RH(D): O POS

## 2015-07-21 LAB — HCG, SERUM, QUALITATIVE: PREG SERUM: NEGATIVE

## 2015-07-21 LAB — APTT: APTT: 30 s (ref 24–37)

## 2015-07-21 MED ORDER — CHLORHEXIDINE GLUCONATE 4 % EX LIQD: 60.0000 mL | Freq: Once | CUTANEOUS | Status: DC

## 2015-07-21 NOTE — Pre-Procedure Instructions (Signed)
Rose Campbell  07/21/2015      [facility], [place] [address] Phone: [phone] Fax: [place] [place] [address] [place] [address] [place] [address] Phone: [phone] Fax: [phone]    Your procedure is scheduled on Thurs, July 13 @ 7:15 AM  Report to [facility] Admitting at 5:30 AM  Call this number if you have problems the morning of surgery:  [phone]   Remember:  Do not eat food or drink liquids after midnight.  Take these medicines the morning of surgery with A SIP OF WATER Duloxetine(Cymbalta) and Pain Pill(if needed)             Stop taking your Mobic and Methotrexate a week prior to surgery. No Goody's,BC's,Aleve,Advil,Motrin,Ibuprofen,Fish Oil,or any Herbal Medications.    Do not wear jewelry, make-up or nail polish.  Do not wear lotions, powders, or perfumes.    Do not shave 48 hours prior to surgery.    Do not bring valuables to the hospital.  [facility] is not responsible for any belongings or valuables.  Contacts, dentures or bridgework may not be worn into surgery.  Leave your suitcase in the car.  After surgery it may be brought to your room.  For patients admitted to the hospital, discharge time will be determined by your treatment team.  Patients discharged the day of surgery will not be allowed to drive home.    Special instructions:   - Preparing for Surgery  Before surgery, you can play an important role.  Because skin is not sterile, your skin needs to be as free of germs as possible.  You can reduce the number of germs on you skin by washing with CHG (chlorahexidine gluconate) soap before surgery.  CHG is an antiseptic cleaner which kills germs and bonds with the skin to continue killing germs even after washing.  Please DO NOT use if you have an allergy to CHG  or antibacterial soaps.  If your skin becomes reddened/irritated stop using the CHG and inform your nurse when you arrive at Short Stay.  Do not shave (including legs and underarms) for at least 48 hours prior to the first CHG shower.  You may shave your face.  Please follow these instructions carefully:   1.  Shower with CHG Soap the night before surgery and the                                morning of Surgery.  2.  If you choose to wash your hair, wash your hair first as usual with your       normal shampoo.  3.  After you shampoo, rinse your hair and body thoroughly to remove the                      Shampoo.  4.  Use CHG as you would any other liquid soap.  You can apply chg directly       to the skin and wash gently with scrungie or a clean washcloth.  5.  Apply the CHG Soap to your body ONLY FROM THE NECK DOWN.        Do not use on open wounds or open sores.  Avoid contact with your eyes,  ears, mouth and genitals (private parts).  Wash genitals (private parts)       with your normal soap.  6.  Wash thoroughly, paying special attention to the area where your surgery        will be performed.  7.  Thoroughly rinse your body with warm water from the neck down.  8.  DO NOT shower/wash with your normal soap after using and rinsing off       the CHG Soap.  9.  Pat yourself dry with a clean towel.            10.  Wear clean pajamas.            11.  Place clean sheets on your bed the night of your first shower and do not        sleep with pets.  Day of Surgery  Do not apply any lotions/deoderants the morning of surgery.  Please wear clean clothes to the hospital/surgery center.    Please read over the following fact sheets that you were given. Pain Booklet, Coughing and Deep Breathing, MRSA Information and Surgical Site Infection Prevention

## 2015-07-21 NOTE — Progress Notes (Addendum)
Cardiologist denies. She did see one in 2013 when being diagnosed with Lupus. Report under care everywhere  Medical MD is with [facility] but her doctor retired  Corporate investment banker is [place] test denies  Echo report in epic from 2013  Heart cath denies  EKG denies in past yr  CXR denies in past yr

## 2015-07-22 LAB — URINE CULTURE: Special Requests: NORMAL

## 2015-08-01 DIAGNOSIS — Z91040 Latex allergy status: Secondary | ICD-10-CM | POA: Diagnosis present

## 2015-08-01 NOTE — H&P (Signed)
  Rose Campbell Rose Campbell, [place] [phone]  08/01/2015 5:18 PM

## 2015-08-02 MED ORDER — SODIUM CHLORIDE 0.9 % IV SOLN: INTRAVENOUS | Status: DC

## 2015-08-02 MED ORDER — CEFAZOLIN SODIUM-DEXTROSE 2-4 GM/100ML-% IV SOLN
2.0000 g | INTRAVENOUS | Status: AC
  Administered 2015-08-03: 2 g via INTRAVENOUS
  Filled 2015-08-02: qty 100

## 2015-08-02 MED ORDER — ACETAMINOPHEN 10 MG/ML IV SOLN
1000.0000 mg | Freq: Once | INTRAVENOUS | Status: AC
  Administered 2015-08-03: 1000 mg via INTRAVENOUS

## 2015-08-02 MED ORDER — TRANEXAMIC ACID 1000 MG/10ML IV SOLN
2000.0000 mg | INTRAVENOUS | Status: AC
  Administered 2015-08-03: 2000 mg via TOPICAL
  Filled 2015-08-02: qty 20

## 2015-08-02 NOTE — Anesthesia Preprocedure Evaluation (Addendum)
Anesthesia Evaluation  Patient identified by MRN, date of birth, ID band Patient awake    Reviewed: Allergy & Precautions, NPO status , Patient's Chart, lab work & pertinent test results  Airway Mallampati: II  TM Distance: >3 FB Neck ROM: Full    Dental no notable dental hx. (+) Teeth Intact, Dental Advisory Given   Pulmonary neg pulmonary ROS,    Pulmonary exam normal breath sounds clear to auscultation       Cardiovascular negative cardio ROS Normal cardiovascular exam Rhythm:Regular Rate:Normal     Neuro/Psych Seizures -,  PSYCHIATRIC DISORDERS Depression negative psych ROS   GI/Hepatic negative GI ROS, Neg liver ROS,   Endo/Other  negative endocrine ROS  Renal/GU negative Renal ROS     Musculoskeletal  (+) Arthritis ,   Abdominal   Peds  Hematology negative hematology ROS (+)   Anesthesia Other Findings   Reproductive/Obstetrics negative OB ROS                            Anesthesia Physical Anesthesia Plan  ASA: II  Anesthesia Plan:    Post-op Pain Management:    Induction:   Airway Management Planned:   Additional Equipment:   Intra-op Plan:   Post-operative Plan:   Informed Consent: I have reviewed the patients History and Physical, chart, labs and discussed the procedure including the risks, benefits and alternatives for the proposed anesthesia with the patient or authorized representative who has indicated his/her understanding and acceptance.   Dental advisory given  Plan Discussed with: CRNA  Anesthesia Plan Comments:         Anesthesia Quick Evaluation

## 2015-08-03 ENCOUNTER — Inpatient Hospital Stay (HOSPITAL_COMMUNITY): Payer: 59 | Admitting: Anesthesiology

## 2015-08-03 ENCOUNTER — Encounter (HOSPITAL_COMMUNITY): Payer: Self-pay | Admitting: *Deleted

## 2015-08-03 ENCOUNTER — Inpatient Hospital Stay (HOSPITAL_COMMUNITY)
Admission: RE | Admit: 2015-08-03 | Discharge: 2015-08-05 | DRG: 470 | Disposition: A | Discharge status: Home Health | Payer: 59 | Source: Ambulatory Visit | Attending: Orthopaedic Surgery | Admitting: Orthopaedic Surgery

## 2015-08-03 ENCOUNTER — Encounter (HOSPITAL_COMMUNITY)
Admission: RE | Disposition: A | Discharge status: Home Health | Payer: Self-pay | Source: Ambulatory Visit | Attending: Orthopaedic Surgery

## 2015-08-03 ENCOUNTER — Inpatient Hospital Stay (HOSPITAL_COMMUNITY): Payer: 59

## 2015-08-03 DIAGNOSIS — E876 Hypokalemia: Secondary | ICD-10-CM | POA: Diagnosis not present

## 2015-08-03 DIAGNOSIS — Z91040 Latex allergy status: Secondary | ICD-10-CM

## 2015-08-03 DIAGNOSIS — D62 Acute posthemorrhagic anemia: Secondary | ICD-10-CM | POA: Diagnosis not present

## 2015-08-03 DIAGNOSIS — Z9889 Other specified postprocedural states: Secondary | ICD-10-CM

## 2015-08-03 DIAGNOSIS — M90552 Osteonecrosis in diseases classified elsewhere, left thigh: Secondary | ICD-10-CM | POA: Diagnosis present

## 2015-08-03 DIAGNOSIS — M1612 Unilateral primary osteoarthritis, left hip: Secondary | ICD-10-CM | POA: Diagnosis present

## 2015-08-03 DIAGNOSIS — M329 Systemic lupus erythematosus, unspecified: Secondary | ICD-10-CM | POA: Diagnosis present

## 2015-08-03 DIAGNOSIS — F329 Major depressive disorder, single episode, unspecified: Secondary | ICD-10-CM | POA: Diagnosis present

## 2015-08-03 DIAGNOSIS — Z96649 Presence of unspecified artificial hip joint: Secondary | ICD-10-CM

## 2015-08-03 HISTORY — PX: TOTAL HIP ARTHROPLASTY: SHX124

## 2015-08-03 SURGERY — ARTHROPLASTY, HIP, TOTAL,POSTERIOR APPROACH
Anesthesia: General | Site: Hip | Laterality: Left

## 2015-08-03 MED ORDER — HYDROMORPHONE HCL 1 MG/ML IJ SOLN
0.2500 mg | INTRAMUSCULAR | Status: DC | PRN
  Administered 2015-08-03 (×4): 0.5 mg via INTRAVENOUS

## 2015-08-03 MED ORDER — EPHEDRINE 5 MG/ML INJ
INTRAVENOUS | Status: AC
  Filled 2015-08-03: qty 10

## 2015-08-03 MED ORDER — FOLIC ACID 1 MG PO TABS
1.0000 mg | ORAL_TABLET | Freq: Every day | ORAL | Status: DC
  Administered 2015-08-03 – 2015-08-05 (×3): 1 mg via ORAL
  Filled 2015-08-03 (×3): qty 1

## 2015-08-03 MED ORDER — LACTATED RINGERS IV SOLN
INTRAVENOUS | Status: DC | PRN
Start: 2015-08-03 — End: 2015-08-03
  Administered 2015-08-03 (×2): via INTRAVENOUS

## 2015-08-03 MED ORDER — ACETAMINOPHEN 10 MG/ML IV SOLN
INTRAVENOUS | Status: AC
  Filled 2015-08-03: qty 100

## 2015-08-03 MED ORDER — ONDANSETRON HCL 4 MG/2ML IJ SOLN
INTRAMUSCULAR | Status: AC
  Filled 2015-08-03: qty 2

## 2015-08-03 MED ORDER — MIDAZOLAM HCL 2 MG/2ML IJ SOLN
INTRAMUSCULAR | Status: DC | PRN
  Administered 2015-08-03: 2 mg via INTRAVENOUS

## 2015-08-03 MED ORDER — LIDOCAINE 2% (20 MG/ML) 5 ML SYRINGE
INTRAMUSCULAR | Status: AC
  Filled 2015-08-03: qty 5

## 2015-08-03 MED ORDER — ONDANSETRON HCL 4 MG/2ML IJ SOLN
INTRAMUSCULAR | Status: DC | PRN
  Administered 2015-08-03: 4 mg via INTRAVENOUS

## 2015-08-03 MED ORDER — HYDROMORPHONE HCL 1 MG/ML IJ SOLN
INTRAMUSCULAR | Status: AC
  Filled 2015-08-03: qty 1

## 2015-08-03 MED ORDER — PROMETHAZINE HCL 25 MG/ML IJ SOLN: 6.2500 mg | INTRAMUSCULAR | Status: DC | PRN

## 2015-08-03 MED ORDER — DEXAMETHASONE SODIUM PHOSPHATE 10 MG/ML IJ SOLN
INTRAMUSCULAR | Status: AC
  Filled 2015-08-03: qty 1

## 2015-08-03 MED ORDER — OXYCODONE HCL 5 MG PO TABS
ORAL_TABLET | ORAL | Status: AC
  Filled 2015-08-03: qty 2

## 2015-08-03 MED ORDER — ONDANSETRON HCL 4 MG/2ML IJ SOLN: 4.0000 mg | Freq: Four times a day (QID) | INTRAMUSCULAR | Status: DC | PRN

## 2015-08-03 MED ORDER — RIVAROXABAN 10 MG PO TABS
10.0000 mg | ORAL_TABLET | Freq: Every day | ORAL | Status: DC
  Administered 2015-08-04 – 2015-08-05 (×2): 10 mg via ORAL
  Filled 2015-08-03 (×2): qty 1

## 2015-08-03 MED ORDER — ONDANSETRON HCL 4 MG PO TABS: 4.0000 mg | ORAL_TABLET | Freq: Four times a day (QID) | ORAL | Status: DC | PRN

## 2015-08-03 MED ORDER — FENTANYL CITRATE (PF) 250 MCG/5ML IJ SOLN
INTRAMUSCULAR | Status: DC | PRN
  Administered 2015-08-03 (×2): 100 ug via INTRAVENOUS
  Administered 2015-08-03: 50 ug via INTRAVENOUS
  Administered 2015-08-03: 150 ug via INTRAVENOUS
  Administered 2015-08-03 (×2): 50 ug via INTRAVENOUS

## 2015-08-03 MED ORDER — METOCLOPRAMIDE HCL 5 MG PO TABS
5.0000 mg | ORAL_TABLET | Freq: Three times a day (TID) | ORAL | Status: DC | PRN
Start: 2015-08-03 — End: 2015-08-05

## 2015-08-03 MED ORDER — MAGNESIUM CITRATE PO SOLN: 1.0000 | Freq: Once | ORAL | Status: DC | PRN

## 2015-08-03 MED ORDER — DEXTROSE 5 % IV SOLN
500.0000 mg | Freq: Four times a day (QID) | INTRAVENOUS | Status: DC | PRN
  Filled 2015-08-03: qty 5

## 2015-08-03 MED ORDER — SODIUM CHLORIDE 0.9 % IV SOLN
75.0000 mL/h | INTRAVENOUS | Status: DC
  Administered 2015-08-03: 75 mL/h via INTRAVENOUS

## 2015-08-03 MED ORDER — METHOCARBAMOL 500 MG PO TABS
500.0000 mg | ORAL_TABLET | Freq: Four times a day (QID) | ORAL | Status: DC | PRN
  Administered 2015-08-03 – 2015-08-05 (×6): 500 mg via ORAL
  Filled 2015-08-03 (×6): qty 1

## 2015-08-03 MED ORDER — GLYCOPYRROLATE 0.2 MG/ML IJ SOLN
INTRAMUSCULAR | Status: DC | PRN
  Administered 2015-08-03: 0.4 mg via INTRAVENOUS
  Administered 2015-08-03: 0.2 mg via INTRAVENOUS

## 2015-08-03 MED ORDER — PROPOFOL 10 MG/ML IV BOLUS
INTRAVENOUS | Status: DC | PRN
  Administered 2015-08-03: 200 mg via INTRAVENOUS

## 2015-08-03 MED ORDER — HYDROMORPHONE HCL 1 MG/ML IJ SOLN
0.5000 mg | INTRAMUSCULAR | Status: DC | PRN
  Administered 2015-08-04 – 2015-08-05 (×4): 1 mg via INTRAVENOUS
  Filled 2015-08-03 (×4): qty 1

## 2015-08-03 MED ORDER — MEPERIDINE HCL 25 MG/ML IJ SOLN: 6.2500 mg | INTRAMUSCULAR | Status: DC | PRN

## 2015-08-03 MED ORDER — PROPOFOL 10 MG/ML IV BOLUS
INTRAVENOUS | Status: AC
  Filled 2015-08-03: qty 20

## 2015-08-03 MED ORDER — METOCLOPRAMIDE HCL 5 MG/ML IJ SOLN: 5.0000 mg | Freq: Three times a day (TID) | INTRAMUSCULAR | Status: DC | PRN

## 2015-08-03 MED ORDER — GLYCOPYRROLATE 0.2 MG/ML IV SOSY
PREFILLED_SYRINGE | INTRAVENOUS | Status: AC
  Filled 2015-08-03: qty 3

## 2015-08-03 MED ORDER — PHENOL 1.4 % MT LIQD
1.0000 | OROMUCOSAL | Status: DC | PRN
Start: 2015-08-03 — End: 2015-08-05

## 2015-08-03 MED ORDER — MIDAZOLAM HCL 2 MG/2ML IJ SOLN
INTRAMUSCULAR | Status: AC
  Filled 2015-08-03: qty 2

## 2015-08-03 MED ORDER — DEXAMETHASONE SODIUM PHOSPHATE 10 MG/ML IJ SOLN
INTRAMUSCULAR | Status: DC | PRN
  Administered 2015-08-03: 10 mg via INTRAVENOUS

## 2015-08-03 MED ORDER — LIDOCAINE HCL (CARDIAC) 20 MG/ML IV SOLN
INTRAVENOUS | Status: DC | PRN
  Administered 2015-08-03: 100 mg via INTRAVENOUS

## 2015-08-03 MED ORDER — SUGAMMADEX SODIUM 200 MG/2ML IV SOLN
INTRAVENOUS | Status: AC
  Filled 2015-08-03: qty 2

## 2015-08-03 MED ORDER — BUPIVACAINE-EPINEPHRINE (PF) 0.25% -1:200000 IJ SOLN
INTRAMUSCULAR | Status: DC | PRN
  Administered 2015-08-03: 30 mL via PERINEURAL

## 2015-08-03 MED ORDER — 0.9 % SODIUM CHLORIDE (POUR BTL) OPTIME
TOPICAL | Status: DC | PRN
  Administered 2015-08-03: 1000 mL

## 2015-08-03 MED ORDER — FENTANYL CITRATE (PF) 250 MCG/5ML IJ SOLN
INTRAMUSCULAR | Status: AC
  Filled 2015-08-03: qty 5

## 2015-08-03 MED ORDER — POLYETHYLENE GLYCOL 3350 17 GM/SCOOP PO POWD: 17.0000 g | Freq: Two times a day (BID) | ORAL | Status: DC | PRN

## 2015-08-03 MED ORDER — CEFAZOLIN SODIUM-DEXTROSE 2-4 GM/100ML-% IV SOLN
2.0000 g | Freq: Four times a day (QID) | INTRAVENOUS | Status: AC
  Administered 2015-08-03 (×2): 2 g via INTRAVENOUS
  Filled 2015-08-03 (×2): qty 100

## 2015-08-03 MED ORDER — VITAMIN D (ERGOCALCIFEROL) 1.25 MG (50000 UNIT) PO CAPS: 50000.0000 [IU] | ORAL_CAPSULE | ORAL | Status: DC

## 2015-08-03 MED ORDER — OXYCODONE HCL 5 MG PO TABS
5.0000 mg | ORAL_TABLET | ORAL | Status: DC | PRN
  Administered 2015-08-03 – 2015-08-05 (×11): 10 mg via ORAL
  Filled 2015-08-03 (×10): qty 2

## 2015-08-03 MED ORDER — BUPIVACAINE-EPINEPHRINE (PF) 0.25% -1:200000 IJ SOLN
INTRAMUSCULAR | Status: AC
  Filled 2015-08-03: qty 30

## 2015-08-03 MED ORDER — KETOROLAC TROMETHAMINE 15 MG/ML IJ SOLN
7.5000 mg | Freq: Four times a day (QID) | INTRAMUSCULAR | Status: AC
  Administered 2015-08-03 – 2015-08-04 (×4): 7.5 mg via INTRAVENOUS
  Filled 2015-08-03 (×2): qty 1

## 2015-08-03 MED ORDER — MENTHOL 3 MG MT LOZG
1.0000 | LOZENGE | OROMUCOSAL | Status: DC | PRN
  Administered 2015-08-05: 3 mg via ORAL
  Filled 2015-08-03 (×3): qty 9

## 2015-08-03 MED ORDER — DOCUSATE SODIUM 100 MG PO CAPS
100.0000 mg | ORAL_CAPSULE | Freq: Two times a day (BID) | ORAL | Status: DC
  Administered 2015-08-03 – 2015-08-05 (×5): 100 mg via ORAL
  Filled 2015-08-03 (×4): qty 1

## 2015-08-03 MED ORDER — ROCURONIUM BROMIDE 50 MG/5ML IV SOLN
INTRAVENOUS | Status: AC
  Filled 2015-08-03: qty 1

## 2015-08-03 MED ORDER — HYDROMORPHONE HCL 1 MG/ML IJ SOLN: 0.2500 mg | INTRAMUSCULAR | Status: DC | PRN

## 2015-08-03 MED ORDER — POLYETHYLENE GLYCOL 3350 17 G PO PACK: 17.0000 g | PACK | Freq: Two times a day (BID) | ORAL | Status: DC | PRN

## 2015-08-03 MED ORDER — BISACODYL 10 MG RE SUPP: 10.0000 mg | Freq: Every day | RECTAL | Status: DC | PRN

## 2015-08-03 MED ORDER — ACETAMINOPHEN 10 MG/ML IV SOLN
1000.0000 mg | Freq: Four times a day (QID) | INTRAVENOUS | Status: AC
  Administered 2015-08-03 – 2015-08-04 (×4): 1000 mg via INTRAVENOUS
  Filled 2015-08-03 (×4): qty 100

## 2015-08-03 MED ORDER — ROCURONIUM BROMIDE 100 MG/10ML IV SOLN
INTRAVENOUS | Status: DC | PRN
  Administered 2015-08-03: 40 mg via INTRAVENOUS

## 2015-08-03 MED ORDER — ALUM & MAG HYDROXIDE-SIMETH 200-200-20 MG/5ML PO SUSP: 30.0000 mL | ORAL | Status: DC | PRN

## 2015-08-03 MED ORDER — DIPHENHYDRAMINE HCL 12.5 MG/5ML PO ELIX: 12.5000 mg | ORAL_SOLUTION | ORAL | Status: DC | PRN

## 2015-08-03 MED ORDER — KETOROLAC TROMETHAMINE 15 MG/ML IJ SOLN
INTRAMUSCULAR | Status: AC
  Administered 2015-08-03: 15 mg
  Filled 2015-08-03: qty 1

## 2015-08-03 MED ORDER — POLYETHYLENE GLYCOL 3350 17 G PO PACK: 17.0000 g | PACK | Freq: Every day | ORAL | Status: DC | PRN

## 2015-08-03 MED ORDER — SUGAMMADEX SODIUM 200 MG/2ML IV SOLN
INTRAVENOUS | Status: DC | PRN
  Administered 2015-08-03: 120 mg via INTRAVENOUS

## 2015-08-03 MED ORDER — DULOXETINE HCL 60 MG PO CPEP
60.0000 mg | ORAL_CAPSULE | Freq: Two times a day (BID) | ORAL | Status: DC
  Administered 2015-08-03 – 2015-08-05 (×4): 60 mg via ORAL
  Filled 2015-08-03 (×4): qty 1

## 2015-08-03 SURGICAL SUPPLY — 55 items
BLADE SAW SAG 73X25 THK (BLADE) ×1
BLADE SAW SGTL 73X25 THK (BLADE) ×1 IMPLANT
BRUSH FEMORAL CANAL (MISCELLANEOUS) IMPLANT
CAPT HIP TOTAL 2 ×2 IMPLANT
COVER SURGICAL LIGHT HANDLE (MISCELLANEOUS) ×2 IMPLANT
DRAPE INCISE IOBAN 66X45 STRL (DRAPES) ×2 IMPLANT
DRAPE ORTHO SPLIT 77X108 STRL (DRAPES) ×2
DRAPE SURG ORHT 6 SPLT 77X108 (DRAPES) ×2 IMPLANT
DRSG MEPILEX BORDER 4X12 (GAUZE/BANDAGES/DRESSINGS) ×2 IMPLANT
DURAPREP 26ML APPLICATOR (WOUND CARE) ×4 IMPLANT
ELECT BLADE 6.5 EXT (BLADE) ×2 IMPLANT
ELECT REM PT RETURN 9FT ADLT (ELECTROSURGICAL) ×2
ELECTRODE REM PT RTRN 9FT ADLT (ELECTROSURGICAL) ×1 IMPLANT
EVACUATOR 1/8 PVC DRAIN (DRAIN) IMPLANT
FACESHIELD WRAPAROUND (MASK) ×4 IMPLANT
GLOVE BIOGEL PI IND STRL 8 (GLOVE) ×2 IMPLANT
GLOVE BIOGEL PI IND STRL 8.5 (GLOVE) ×1 IMPLANT
GLOVE BIOGEL PI INDICATOR 8 (GLOVE) ×2
GLOVE BIOGEL PI INDICATOR 8.5 (GLOVE) ×1
GLOVE ECLIPSE 8.0 STRL XLNG CF (GLOVE) ×4 IMPLANT
GLOVE SURG ORTHO 8.5 STRL (GLOVE) ×4 IMPLANT
GOWN STRL REUS W/ TWL LRG LVL3 (GOWN DISPOSABLE) ×2 IMPLANT
GOWN STRL REUS W/TWL 2XL LVL3 (GOWN DISPOSABLE) ×4 IMPLANT
GOWN STRL REUS W/TWL LRG LVL3 (GOWN DISPOSABLE) ×2
HANDPIECE INTERPULSE COAX TIP (DISPOSABLE)
IMMOBILIZER KNEE 20 (SOFTGOODS) IMPLANT
IMMOBILIZER KNEE 22 UNIV (SOFTGOODS) ×2 IMPLANT
KIT BASIN OR (CUSTOM PROCEDURE TRAY) ×2 IMPLANT
KIT ROOM TURNOVER OR (KITS) ×2 IMPLANT
MANIFOLD NEPTUNE II (INSTRUMENTS) ×2 IMPLANT
NEEDLE 22X1 1/2 (OR ONLY) (NEEDLE) ×2 IMPLANT
NS IRRIG 1000ML POUR BTL (IV SOLUTION) ×2 IMPLANT
PACK TOTAL JOINT (CUSTOM PROCEDURE TRAY) ×2 IMPLANT
PACK UNIVERSAL I (CUSTOM PROCEDURE TRAY) IMPLANT
PAD ARMBOARD 7.5X6 YLW CONV (MISCELLANEOUS) ×2 IMPLANT
PRESSURIZER FEMORAL UNIV (MISCELLANEOUS) IMPLANT
SET HNDPC FAN SPRY TIP SCT (DISPOSABLE) IMPLANT
STAPLER VISISTAT 35W (STAPLE) ×2 IMPLANT
SUCTION FRAZIER HANDLE 10FR (MISCELLANEOUS) ×1
SUCTION TUBE FRAZIER 10FR DISP (MISCELLANEOUS) ×1 IMPLANT
SUT BONE WAX W31G (SUTURE) ×2 IMPLANT
SUT ETHIBOND NAB CT1 #1 30IN (SUTURE) ×6 IMPLANT
SUT MNCRL AB 3-0 PS2 18 (SUTURE) ×2 IMPLANT
SUT VIC AB 0 CT1 27 (SUTURE) ×2
SUT VIC AB 0 CT1 27XBRD ANBCTR (SUTURE) ×2 IMPLANT
SUT VIC AB 1 CT1 27 (SUTURE) ×2
SUT VIC AB 1 CT1 27XBRD ANBCTR (SUTURE) ×2 IMPLANT
SUT VIC AB 2-0 CT1 27 (SUTURE) ×1
SUT VIC AB 2-0 CT1 TAPERPNT 27 (SUTURE) ×1 IMPLANT
SYR CONTROL 10ML LL (SYRINGE) ×2 IMPLANT
TOWEL OR 17X24 6PK STRL BLUE (TOWEL DISPOSABLE) ×2 IMPLANT
TOWEL OR 17X26 10 PK STRL BLUE (TOWEL DISPOSABLE) ×2 IMPLANT
TOWER CARTRIDGE SMART MIX (DISPOSABLE) IMPLANT
WATER STERILE IRR 1000ML POUR (IV SOLUTION) IMPLANT
WRAP KNEE MAXI GEL POST OP (GAUZE/BANDAGES/DRESSINGS) ×2 IMPLANT

## 2015-08-03 NOTE — Anesthesia Postprocedure Evaluation (Signed)
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(s) Performed: Procedure(s) (LRB): LEFT TOTAL HIP ARTHROPLASTY (Left)  Patient location during evaluation: PACU Anesthesia Type: General Level of consciousness: sedated and patient cooperative Pain management: pain level controlled Vital Signs Assessment: post-procedure vital signs reviewed and stable Respiratory status: spontaneous breathing Cardiovascular status: stable Anesthetic complications: no    Last Vitals:  Filed Vitals:   08/03/15 1128 08/03/15 1130  BP: 137/95   Pulse: 91 115  Temp:    Resp: 12 23    Last Pain:  Filed Vitals:   08/03/15 1147  PainSc: Asleep                 Nolon Nations

## 2015-08-03 NOTE — Anesthesia Procedure Notes (Signed)
Procedure Name: Intubation Date/Time: 08/03/2015 7:28 AM Performed by: Sampson Si E Pre-anesthesia Checklist: Patient identified, Emergency Drugs available, Suction available, Patient being monitored and Timeout performed Patient Re-evaluated:Patient Re-evaluated prior to inductionOxygen Delivery Method: Circle system utilized Preoxygenation: Pre-oxygenation with 100% oxygen Intubation Type: IV induction Ventilation: Mask ventilation without difficulty Laryngoscope Size: Mac and 3 Grade View: Grade II Tube type: Oral Tube size: 7.0 mm Number of attempts: 1 Airway Equipment and Method: Stylet Placement Confirmation: ETT inserted through vocal cords under direct vision,  positive ETCO2 and breath sounds checked- equal and bilateral Secured at: 21 cm Tube secured with: Tape Dental Injury: Teeth and Oropharynx as per pre-operative assessment

## 2015-08-03 NOTE — Progress Notes (Signed)
Orthopedic Tech Progress Note Patient Details:  Rose Campbell [birth year] [id number]  Patient ID: [name], female   DOB: Mar 05, 1985, 30 y.o.   MRN: [id number] Viewed order from doctor's order list  Hildred Priest 08/03/2015, 3:12 PM

## 2015-08-03 NOTE — Evaluation (Signed)
Physical Therapy Evaluation Patient Details Name: Rose Campbell MRN: [id number] DOB: 1985/05/28 Today's Date: 08/03/2015   History of Present Illness  Pt is a 30 y/o female s/p L THR. PMH including but not limited to lupus.  Clinical Impression  Pt presented supine in bed with HOB elevated, L knee immobilizer donned, and mother and sister present in room. Pt reported an 8/10 pain in her L hip at beginning of session. Pt was limited by pain and lethargy during session, and therefore did not ambulate or participated in any additional therapeutic exercises. Pt will need to ambulate and stair train in f/u sessions. Pt would continue to benefit from skilled physical therapy services at this time while inpatient and after d/c.      Follow Up Recommendations Home health PT    Equipment Recommendations  Rolling walker with 5" wheels;3in1 (PT);Wheelchair (measurements PT);Wheelchair cushion (measurements PT)    Recommendations for Other Services       Precautions / Restrictions Precautions Precautions: Posterior Hip Precaution Comments: PT gave pt posterior hip precautions paper and reviewed during session. Restrictions Weight Bearing Restrictions: Yes RLE Weight Bearing: Weight bearing as tolerated Other Position/Activity Restrictions: WBAT L LE      Mobility  Bed Mobility Overal bed mobility: Needs Assistance Bed Mobility: Supine to Sit     Supine to sit: Min assist;HOB elevated (with L LE)     General bed mobility comments: Pt required increased time  Transfers Overall transfer level: Needs assistance Equipment used: Rolling walker (2 wheeled) Transfers: Sit to/from [facility] Sit to Stand: Min guard Stand pivot transfers: Min guard       General transfer comment: Pt required increased time and verbal cueing to position bilateral hands to assist with transfer.  Ambulation/Gait                Stairs            Wheelchair Mobility     Modified Rankin (Stroke Patients Only)       Balance Overall balance assessment: Needs assistance Sitting-balance support: Bilateral upper extremity supported Sitting balance-Leahy Scale: Poor     Standing balance support: Bilateral upper extremity supported Standing balance-Leahy Scale: Poor                               Pertinent Vitals/Pain Pain Assessment: 0-10 Pain Score: 8  Pain Location: L hip Pain Descriptors / Indicators: Discomfort Pain Intervention(s): Limited activity within patient's tolerance;Monitored during session;Repositioned    Home Living Family/patient expects to be discharged to:: Private residence Living Arrangements: Other (Comment) (sister) Available Help at Discharge: Family   Home Access: Level entry     Home Layout: Two level;Bed/bath upstairs Home Equipment: None      Prior Function Level of Independence: Independent               Hand Dominance        Extremity/Trunk Assessment   Upper Extremity Assessment: Defer to OT evaluation           Lower Extremity Assessment: LLE deficits/detail   LLE Deficits / Details: Pt with decreased strength and ROM limitations secondary to post-op. Pt's sensation is grossly intact.     Communication   Communication: No difficulties  Cognition Arousal/Alertness: Lethargic Behavior During Therapy: WFL for tasks assessed/performed Overall Cognitive Status: Within Functional Limits for tasks assessed  General Comments      Exercises Total Joint Exercises Ankle Circles/Pumps: AROM;Strengthening;Both;10 reps      Assessment/Plan    PT Assessment Patient needs continued PT services  PT Diagnosis Difficulty walking   PT Problem List Decreased strength;Decreased range of motion;Decreased activity tolerance;Decreased balance;Decreased mobility;Decreased knowledge of use of DME;Pain  PT Treatment Interventions DME instruction;Gait  training;Stair training;Functional mobility training;Therapeutic activities;Therapeutic exercise;Balance training;Patient/family education   PT Goals (Current goals can be found in the Care Plan section) Acute Rehab PT Goals Patient Stated Goal: Pt would like to return to PLOF without pain. PT Goal Formulation: With patient Time For Goal Achievement: 08/10/15 Potential to Achieve Goals: Good    Frequency 7X/week   Barriers to discharge        Co-evaluation               End of Session Equipment Utilized During Treatment: Gait belt Activity Tolerance: Patient limited by lethargy;Patient limited by pain Patient left: in chair;with call bell/phone within reach;with family/visitor present Nurse Communication: Mobility status         Time: [id number] PT Time Calculation (min) (ACUTE ONLY): 34 min   Charges:   PT Evaluation $PT Eval Moderate Complexity: 1 Procedure PT Treatments $Therapeutic Activity: 8-22 mins   PT G CodesClearnce Sorrel Aldrich Campbell 08/03/2015, 3:58 PM Sherie Don, [place], DPT [phone]

## 2015-08-03 NOTE — Transfer of Care (Signed)
Immediate Anesthesia Transfer of Care Note  Patient: Rose Campbell  Procedure(s) Performed: Procedure(s): LEFT TOTAL HIP ARTHROPLASTY (Left)  Patient Location: PACU  Anesthesia Type:General  Level of Consciousness: awake  Airway & Oxygen Therapy: Patient Spontanous Breathing and Patient connected to nasal cannula oxygen  Post-op Assessment: Report given to RN  Post vital signs: Reviewed and stable  Last Vitals:  Filed Vitals:   08/03/15 0559  BP: 154/96  Pulse: 65  Temp: 36.9 C    Last Pain: There were no vitals filed for this visit.       Complications: No apparent anesthesia complications

## 2015-08-03 NOTE — Op Note (Signed)
NAMELABRITTANY, Rose Campbell              ACCOUNT NO.:  [id number]  MEDICAL RECORD NO.:  [id number]  LOCATION:  [facility]                         FACILITY:  [facility]  PHYSICIAN:  Vonna Kotyk. Hebert Dooling, M.D.DATE OF BIRTH:  11-28-1985  DATE OF PROCEDURE:  08/03/2015 DATE OF DISCHARGE:                              OPERATIVE REPORT   PREOPERATIVE DIAGNOSIS:  End-stage osteoarthritis, left hip.  POSTOPERATIVE DIAGNOSIS:  End-stage osteoarthritis, left hip.  PROCEDURE:  Left total hip replacement.  SURGEON:  Vonna Kotyk. Durward Fortes, M.D.  ASSISTANT:  Aaron Edelman D. Petrarca, PA-C.  ANESTHESIA:  General.  COMPLICATIONS:  None.  COMPONENTS:  DePuy AML small stature 12 mm femoral component, a ceramic 32 mm outer diameter femoral head with a +1 neck length, a 50 mm outer diameter Gription 3 acetabular component with an apex hole eliminator, and a Marathon polyethylene liner +4 with a 10-degree posterior lip. Components were press-fit.  DESCRIPTION OF PROCEDURE:  Rose Campbell was met with the family in the holding area.  I identified the left hip as the appropriate operative site and marked it accordingly.  Any questions were answered.  The patient was then transported to room #5, placed on the operating room table, and placed under general anesthesia without difficulty. Nursing staff inserted a Foley catheter.  Urine was clear.  The patient was then carefully placed in the lateral decubitus position with the left side up and secured to the operating room table with the Innomed hip system.  A time-out was called.  The left hip was then prepped with chlorhexidine scrub and DuraPrep x2 from the iliac crest to the ankle.  Sterile draping was performed.  Time- out was called in.  A routine Southern incision was utilized and via sharp dissection carried down to the subcutaneous tissue.  There was abundant adipose tissue.  I used the Bovie to incise the adipose with excellent hemostasis.  Self-retaining  cerebellar retractors were inserted.  The iliotibial band was identified and carefully incised, retractors were placed more deeply.  I could palpate the sciatic nerve, which was well out of harm's way and was protected throughout the procedure.  Short external rotators were identified.  The tendinous structures were tagged with 0-Ethibond suture and then released from the posterior attachment of the greater trochanter.  The capsule was then incised along the femoral neck and head.  There was a clear yellow joint effusion of several mL.  The head was easily dislocated posteriorly and osteotomized at the head and neck junction using the oscillating saw and the calcar guide.  The head was devoid of articular cartilage and at least 70% to 80% of the surface.  There were some areas of beefy red synovitis consistent with the patient's history of systemic lupus erythematosus.  Retractors were then placed along the proximal femur.  Starter hole was then made in the piriformis fossa.  Canal finder was inserted.  Reaming was performed to 11.5 mm to accept a 12 mm component.  Rasping was performed sequentially to a 12 small stature femoral component.  Calcar cutter was used to obtain the appropriate calcar angle.  Neck cut was about a fingerbreadth proximal to the lesser trochanter.  Retractors were then placed  about the acetabulum.  The labrum was sharply excised from good exposure of the acetabulum.  Any soft tissue was removed with the Bovie.  Reaming was then performed sequentially to 49 mm to accept a 50 mm component.  I trialed a 48 with a tight rim fit, but it would completely seat.  The 50 mm had good rim fit, but it would not completely seat.  The acetabulum was irrigated.  I then inserted the final Gription 350 mm outer diameter metallic acetabular component using the external guide, though we had excellent position.  This was impacted in place, it was nice and tight.   Supplemental fixation was not necessary.  The trial polyethylene liner was inserted followed by the small stature 12 mm femoral component, and a 32 mm outer diameter hip ball with a +1 neck length.  This was reduced through a full range of motion, remained perfectly stable.  There was no evidence of impingement.  The trial components were then removed.  The joint copiously irrigated with saline solution.  The apex hole eliminator inserted followed by the final marathon polyethylene liner +4 with a 10-degree posterior lip. Wound was again irrigated with saline solution.  The small stature 12 mm femoral component was then impacted onto the calcar.  It was cleaned, the [facility] taper neck was dried, and a 32 mm outer diameter ceramic hip ball with a +1 neck length was then inserted. Acetabulum was inspected and was clean.  The entire construct was reduced and again through a full range of motion, I thought we had perfectly stable, I thought leg lengths remained symmetrical, and there was no evidence of instability.  I injected the deep capsule with 0.25% Marcaine with epinephrine, closed the capsule with a running 0-Ethibond suture and reattached the short external rotators with the same material.  I then applied topical tranexamic acid into the wound.  The iliotibial band was closed with a running #1 Vicryl, subcu in several layers with 2-0 Vicryl and 3-0 Monocryl.  Skin was closed with skin clips.  Sterile bulky dressing was applied followed by a Mepilex dressing.  The patient was placed in the supine position, knee immobilizer placed.  She was awoken, placed on the operating room stretcher, and returned to the postanesthesia recovery room without complications.  She did receive 2 g of Ancef preoperatively and 1000 mg of Ofirmev during the procedure.     Vonna Kotyk. Durward Fortes, M.D.     PWW/MEDQ  D:  08/03/2015  T:  08/03/2015  Job:  [id number]

## 2015-08-03 NOTE — Progress Notes (Signed)
Pt has history of seizures suction set up in room, pt alert and orientated to room with full understanding sister and mom at bedside

## 2015-08-03 NOTE — Progress Notes (Signed)
Orthopedic Tech Progress Note Patient Details:  Rose Campbell Jan 21, 1986 [id number]  Ortho Devices Ortho Device/Splint Location: trapeze bar patient helper Ortho Device/Splint Interventions: Application   Hildred Priest 08/03/2015, 3:12 PM

## 2015-08-03 NOTE — Op Note (Signed)
PATIENT ID:      Rose Campbell  MRN:     [id number] DOB/AGE:    08/27/1985 / 30 y.o.       OPERATIVE REPORT    DATE OF PROCEDURE:  08/03/2015       PREOPERATIVE DIAGNOSIS:END STAGE   LEFT HIP OSTEOARTHRITIS                                                       Estimated body mass index is 26.84 kg/(m^2) as calculated from the following:   Height as of this encounter: 5\' 1"  (1.549 m).   Weight as of this encounter: 64.411 kg (142 lb).     POSTOPERATIVE DIAGNOSIS:END STAGE   LEFT HIP OSTEOARTHRITIS                                                                     Estimated body mass index is 26.84 kg/(m^2) as calculated from the following:   Height as of this encounter: 5\' 1"  (1.549 m).   Weight as of this encounter: 64.411 kg (142 lb).     PROCEDURE:  Procedure(s): LEFT TOTAL HIP ARTHROPLASTY      SURGEON:  Joni Fears, MD    ASSISTANT:   Biagio Borg, PA-C   (Present and scrubbed throughout the case, critical for assistance with exposure, retraction, instrumentation, and closure.)          ANESTHESIA: general     DRAINS: none :      TOURNIQUET TIME: * No tourniquets in log *    COMPLICATIONS:  None   CONDITION:  stable  PROCEDURE IN DETAIL: [id number]  WHITFIELD, PETER W 08/03/2015, 9:12 AM

## 2015-08-03 NOTE — H&P (Signed)
  The recent History & Physical has been reviewed. I have personally examined the patient today. There is no interval change to the documented History & Physical. The patient would like to proceed with the procedure.  Rose Campbell W 08/03/2015,  7:08 AM

## 2015-08-04 ENCOUNTER — Encounter (HOSPITAL_COMMUNITY): Payer: Self-pay | Admitting: Orthopaedic Surgery

## 2015-08-04 LAB — BASIC METABOLIC PANEL
Anion gap: 9 (ref 5–15)
CHLORIDE: 102 mmol/L (ref 101–111)
CO2: 25 mmol/L (ref 22–32)
Calcium: 8 mg/dL — ABNORMAL LOW (ref 8.9–10.3)
Creatinine, Ser: 0.57 mg/dL (ref 0.44–1.00)
Glucose, Bld: 121 mg/dL — ABNORMAL HIGH (ref 65–99)
POTASSIUM: 3.3 mmol/L — AB (ref 3.5–5.1)
SODIUM: 136 mmol/L (ref 135–145)

## 2015-08-04 LAB — CBC
HCT: 28.7 % — ABNORMAL LOW (ref 36.0–46.0)
HEMOGLOBIN: 9.3 g/dL — AB (ref 12.0–15.0)
MCH: 30 pg (ref 26.0–34.0)
MCHC: 32.4 g/dL (ref 30.0–36.0)
MCV: 92.6 fL (ref 78.0–100.0)
Platelets: 352 10*3/uL (ref 150–400)
RBC: 3.1 MIL/uL — AB (ref 3.87–5.11)
RDW: 13 % (ref 11.5–15.5)
WBC: 9.5 10*3/uL (ref 4.0–10.5)

## 2015-08-04 MED ORDER — POTASSIUM CHLORIDE CRYS ER 20 MEQ PO TBCR
40.0000 meq | EXTENDED_RELEASE_TABLET | Freq: Two times a day (BID) | ORAL | Status: AC
  Administered 2015-08-04 (×2): 40 meq via ORAL
  Filled 2015-08-04 (×2): qty 2

## 2015-08-04 MED ORDER — OXYCODONE HCL 5 MG PO TABS: 5.0000 mg | ORAL_TABLET | ORAL | Status: DC | PRN

## 2015-08-04 MED ORDER — METHOCARBAMOL 500 MG PO TABS: 500.0000 mg | ORAL_TABLET | Freq: Three times a day (TID) | ORAL | Status: DC | PRN

## 2015-08-04 MED ORDER — RIVAROXABAN 10 MG PO TABS: 10.0000 mg | ORAL_TABLET | Freq: Every day | ORAL | Status: DC

## 2015-08-04 NOTE — Progress Notes (Signed)
OT Cancellation Note  Patient Details Name: Rose Campbell MRN: [id number] DOB: July 12, 1985   Cancelled Treatment:    Reason Eval/Treat Not Completed: Other (comment).  Family member just closed door as pt is sleeping.  Will plan to return early tomorrow prior to discharge  [place] 08/04/2015, 2:57 PM

## 2015-08-04 NOTE — Progress Notes (Signed)
Physical Therapy Treatment Patient Details Name: Rose Campbell MRN: [id number] DOB: 25-Aug-1985 Today's Date: 08/04/2015    History of Present Illness Pt is a 30 y/o female s/p L THR. PMH including but not limited to lupus.    PT Comments    Pt presented supine in bed with HOB elevated and sister present in room. Pt willing to participate in therapy session and ready for stair training. Pt's sister was present during stair training and was able to practice with assisting pt. Both pt and sister did very well with stair management, and had no questions or concerns. Pt would continue to benefit from skilled physical therapy services at this time while inpatient and after d/c.    Follow Up Recommendations  Home health PT     Equipment Recommendations  Rolling walker with 5" wheels;3in1 (PT);Wheelchair (measurements PT);Wheelchair cushion (measurements PT)    Recommendations for Other Services       Precautions / Restrictions Precautions Precautions: Posterior Hip Precaution Comments: PT gave pt posterior hip precautions paper and reviewed during session. Restrictions Weight Bearing Restrictions: Yes RLE Weight Bearing: Weight bearing as tolerated Other Position/Activity Restrictions: WBAT L LE    Mobility  Bed Mobility Overal bed mobility: Needs Assistance Bed Mobility: Supine to Sit;Sit to Supine     Supine to sit: Min guard;HOB elevated Sit to supine: Min guard   General bed mobility comments: Pt used her R LE to assist with movement of L LE back into bed  Transfers Overall transfer level: Needs assistance Equipment used: Rolling walker (2 wheeled) Transfers: Sit to/from Stand Sit to Stand: Min guard         General transfer comment: PT donned L knee immobilizer to pt prior to transfer and ambulation. Pt required increased time but was independent with appropriate hand placement  Ambulation/Gait Ambulation/Gait assistance: Min guard Ambulation Distance (Feet): 3  Feet (to recliner chair to be transported to stairs) Assistive device: Rolling walker (2 wheeled) Gait Pattern/deviations: Antalgic;Decreased step length - right;Decreased stance time - left Gait velocity: decreased Gait velocity interpretation: Below normal speed for age/gender     Stairs Stairs: Yes Stairs assistance: Min guard Stair Management: No rails;With walker Number of Stairs: 12 General stair comments: Pt required assistance with stabilizing RW while ascending stairs backwards and descending stairs forwards. Pt ascended with R LE leading and descended with L LE leading. Pt's sister also present during stair training and was able to practice assisting pt. Both pt and pt's sister reported no questions or concerns regarding stair management.  Wheelchair Mobility    Modified Rankin (Stroke Patients Only)       Balance Overall balance assessment: Needs assistance Sitting-balance support: No upper extremity supported;Feet supported Sitting balance-Leahy Scale: Good     Standing balance support: No upper extremity supported Standing balance-Leahy Scale: Fair                      Cognition Arousal/Alertness: Awake/alert Behavior During Therapy: WFL for tasks assessed/performed Overall Cognitive Status: Within Functional Limits for tasks assessed                      Exercises Total Joint Exercises Ankle Circles/Pumps: AROM;Strengthening;Both;10 reps Hip ABduction/ADduction: AROM;Strengthening;Left;10 reps;Other (comment) (did not go past neutral for adduction) Long Arc Quad: AROM;Strengthening;Left;10 reps Marching in Standing: AROM;Strengthening;Both;10 reps;Seated;Other (comment) (pt reclined back in chair, hip flex did not exceed 90 degree)    General Comments        Pertinent  Vitals/Pain Pain Assessment: No/denies pain Pain Score: 7  Pain Location: L hip Pain Descriptors / Indicators: Discomfort Pain Intervention(s): Limited activity within  patient's tolerance;Monitored during session;Repositioned    Home Living                      Prior Function            PT Goals (current goals can now be found in the care plan section) Acute Rehab PT Goals Patient Stated Goal: Pt would like to return to PLOF without pain. PT Goal Formulation: With patient Time For Goal Achievement: 08/10/15 Potential to Achieve Goals: Good Progress towards PT goals: Progressing toward goals    Frequency  7X/week    PT Plan Current plan remains appropriate    Co-evaluation             End of Session Equipment Utilized During Treatment: Gait belt Activity Tolerance: Patient tolerated treatment well Patient left: in bed;with call bell/phone within reach;with family/visitor present     Time: 1102-1130 PT Time Calculation (min) (ACUTE ONLY): 28 min  Charges:  $Gait Training: 23-37 mins $Therapeutic Exercise: 8-22 mins                    G CodesClearnce Sorrel Reginald Weida 2015/08/11, 12:09 PM Sherie Don, [place], DPT [phone]

## 2015-08-04 NOTE — Progress Notes (Signed)
Patient ID: Rose Campbell, female   DOB: Apr 20, 1985, 30 y.o.   MRN: [id number] PATIENT ID: Rose Campbell        MRN:  [id number]          DOB/AGE: 05-26-1985 / 30 y.o.    Joni Fears, MD   Biagio Borg, PA-C [address] [place], [place]  [address]                             [phone]   PROGRESS NOTE  Subjective:  negative for Chest Pain  negative for Shortness of Breath  negative for Nausea/Vomiting   negative for Calf Pain    Tolerating Diet: yes         Patient reports pain as mild.     Comfortable night-minimal pain  Objective: Vital signs in last 24 hours:   Patient Vitals for the past 24 hrs:  BP Temp Temp src Pulse Resp SpO2  08/04/15 0459 122/79 mmHg 98.6 F (37 C) Oral 88 16 98 %  08/04/15 0203 121/80 mmHg 99 F (37.2 C) Oral 96 16 97 %  08/03/15 1947 (!) 138/95 mmHg 98.8 F (37.1 C) Oral (!) 103 16 100 %  08/03/15 1414 (!) 141/95 mmHg 98.2 F (36.8 C) - - 16 98 %  08/03/15 1358 (!) 149/103 mmHg 98.3 F (36.8 C) - (!) 110 - 100 %  08/03/15 1345 - - - 83 (!) 21 100 %  08/03/15 1330 - - - 84 15 100 %  08/03/15 1328 (!) 142/106 mmHg - - 82 17 100 %  08/03/15 1315 - - - (!) 107 (!) 28 99 %  08/03/15 1300 - - - 96 20 100 %  08/03/15 1258 (!) 142/96 mmHg - - 96 (!) 22 99 %  08/03/15 1245 - - - 98 17 100 %  08/03/15 1230 - - - 90 14 100 %  08/03/15 1228 (!) 129/99 mmHg - - 91 18 100 %  08/03/15 1215 - - - 88 15 100 %  08/03/15 1200 - - - 90 18 100 %  08/03/15 1158 (!) 121/98 mmHg - - (!) 101 12 100 %  08/03/15 1145 - - - 87 14 100 %  08/03/15 1130 - - - (!) 115 (!) 23 100 %  08/03/15 1128 (!) 137/95 mmHg - - 91 12 100 %  08/03/15 1115 - - - 90 11 100 %  08/03/15 1100 - - - 87 12 100 %  08/03/15 1058 (!) 138/92 mmHg - - 88 10 100 %  08/03/15 1045 - 97.5 F (36.4 C) - 85 16 100 %  08/03/15 1043 (!) 143/96 mmHg - - 90 16 100 %  08/03/15 1030 - - - 83 14 100 %  08/03/15 1028 (!) 141/97 mmHg - - 85 (!) 9 100 %  08/03/15 1015 - - - 96 15 100 %    08/03/15 1014 (!) 136/95 mmHg - - 97 15 100 %  08/03/15 1013 (!) 140/106 mmHg - - 96 16 100 %  08/03/15 1000 - - - 96 (!) 21 100 %  08/03/15 0958 (!) 131/100 mmHg - - 99 17 100 %  08/03/15 0945 - - - (!) 118 11 100 %  08/03/15 0944 - 97.5 F (36.4 C) - - - -  08/03/15 0943 120/74 mmHg - - (!) 116 20 100 %      Intake/Output from previous day:   07/13 0701 - 07/14  0700 In: 1600 [I.V.:1600] Out: 3450 [Urine:3050]   Intake/Output this shift:       Intake/Output      07/13 0701 - 07/14 0700 07/14 0701 - 07/15 0700   I.V. (mL/kg) 1600 (24.8)    Total Intake(mL/kg) 1600 (24.8)    Urine (mL/kg/hr) 3050 (2)    Blood 400 (0.3)    Total Output 3450     Net -1850             LABORATORY DATA:  Recent Labs  08/04/15 0220  WBC 9.5  HGB 9.3*  HCT 28.7*  PLT 352    Recent Labs  08/04/15 0220  NA 136  K 3.3*  CL 102  CO2 25  BUN <5*  CREATININE 0.57  GLUCOSE 121*  CALCIUM 8.0*   Lab Results  Component Value Date   INR 1.11 07/21/2015    Recent Radiographic Studies :  Dg Chest 2 View  07/21/2015  CLINICAL DATA:  Preoperative examination prior to hip replacement, history of lupus, nonsmoker. EXAM: CHEST  2 VIEW COMPARISON:  None in PACs FINDINGS: The lungs are adequately inflated and clear. The heart and pulmonary vascularity are normal. The mediastinum is normal in width. There is no pleural effusion. The bony thorax is unremarkable. A nipple ring is present on the left. IMPRESSION: There is no active cardiopulmonary disease. Electronically Signed   By: David  Martinique M.D.   On: 07/21/2015 10:00   Dg Hip Port Unilat With Pelvis 1v Left  08/03/2015  CLINICAL DATA:  Post LEFT hip replacement EXAM: DG HIP (WITH OR WITHOUT PELVIS) 1V PORT LEFT COMPARISON:  Portable exam 1017 hours compared to 06/05/2015 FINDINGS: Components of a LEFT hip prosthesis are identified. No acute fracture, dislocation or bone destruction. Advanced osteoarthritic changes RIGHT hip. Marked osseous  demineralization. IMPRESSION: LEFT hip prosthesis without acute complication. Osseous demineralization with advanced osteoarthritic changes of RIGHT hip joint. Electronically Signed   By: Lavonia Dana M.D.   On: 08/03/2015 12:25     Examination:  General appearance: alert, cooperative and no distress  Wound Exam: clean, dry, intact   Drainage:  None: wound tissue dry  Motor Exam: EHL, FHL, Anterior Tibial and Posterior Tibial Intact  Sensory Exam: Superficial Peroneal, Deep Peroneal and Tibial normal  Vascular Exam: Normal  Assessment:    1 Day Post-Op  Procedure(s) (LRB): LEFT TOTAL HIP ARTHROPLASTY (Left)  ADDITIONAL DIAGNOSIS:  Principal Problem:   Osteoarthritis of right hip Active Problems:   Latex allergy   S/P total hip arthroplasty  Acute Blood Loss Anemia and Hypokalemia   Plan: Physical Therapy as ordered Weight Bearing as Tolerated (WBAT)  DVT Prophylaxis:  Xarelto, Foot Pumps and TED hose  DISCHARGE PLAN: Home  DISCHARGE NEEDS: HHPT, Walker and 3-in-1 comode seat   D/C foley, OOB with PT, saline lock IV, K supplement, hope for D/C tomorrow     Joni Fears W  08/04/2015 7:32 AM               .      :

## 2015-08-04 NOTE — Progress Notes (Signed)
Physical Therapy Treatment Patient Details Name: Rose Campbell MRN: [id number] DOB: [birth year] Today's Date: 08/04/2015    History of Present Illness Pt is a 30 y/o female s/p L THR. PMH including but not limited to lupus.    PT Comments    Pt presented sitting upright in recliner with sister and mother present in room. She stated that she felt much better today as compared to yesterday. She had improved tolerance to therapeutic interventions and progressed to ambulation with assistance. Pt still needs to stair train at next session prior to d/c. Pt would continue to benefit from skilled physical therapy services at this time while inpatient and after d/c.    Follow Up Recommendations  Home health PT     Equipment Recommendations  Rolling walker with 5" wheels;3in1 (PT);Wheelchair (measurements PT);Wheelchair cushion (measurements PT)    Recommendations for Other Services       Precautions / Restrictions Precautions Precautions: Posterior Hip Precaution Comments: PT gave pt posterior hip precautions paper and reviewed during session. Restrictions Weight Bearing Restrictions: Yes Other Position/Activity Restrictions: WBAT L LE    Mobility  Bed Mobility               General bed mobility comments: Pt presented sitting upright in bedside recliner at beginning of session and returned to recliner at the end of the session.  Transfers Overall transfer level: Needs assistance Equipment used: Rolling walker (2 wheeled) Transfers: Sit to/from Stand Sit to Stand: Min guard         General transfer comment: PT donned L knee immobilizer to pt prior to transfer and ambulation. Pt required increased time but was independent with appropriate hand placement  Ambulation/Gait Ambulation/Gait assistance: Min guard Ambulation Distance (Feet): 200 Feet Assistive device: Rolling walker (2 wheeled) Gait Pattern/deviations: Antalgic;Decreased weight shift to left;Decreased stance  time - left;Decreased step length - right Gait velocity: decreased Gait velocity interpretation: Below normal speed for age/gender     Stairs            Wheelchair Mobility    Modified Rankin (Stroke Patients Only)       Balance Overall balance assessment: Needs assistance Sitting-balance support: No upper extremity supported;Feet supported Sitting balance-Leahy Scale: Good     Standing balance support: Bilateral upper extremity supported Standing balance-Leahy Scale: Poor                      Cognition Arousal/Alertness: Awake/alert Behavior During Therapy: WFL for tasks assessed/performed Overall Cognitive Status: Within Functional Limits for tasks assessed                      Exercises Total Joint Exercises Ankle Circles/Pumps: AROM;Strengthening;Both;10 reps Hip ABduction/ADduction: AROM;Strengthening;Left;10 reps;Other (comment) (did not go past neutral for adduction) Long Arc Quad: AROM;Strengthening;Left;10 reps Marching in Standing: AROM;Strengthening;Both;10 reps;Seated;Other (comment) (pt reclined back in chair, hip flex did not exceed 90 degree)    General Comments        Pertinent Vitals/Pain Pain Assessment: 0-10 Pain Score: 7  Pain Location: L hip Pain Descriptors / Indicators: Discomfort Pain Intervention(s): Limited activity within patient's tolerance;Monitored during session;Repositioned    Home Living                      Prior Function            PT Goals (current goals can now be found in the care plan section) Acute Rehab PT Goals Patient Stated Goal: Pt would  like to return to PLOF without pain. PT Goal Formulation: With patient Time For Goal Achievement: 08/10/15 Potential to Achieve Goals: Good Progress towards PT goals: Progressing toward goals    Frequency  7X/week    PT Plan Current plan remains appropriate    Co-evaluation             End of Session Equipment Utilized During  Treatment: Gait belt Activity Tolerance: Patient limited by pain Patient left: in chair;with call bell/phone within reach;with family/visitor present     Time: 0828-0852 PT Time Calculation (min) (ACUTE ONLY): 24 min  Charges:  $Gait Training: 8-22 mins $Therapeutic Exercise: 8-22 mins                    G CodesClearnce Sorrel Shavaun Osterloh Aug 24, 2015, 9:59 AM Sherie Don, [place], DPT [phone]

## 2015-08-04 NOTE — Care Management Note (Signed)
Case Management Note  Patient Details  Name: Rose Campbell MRN: [id number] Date of Birth: 1985-11-15  Subjective/Objective:                    Action/Plan:   Expected Discharge Date:                  Expected Discharge Plan:  [place]  In-House Referral:     Discharge planning Services  CM Consult  Post Acute Care Choice:  Durable Medical Equipment, Home Health Choice offered to:  Patient  DME Arranged:  3-N-1, Walker rolling, Wheelchair manual DME Agency:  [place]:  PT [place]:  [facility] (now [facility])  Status of Service:  Completed, signed off  If discussed at [employer], dates discussed:    Additional Comments:  Marilu Favre, RN 08/04/2015, 11:35 AM

## 2015-08-04 NOTE — Discharge Summary (Signed)
Joni Fears, MD   Biagio Borg, PA-C [address], [place], [place]  [address]                             [phone]  PATIENT ID: Rose Campbell        MRN:  [id number]          DOB/AGE: October 26, 1985 / 30 y.o.    DISCHARGE SUMMARY  ADMISSION DATE:    08/03/2015 DISCHARGE DATE:   08/04/2015   ADMISSION DIAGNOSIS: LEFT HIP OSTEOARTHRITIS    DISCHARGE DIAGNOSIS:  LEFT HIP OSTEOARTHRITIS    ADDITIONAL DIAGNOSIS: Principal Problem:   Osteoarthritis of right hip Active Problems:   Latex allergy   S/P total hip arthroplasty  Past Medical History  Diagnosis Date  . Lupus ([place])   . Polyarthralgia     takes Humira and Methotrexate  . Seizures ([place])     hx of-as a child. No meds  . Arthritis   . Joint pain   . Joint swelling   . Depression     takes Cymbalta daily    PROCEDURE: Procedure(s): LEFT TOTAL HIP ARTHROPLASTY  on 08/03/2015  CONSULTS: none     HISTORY:  See H&P in chart  HOSPITAL COURSE:  Rose Campbell is a 30 y.o. admitted on 08/03/2015 and found to have a diagnosis of LEFT HIP OSTEOARTHRITIS.  After appropriate laboratory studies were obtained  they were taken to the operating room on 08/03/2015 and underwent  Procedure(s): LEFT TOTAL HIP ARTHROPLASTY  .   They were given perioperative antibiotics:  Anti-infectives    Start     Dose/Rate Route Frequency Ordered Stop   08/03/15 1500  ceFAZolin (ANCEF) IVPB 2g/100 mL premix     2 g 200 mL/hr over 30 Minutes Intravenous Every 6 hours 08/03/15 1427 08/03/15 2052   08/03/15 0700  ceFAZolin (ANCEF) IVPB 2g/100 mL premix     2 g 200 mL/hr over 30 Minutes Intravenous To ShortStay Surgical 08/02/15 1414 08/03/15 0727    .  Tolerated the procedure well.  Placed with a foley intraoperatively.     Toradol was given post op.  POD #1, allowed out of bed to a chair.  PT for ambulation and exercise program.  Foley D/C'd in morning.  IV saline locked.  O2 discontionued.  POD #2, continued PT and ambulation.   Hypokalemia noted  The remainder of the hospital course was dedicated to ambulation and strengthening.   The patient was discharged on 1 Day Post-Op in  Stable condition.  Blood products given:none  DIAGNOSTIC STUDIES: Recent vital signs:  Patient Vitals for the past 24 hrs:  BP Temp Temp src Pulse Resp SpO2  08/04/15 1333 114/64 mmHg 98.4 F (36.9 C) Oral (!) 102 - 98 %  08/04/15 0459 122/79 mmHg 98.6 F (37 C) Oral 88 16 98 %  08/04/15 0203 121/80 mmHg 99 F (37.2 C) Oral 96 16 97 %  08/03/15 1947 (!) 138/95 mmHg 98.8 F (37.1 C) Oral (!) 103 16 100 %  08/03/15 1414 (!) 141/95 mmHg 98.2 F (36.8 C) - - 16 98 %       Recent laboratory studies:  Recent Labs  08/04/15 0220  WBC 9.5  HGB 9.3*  HCT 28.7*  PLT 352    Recent Labs  08/04/15 0220  NA 136  K 3.3*  CL 102  CO2 25  BUN <5*  CREATININE 0.57  GLUCOSE 121*  CALCIUM  8.0*   Lab Results  Component Value Date   INR 1.11 07/21/2015     Recent Radiographic Studies :  Dg Chest 2 View  07/21/2015  CLINICAL DATA:  Preoperative examination prior to hip replacement, history of lupus, nonsmoker. EXAM: CHEST  2 VIEW COMPARISON:  None in PACs FINDINGS: The lungs are adequately inflated and clear. The heart and pulmonary vascularity are normal. The mediastinum is normal in width. There is no pleural effusion. The bony thorax is unremarkable. A nipple ring is present on the left. IMPRESSION: There is no active cardiopulmonary disease. Electronically Signed   By: David  Martinique M.D.   On: 07/21/2015 10:00   Dg Hip Port Unilat With Pelvis 1v Left  08/03/2015  CLINICAL DATA:  Post LEFT hip replacement EXAM: DG HIP (WITH OR WITHOUT PELVIS) 1V PORT LEFT COMPARISON:  Portable exam 1017 hours compared to 06/05/2015 FINDINGS: Components of a LEFT hip prosthesis are identified. No acute fracture, dislocation or bone destruction. Advanced osteoarthritic changes RIGHT hip. Marked osseous demineralization. IMPRESSION: LEFT hip prosthesis  without acute complication. Osseous demineralization with advanced osteoarthritic changes of RIGHT hip joint. Electronically Signed   By: Lavonia Dana M.D.   On: 08/03/2015 12:25    DISCHARGE INSTRUCTIONS:     Discharge Instructions    Call MD / Call 911    Complete by:  As directed   If you experience chest pain or shortness of breath, CALL 911 and be transported to the hospital emergency room.  If you develope a fever above 101 F, pus (white drainage) or increased drainage or redness at the wound, or calf pain, call your surgeon's office.     Change dressing    Complete by:  As directed   DO NOT CHANGE THE DRESSING     Constipation Prevention    Complete by:  As directed   Drink plenty of fluids.  Prune juice may be helpful.  You may use a stool softener, such as Colace (over the counter) 100 mg twice a day.  Use MiraLax (over the counter) for constipation as needed.     Diet general    Complete by:  As directed      Discharge instructions    Complete by:  As directed   [place] items at home which could result in a fall. This includes throw rugs or furniture in walking pathways ICE to the affected joint every three hours while awake for 30 minutes at a time, for at least the first 3-5 days, and then as needed for pain and swelling.  Continue to use ice for pain and swelling. You may notice swelling that will progress down to the foot and ankle.  This is normal after surgery.  Elevate your leg when you are not up walking on it.   Continue to use the breathing machine you got in the hospital (incentive spirometer) which will help keep your temperature down.  It is common for your temperature to cycle up and down following surgery, especially at night when you are not up moving around and exerting yourself.  The breathing machine keeps your lungs expanded and your temperature down.   DIET:  As you were doing prior to hospitalization, we recommend a  well-balanced diet.  DRESSING / WOUND CARE / SHOWERING  Keep the surgical dressing until follow up.  The dressing is water proof, so you can shower without any extra covering.  IF THE DRESSING FALLS OFF or the  wound gets wet inside, change the dressing with sterile gauze.  Please use good hand washing techniques before changing the dressing.  Do not use any lotions or creams on the incision until instructed by your surgeon.    ACTIVITY  Increase activity slowly as tolerated, but follow the weight bearing instructions below.   No driving for 6 weeks or until further direction given by your physician.  You cannot drive while taking narcotics.  No lifting or carrying greater than 10 lbs. until further directed by your surgeon. Avoid periods of inactivity such as sitting longer than an hour when not asleep. This helps prevent blood clots.  You may return to work once you are authorized by your doctor.     WEIGHT BEARING   Weight bearing as tolerated with assist device (walker, cane, etc) as directed, use it as long as suggested by your surgeon or therapist, typically at least 4-6 weeks.   EXERCISES  Results after joint replacement surgery are often greatly improved when you follow the exercise, range of motion and muscle strengthening exercises prescribed by your doctor. Safety measures are also important to protect the joint from further injury. Any time any of these exercises cause you to have increased pain or swelling, decrease what you are doing until you are comfortable again and then slowly increase them. If you have problems or questions, call your caregiver or physical therapist for advice.   Rehabilitation is important following a joint replacement. After just a few days of immobilization, the muscles of the leg can become weakened and shrink (atrophy).  These exercises are designed to build up the tone and strength of the thigh and leg muscles and to improve motion. Often times heat  used for twenty to thirty minutes before working out will loosen up your tissues and help with improving the range of motion but do not use heat for the first two weeks following surgery (sometimes heat can increase post-operative swelling).   These exercises can be done on a training (exercise) mat, on a table or on a bed. Use whatever works the best and is most comfortable for you.    Use music or television while you are exercising so that the exercises are a pleasant break in your day. This will make your life better with the exercises acting as a break in your routine that you can look forward to.   Perform all exercises about fifteen times, three times per day or as directed.  You should exercise both the operative leg and the other leg as well.   Exercises include:  Quad Sets - Tighten up the muscle on the front of the thigh (Quad) and hold for 5-10 seconds.   Straight Leg Raises - With your knee straight (if you were given a brace, keep it on), lift the leg to 60 degrees, hold for 3 seconds, and slowly lower the leg.  Perform this exercise against resistance later as your leg gets stronger.  Leg Slides: Lying on your back, slowly slide your foot toward your buttocks, bending your knee up off the floor (only go as far as is comfortable). Then slowly slide your foot back down until your leg is flat on the floor again.  Angel Wings: Lying on your back spread your legs to the side as far apart as you can without causing discomfort.  Hamstring Strength:  Lying on your back, push your heel against the floor with your leg straight by tightening up the muscles of your  buttocks.  Repeat, but this time bend your knee to a comfortable angle, and push your heel against the floor.  You may put a pillow under the heel to make it more comfortable if necessary.   A rehabilitation program following joint replacement surgery can speed recovery and prevent re-injury in the future due to weakened muscles. Contact  your doctor or a physical therapist for more information on knee rehabilitation.    CONSTIPATION  Constipation is defined medically as fewer than three stools per week and severe constipation as less than one stool per week.  Even if you have a regular bowel pattern at home, your normal regimen is likely to be disrupted due to multiple reasons following surgery.  Combination of anesthesia, postoperative narcotics, change in appetite and fluid intake all can affect your bowels.   YOU MUST use at least one of the following options; they are listed in order of increasing strength to get the job done.  They are all available over the counter, and you may need to use some, POSSIBLY even all of these options:    Drink plenty of fluids (prune juice may be helpful) and high fiber foods Colace 100 mg by mouth twice a day  Senokot for constipation as directed and as needed Dulcolax (bisacodyl), take with full glass of water  Miralax (polyethylene glycol) once or twice a day as needed.  If you have tried all these things and are unable to have a bowel movement in the first 3-4 days after surgery call either your surgeon or your primary doctor.    If you experience loose stools or diarrhea, hold the medications until you stool forms back up.  If your symptoms do not get better within 1 week or if they get worse, check with your doctor.  If you experience "the worst abdominal pain ever" or develop nausea or vomiting, please contact the office immediately for further recommendations for treatment.   ITCHING:  If you experience itching with your medications, try taking only a single pain pill, or even half a pain pill at a time.  You can also use Benadryl over the counter for itching or also to help with sleep.   TED HOSE STOCKINGS:  Use stockings on both legs until for at least 2 weeks or as directed by physician office. They may be removed at night for sleeping.  MEDICATIONS:  See your medication summary  on the "After Visit Summary" that nursing will review with you.  You may have some home medications which will be placed on hold until you complete the course of blood thinner medication.  It is important for you to complete the blood thinner medication as prescribed.  PRECAUTIONS:  If you experience chest pain or shortness of breath - call 911 immediately for transfer to the hospital emergency department.   If you develop a fever greater that 101 F, purulent drainage from wound, increased redness or drainage from wound, foul odor from the wound/dressing, or calf pain - CONTACT YOUR SURGEON.                                                   FOLLOW-UP APPOINTMENTS:  If you do not already have a post-op appointment, please call the office for an appointment to be seen by your surgeon.  Guidelines for how soon to be  seen are listed in your "After Visit Summary", but are typically between 1-4 weeks after surgery.  OTHER INSTRUCTIONS:   Knee Replacement:  Do not place pillow under knee, focus on keeping the knee straight while resting. CPM instructions: 0-90 degrees, 2 hours in the morning, 2 hours in the afternoon, and 2 hours in the evening. Place foam block, curve side up under heel at all times except when in CPM or when walking.  DO NOT modify, tear, cut, or change the foam block in any way.  MAKE SURE YOU:  Understand these instructions.  Get help right away if you are not doing well or get worse.    Thank you for letting us be a part of your medical care team.  It is a privilege we respect greatly.  We hope these instructions will help you stay on track for a fast and full recovery!     Driving restrictions    Complete by:  As directed   No driving for 6 weeks     Follow the hip precautions as taught in Physical Therapy    Complete by:  As directed      Increase activity slowly as tolerated    Complete by:  As directed      Lifting restrictions    Complete by:  As directed   No lifting  for 6 weeks     Patient may shower    Complete by:  As directed   You may shower over the brown dressing     TED hose    Complete by:  As directed   Use stockings (TED hose) for 3 weeks on left leg.  You may remove them at night for sleeping.     Weight bearing as tolerated    Complete by:  As directed   Laterality:  left  Extremity:  Lower           DISCHARGE MEDICATIONS:     Medication List    STOP taking these medications        HUMIRA PEN [place]     meloxicam 15 MG tablet  Commonly known as:  MOBIC     methotrexate 2.5 MG tablet  Commonly known as:  RHEUMATREX     predniSONE 20 MG tablet  Commonly known as:  DELTASONE      TAKE these medications        DULoxetine 60 MG capsule  Commonly known as:  CYMBALTA  Take 1 capsule (60 mg total) by mouth 2 (two) times daily.     folic acid 1 MG tablet  Commonly known as:  FOLVITE  Take 1 mg by mouth daily.     methocarbamol 500 MG tablet  Commonly known as:  ROBAXIN  Take 1 tablet (500 mg total) by mouth every 8 (eight) hours as needed for muscle spasms.     ORTHO TRI-CYCLEN LO PO  Take 1 tablet by mouth daily.     oxyCODONE 5 MG immediate release tablet  Commonly known as:  Oxy IR/ROXICODONE  Take 1-2 tablets (5-10 mg total) by mouth every 4 (four) hours as needed for breakthrough pain.     rivaroxaban 10 MG Tabs tablet  Commonly known as:  XARELTO  Take 1 tablet (10 mg total) by mouth daily with breakfast.        FOLLOW UP VISIT:   Follow-up Information    Follow up with Garald Balding, MD On 08/18/2015.   Specialty:  Orthopedic Surgery  Contact information:   [address] [place] [address] [phone]       DISPOSITION:   Home  CONDITION:  Stable   Mike Craze. [place], [place] [phone]  08/04/2015 2:08 PM

## 2015-08-04 NOTE — Discharge Instructions (Signed)
Information on my medicine - XARELTO (Rivaroxaban)  This medication education was reviewed with me or my healthcare representative as part of my discharge preparation.  The pharmacist that spoke with me during my hospital stay was:  Pat Patrick, [facility]  Why was Xarelto prescribed for you? Xarelto was prescribed for you to reduce the risk of blood clots forming after orthopedic surgery. The medical term for these abnormal blood clots is venous thromboembolism (VTE).  What do you need to know about xarelto ? Take your Xarelto ONCE DAILY at the same time every day. You may take it either with or without food.  If you have difficulty swallowing the tablet whole, you may crush it and mix in applesauce just prior to taking your dose.  Take Xarelto exactly as prescribed by your doctor and DO NOT stop taking Xarelto without talking to the doctor who prescribed the medication.  Stopping without other VTE prevention medication to take the place of Xarelto may increase your risk of developing a clot.  After discharge, you should have regular check-up appointments with your healthcare provider that is prescribing your Xarelto.    What do you do if you miss a dose? If you miss a dose, take it as soon as you remember on the same day then continue your regularly scheduled once daily regimen the next day. Do not take two doses of Xarelto on the same day.   Important Safety Information A possible side effect of Xarelto is bleeding. You should call your healthcare provider right away if you experience any of the following: ? Bleeding from an injury or your nose that does not stop. ? Unusual colored urine (red or dark brown) or unusual colored stools (red or black). ? Unusual bruising for unknown reasons. ? A serious fall or if you hit your head (even if there is no bleeding).  Some medicines may interact with Xarelto and might increase your risk of bleeding while on Xarelto. To help avoid  this, consult your healthcare provider or pharmacist prior to using any new prescription or non-prescription medications, including herbals, vitamins, non-steroidal anti-inflammatory drugs (NSAIDs) and supplements.  This website has more information on Xarelto: [email or web address]

## 2015-08-05 LAB — CBC
HCT: 26.5 % — ABNORMAL LOW (ref 36.0–46.0)
HEMOGLOBIN: 8.4 g/dL — AB (ref 12.0–15.0)
MCH: 29.9 pg (ref 26.0–34.0)
MCHC: 31.7 g/dL (ref 30.0–36.0)
MCV: 94.3 fL (ref 78.0–100.0)
PLATELETS: 325 10*3/uL (ref 150–400)
RBC: 2.81 MIL/uL — AB (ref 3.87–5.11)
RDW: 13.5 % (ref 11.5–15.5)
WBC: 9.5 10*3/uL (ref 4.0–10.5)

## 2015-08-05 LAB — BASIC METABOLIC PANEL
ANION GAP: 6 (ref 5–15)
BUN: <5 mg/dL — ABNORMAL LOW (ref 6–20)
CALCIUM: 8 mg/dL — AB (ref 8.9–10.3)
CO2: 31 mmol/L (ref 22–32)
CREATININE: 0.49 mg/dL (ref 0.44–1.00)
Chloride: 103 mmol/L (ref 101–111)
GLUCOSE: 98 mg/dL (ref 65–99)
Potassium: 2.9 mmol/L — ABNORMAL LOW (ref 3.5–5.1)
Sodium: 140 mmol/L (ref 135–145)

## 2015-08-05 MED ORDER — POTASSIUM CHLORIDE CRYS ER 20 MEQ PO TBCR
40.0000 meq | EXTENDED_RELEASE_TABLET | Freq: Once | ORAL | Status: AC
  Administered 2015-08-05: 40 meq via ORAL
  Filled 2015-08-05: qty 2

## 2015-08-05 NOTE — Progress Notes (Signed)
Subjective: 2 Days Post-Op Procedure(s) (LRB): LEFT TOTAL HIP ARTHROPLASTY (Left) Patient reports pain as moderate.    Objective: Vital signs in last 24 hours: Temp:  [98.4 F (36.9 C)-98.5 F (36.9 C)] 98.4 F (36.9 C) (07/15 0402) Pulse Rate:  [102-118] 116 (07/15 0402) Resp:  [16] 16 (07/15 0402) BP: (114-121)/(64-84) 121/84 mmHg (07/15 0402) SpO2:  [94 %-98 %] 97 % (07/15 0402)  Intake/Output from previous day: 07/14 0701 - 07/15 0700 In: 240 [P.O.:240] Out: -  Intake/Output this shift:     Recent Labs  08/04/15 0220 08/05/15 0544  HGB 9.3* 8.4*    Recent Labs  08/04/15 0220 08/05/15 0544  WBC 9.5 9.5  RBC 3.10* 2.81*  HCT 28.7* 26.5*  PLT 352 325    Recent Labs  08/04/15 0220 08/05/15 0544  NA 136 140  K 3.3* 2.9*  CL 102 103  CO2 25 31  BUN <5* <5*  CREATININE 0.57 0.49  GLUCOSE 121* 98  CALCIUM 8.0* 8.0*   No results for input(s): LABPT, INR in the last 72 hours.  Neurologically intact  Assessment/Plan: 2 Days Post-Op Procedure(s) (LRB): LEFT TOTAL HIP ARTHROPLASTY (Left) Discharge home with home health  Hillis Mcphatter C 08/05/2015, 9:00 AM

## 2015-08-05 NOTE — Progress Notes (Signed)
CM received call to please have DMe delivered.  CM called [facility] DME rep, Germaine to please deliver the 3n1 and rolling walker to room so pt can go home. Pt is setup with [facility] for HHPT.  No other CM needs were communicated.

## 2015-08-05 NOTE — Plan of Care (Signed)
Physical Therapy Treatment Patient Details Name: Page Spiroetrice Suarez MRN: [id number] DOB: 1985-04-18 Today's Date: 08/05/2015    History of Present Illness Pt is a 30 y/o female s/p L THR. PMH including but not limited to lupus.    PT Comments    Patient tolerated treatment well. She ambualted with a slow pattern. She was encouraged to put more weight on the L LE. She will be going home with home health. PT reviewed exercises with the patient.   Follow Up Recommendations  Home health PT     Equipment Recommendations  Rolling walker with 5" wheels;3in1 (PT);Wheelchair (measurements PT);Wheelchair cushion (measurements PT)    Recommendations for Other Services       Precautions / Restrictions Precautions Precautions: Posterior Hip Precaution Comments: pt able to recall 3/3 precautions with min cuing  Restrictions Other Position/Activity Restrictions: WBAT L LE    Mobility  Bed Mobility               General bed mobility comments: Pt OOB in chair  Transfers Overall transfer level: Needs assistance Equipment used: Rolling walker (2 wheeled)   Sit to Stand: Supervision Stand pivot transfers: Supervision       General transfer comment: supervision for intial balance   Ambulation/Gait Ambulation/Gait assistance: Min guard Ambulation Distance (Feet): 200 Feet Assistive device: Rolling walker (2 wheeled) Gait Pattern/deviations: Step-to pattern;Decreased step length - left;Decreased stance time - left;Decreased weight shift to left Gait velocity: decreased       Stairs   Stairs assistance: Supervision     General stair comments: Cuing for hand placement on walker otherwisde supervison for gait.   Wheelchair Mobility    Modified Rankin (Stroke Patients Only)       Balance   Sitting-balance support: No upper extremity supported Sitting balance-Leahy Scale: Good     Standing balance support: Bilateral upper extremity supported Standing balance-Leahy  Scale: Poor                      Cognition Arousal/Alertness: Awake/alert Behavior During Therapy: WFL for tasks assessed/performed Overall Cognitive Status: Within Functional Limits for tasks assessed                      Exercises Total Joint Exercises Ankle Circles/Pumps: AROM;Strengthening;Both;10 reps Quad Sets: 10 reps Gluteal Sets: 10 reps Heel Slides: 10 reps (below 90 degrees) Long Arc Quad: 20 reps Marching in Standing: 15 reps    General Comments        Pertinent Vitals/Pain Pain Assessment: 0-10 Pain Score: 7  Pain Location: L hip Pain Descriptors / Indicators: Aching Pain Intervention(s): Limited activity within patient's tolerance;Monitored during session;Premedicated before session;Relaxation    Home Living                      Prior Function            PT Goals (current goals can now be found in the care plan section) Acute Rehab PT Goals Patient Stated Goal: Patient found in a chair. She is willing to aprticipate in therapy.  PT Goal Formulation: With patient Time For Goal Achievement: 08/10/15 Potential to Achieve Goals: Good Progress towards PT goals: Progressing toward goals    Frequency  7X/week    PT Plan Current plan remains appropriate    Co-evaluation             End of Session Equipment Utilized During Treatment: Gait belt Activity Tolerance: Patient tolerated treatment well  Patient left: with call bell/phone within reach;with family/visitor present;in chair     Time: [id number] PT Time Calculation (min) (ACUTE ONLY): 28 min  Charges:  $Gait Training: 8-22 mins $Therapeutic Exercise: 8-22 mins                    G Codes:      Carney Living PT DPT  08/05/2015, 4:04 PM

## 2015-08-05 NOTE — Progress Notes (Signed)
Occupational Therapy Evaluation Patient Details Name: Anacarolina Evelyn MRN: [id number] DOB: 1985/05/21 Today's Date: 08/05/2015    History of Present Illness Pt is a 30 y/o female s/p L THR. PMH including but not limited to lupus.   Clinical Impression   Completed acute education on posterior hip precautions for ADL and functional mobility for ADL with AE and DME. Pt needs 3 in1 and RW for D/C. Recommend follow up with HHOT as well to assist pt to return to PLOF.    Follow Up Recommendations  Home health OT;Supervision - Intermittent (S for mobility and assist with ADL)    Equipment Recommendations  3 in 1 bedside comode;Other (comment) (RW; AE hip kit)    Recommendations for Other Services       Precautions / Restrictions Precautions Precautions: Posterior Hip Precaution Comments: pt able to recall 3/3 precautions Restrictions Weight Bearing Restrictions: Yes RLE Weight Bearing: Weight bearing as tolerated Other Position/Activity Restrictions: WBAT L LE      Mobility Bed Mobility         Supine to sit:  (with L LE)     General bed mobility comments: Pt OOB in chair  Transfers Overall transfer level: Needs assistance Equipment used: Rolling walker (2 wheeled)   Sit to Stand: Supervision Stand pivot transfers: Supervision            Balance     Sitting balance-Leahy Scale: Good       Standing balance-Leahy Scale: Fair                              ADL Overall ADL's : Needs assistance/impaired                                     Functional mobility during ADLs: Supervision/safety;Rolling walker General ADL Comments: Completed educaiton regarding posterior hip precuations and use of AE and DME for ADL and funcitonal mobility for ADL. Completed ADL session with pt able to return demosntrate useof AE for dressing with min vc. Sister present for session. Educated sister on use of 3 in1 for tub seat and handout given. Recommended  pt purchase AE kit.      Vision     Perception     Praxis      Pertinent Vitals/Pain Pain Assessment: 0-10 Pain Score: 5  Pain Location: L hip Pain Descriptors / Indicators: Aching Pain Intervention(s): Limited activity within patient's tolerance     Hand Dominance     Extremity/Trunk Assessment Upper Extremity Assessment Upper Extremity Assessment: Overall WFL for tasks assessed   Lower Extremity Assessment Lower Extremity Assessment: Defer to PT evaluation LLE Deficits / Details: Pt with decreased strength and ROM limitations secondary to post-op. Pt's sensation is grossly intact.   Cervical / Trunk Assessment Cervical / Trunk Assessment: Normal   Communication Communication Communication: No difficulties   Cognition Arousal/Alertness: Lethargic;Suspect due to medications Behavior During Therapy: WFL for tasks assessed/performed Overall Cognitive Status: Within Functional Limits for tasks assessed                     General Comments       Exercises       Shoulder Instructions      Home Living Family/patient expects to be discharged to:: Private residence Living Arrangements: Other (Comment) (sister) Available Help at Discharge: Family Type of Home: [place]  Access: Level entry     Home Layout: Two level;Bed/bath upstairs Alternate Level Stairs-Number of Steps: full flight Alternate Level Stairs-Rails: None Bathroom Shower/Tub: Tub/shower unit Shower/tub characteristics: Architectural technologist: Standard Bathroom Accessibility: Yes How Accessible: Accessible via walker Home Equipment: None          Prior Functioning/Environment Level of Independence: Independent             OT Diagnosis: Generalized weakness;Acute pain   OT Problem List: Decreased strength;Decreased range of motion;Decreased activity tolerance;Decreased knowledge of use of DME or AE;Pain   OT Treatment/Interventions: Self-care/ADL training;DME and/or AE  instruction;Patient/family education;Therapeutic activities    OT Goals(Current goals can be found in the care plan section) Acute Rehab OT Goals Patient Stated Goal: Pt would like to return to PLOF without pain. OT Goal Formulation: All assessment and education complete, DC therapy (scute ot)  OT Frequency:     Barriers to D/C:            Co-evaluation              End of Session Equipment Utilized During Treatment: Rolling walker Nurse Communication: Mobility status;Other (comment) (DMA needs)  Activity Tolerance: Patient tolerated treatment well Patient left: in chair;with call bell/phone within reach;with family/visitor present   Time: 1610-9604 OT Time Calculation (min): 30 min Charges:  OT General Charges $OT Visit: 1 Procedure OT Evaluation $OT Eval Low Complexity: 1 Procedure OT Treatments $Self Care/Home Management : 8-22 mins G-Codes:    Lynnix Schoneman,HILLARY 08-11-15, 10:40 AM   Maurie Boettcher, OTR/L  [phone] 08/11/15

## 2015-08-28 ENCOUNTER — Other Ambulatory Visit: Payer: Self-pay | Admitting: Family Medicine

## 2015-08-28 DIAGNOSIS — F329 Major depressive disorder, single episode, unspecified: Secondary | ICD-10-CM

## 2015-08-28 DIAGNOSIS — F32A Depression, unspecified: Secondary | ICD-10-CM

## 2015-09-23 ENCOUNTER — Ambulatory Visit (INDEPENDENT_AMBULATORY_CARE_PROVIDER_SITE_OTHER): Payer: 59 | Admitting: Family Medicine

## 2015-09-23 VITALS — BP 128/82 | HR 119 | Temp 98.3°F | Resp 18 | Ht 62.0 in | Wt 140.4 lb

## 2015-09-23 DIAGNOSIS — J029 Acute pharyngitis, unspecified: Secondary | ICD-10-CM

## 2015-09-23 DIAGNOSIS — J040 Acute laryngitis: Secondary | ICD-10-CM

## 2015-09-23 LAB — POCT RAPID STREP A (OFFICE): RAPID STREP A SCREEN: NEGATIVE

## 2015-09-23 MED ORDER — MAGIC MOUTHWASH W/LIDOCAINE: 5.0000 mL | Freq: Four times a day (QID) | ORAL | 0 refills | Status: DC | PRN

## 2015-09-23 NOTE — Patient Instructions (Addendum)
IF you received an x-ray today, you will receive an invoice from [facility]. Please contact [facility] at [phone] with questions or concerns regarding your invoice.   IF you received labwork today, you will receive an invoice from [employer]. Please contact [facility] at [phone] with questions or concerns regarding your invoice.   Our billing staff will not be able to assist you with questions regarding bills from these companies.  You will be contacted with the lab results as soon as they are available. The fastest way to get your results is to activate your My Chart account. Instructions are located on the last page of this paperwork. If you have not heard from [place] regarding the results in 2 weeks, please contact this office.    Your strep test was negative, I will send a throat culture, but currently this appears to be due to a virus.   Over-the-counter cough drops such as Cepacol, Tylenol or Motrin may help with the pain. See other information below. I also prescribed Magic mouthwash as needed.   Additionally with laryngitis, drinking plenty of fluids, and voice rest as much as possible. If you have any worsening symptoms in the next 3-4 days, or not improving into next week, can recheck to look into other causes.   Laryngitis Laryngitis is inflammation of your vocal cords. This causes hoarseness, coughing, loss of voice, sore throat, or a dry throat. Your vocal cords are two bands of muscles that are found in your throat. When you speak, these cords come together and vibrate. These vibrations come out through your mouth as sound. When your vocal cords are inflamed, your voice sounds different. Laryngitis can be temporary (acute) or long-term (chronic). Most cases of acute laryngitis improve with time. Chronic laryngitis is laryngitis that lasts for more than three weeks. CAUSES Acute laryngitis may be caused by:  A viral  infection.  Lots of talking, yelling, or singing. This is also called vocal strain.  Bacterial infections. Chronic laryngitis may be caused by:  Vocal strain.  Injury to your vocal cords.  Acid reflux (gastroesophageal reflux disease or GERD).  Allergies.  Sinus infection.  Smoking.  Alcohol abuse.  Breathing in chemicals or dust.  Growths on the vocal cords. RISK FACTORS Risk factors for laryngitis include:  Smoking.  Alcohol abuse.  Having allergies. SIGNS AND SYMPTOMS Symptoms of laryngitis may include:  Low, hoarse voice.  Loss of voice.  Dry cough.  Sore throat.  Stuffy nose. DIAGNOSIS Laryngitis may be diagnosed by:  Physical exam.  Throat culture.  Blood test.  Laryngoscopy. This procedure allows your health care provider to look at your vocal cords with a mirror or viewing tube. TREATMENT Treatment for laryngitis depends on what is causing it. Usually, treatment involves resting your voice and using medicines to soothe your throat. However, if your laryngitis is caused by a bacterial infection, you may need to take antibiotic medicine. If your laryngitis is caused by a growth, you may need to have a procedure to remove it. HOME CARE INSTRUCTIONS  Drink enough fluid to keep your urine clear or pale yellow.  Breathe in moist air. Use a humidifier if you live in a dry climate.  Take medicines only as directed by your health care provider.  If you were prescribed an antibiotic medicine, finish it all even if you start to feel better.  Do not smoke cigarettes or electronic cigarettes. If you need help quitting, ask your health care provider.  Talk  as little as possible. Also avoid whispering, which can cause vocal strain.  Write instead of talking. Do this until your voice is back to normal. SEEK MEDICAL CARE IF:  You have a fever.  You have increasing pain.  You have difficulty swallowing. SEEK IMMEDIATE MEDICAL CARE IF:  You cough  up blood.  You have trouble breathing.   This information is not intended to replace advice given to you by your health care provider. Make sure you discuss any questions you have with your health care provider.   Document Released: 01/07/2005 Document Revised: 01/28/2014 Document Reviewed: 06/22/2013 Elsevier Interactive Patient Education 2016 Elsevier Inc.   Sore Throat A sore throat is pain, burning, irritation, or scratchiness of the throat. There is often pain or tenderness when swallowing or talking. A sore throat may be accompanied by other symptoms, such as coughing, sneezing, fever, and swollen neck glands. A sore throat is often the first sign of another sickness, such as a cold, flu, strep throat, or mononucleosis (commonly known as mono). Most sore throats go away without medical treatment. CAUSES  The most common causes of a sore throat include:  A viral infection, such as a cold, flu, or mono.  A bacterial infection, such as strep throat, tonsillitis, or whooping cough.  Seasonal allergies.  Dryness in the air.  Irritants, such as smoke or pollution.  Gastroesophageal reflux disease (GERD). HOME CARE INSTRUCTIONS   Only take over-the-counter medicines as directed by your caregiver.  Drink enough fluids to keep your urine clear or pale yellow.  Rest as needed.  Try using throat sprays, lozenges, or sucking on hard candy to ease any pain (if older than 4 years or as directed).  Sip warm liquids, such as broth, herbal tea, or warm water with honey to relieve pain temporarily. You may also eat or drink cold or frozen liquids such as frozen ice pops.  Gargle with salt water (mix 1 tsp salt with 8 oz of water).  Do not smoke and avoid secondhand smoke.  Put a cool-mist humidifier in your bedroom at night to moisten the air. You can also turn on a hot shower and sit in the bathroom with the door closed for 5-10 minutes. SEEK IMMEDIATE MEDICAL CARE IF:  You have  difficulty breathing.  You are unable to swallow fluids, soft foods, or your saliva.  You have increased swelling in the throat.  Your sore throat does not get better in 7 days.  You have nausea and vomiting.  You have a fever or persistent symptoms for more than 2-3 days.  You have a fever and your symptoms suddenly get worse. MAKE SURE YOU:   Understand these instructions.  Will watch your condition.  Will get help right away if you are not doing well or get worse.   This information is not intended to replace advice given to you by your health care provider. Make sure you discuss any questions you have with your health care provider.   Document Released: 02/15/2004 Document Revised: 01/28/2014 Document Reviewed: 09/15/2011 Elsevier Interactive Patient Education [employer].

## 2015-09-23 NOTE — Progress Notes (Signed)
By signing my name below I, Tereasa Coop, attest that this documentation has been prepared under the direction and in the presence of Wendie Agreste, MD. Electonically Signed. Tereasa Coop, Scribe 09/23/2015 at 9:05 AM   Subjective:    Patient ID: Rose Campbell, female    DOB: Mar 16, 1985, 30 y.o.   MRN: [id number]  Chief Complaint  Patient presents with   Sore Throat    since Tuesday, hurts to swallow    HPI Elowen Danton is a 30 y.o. female who presents to the [facility] complaining of sore throat for the past 5 days. Sore throat worsened for the first 2 days and has been constant since. Pt reports feeling warm at initial onset of symptoms and denies fever now. Pt denies any runny nose, sinus pressure, cough, rash, blisters on hands or feet, or swollen lymph nodes. Pt also reports that she lost her voice 2 days ago. Sore throat pain is worsened by talking. Pt also reports painful swallowing.   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 No meds   Past Surgical History:  Procedure Laterality Date   TOTAL HIP ARTHROPLASTY Left 08/03/2015   Procedure: LEFT TOTAL HIP ARTHROPLASTY;  Surgeon: Garald Balding, MD;  Location: [place];  Service: Orthopedics;  Laterality: Left;   Allergies  Allergen Reactions   Latex Hives   Prior to Admission medications   Medication Sig Start Date End Date Taking? Authorizing Provider  DULoxetine  (CYMBALTA) 60 MG capsule Take 1 capsule (60 mg total) by mouth 2 (two) times daily. 06/08/15  Yes Wardell Honour, MD  folic acid (FOLVITE) 1 MG tablet Take 1 mg by mouth daily.   Yes Historical Provider, MD  Norgestim-Eth Radene Journey Triphasic (ORTHO TRI-CYCLEN LO PO) Take 1 tablet by mouth daily.    Yes Historical Provider, MD  methocarbamol (ROBAXIN) 500 MG tablet Take 1 tablet (500 mg total) by mouth every 8 (eight) hours as needed for muscle spasms. Patient not taking: Reported on 09/23/2015 08/04/15   Mike Craze Petrarca, PA-C  oxyCODONE (OXY IR/ROXICODONE) 5 MG immediate release tablet Take 1-2 tablets (5-10 mg total) by mouth every 4 (four) hours as needed for breakthrough pain. Patient not taking: Reported on 09/23/2015 08/04/15   Cherylann Ratel, PA-C  rivaroxaban (XARELTO) 10 MG TABS tablet Take 1 tablet (10 mg total) by mouth daily with breakfast. Patient not taking: Reported on 09/23/2015 08/04/15   Cherylann Ratel, PA-C   Social History   Social History   Marital status: Single    Spouse name: N/A   Number of children: N/A   Years of education: N/A   Occupational History   Not on file.   Social History Main Topics   Smoking status: Never Smoker   Smokeless tobacco: Not on file   Alcohol use No   Drug use: No  Sexual activity: Yes    Birth control/ protection: Pill   Other Topics Concern   Not on file   Social History Narrative   No narrative on file      Review of Systems  Constitutional: Negative for fever.  HENT: Positive for sore throat and trouble swallowing. Negative for congestion.   Respiratory: Negative for cough.   Skin: Negative for rash.  Hematological: Negative for adenopathy.       Objective:   Physical Exam  Constitutional: She is oriented to person, place, and time. She appears well-developed and well-nourished. No distress.  HENT:  Head: Normocephalic and atraumatic.  Right Ear: Hearing, tympanic membrane, external ear and ear canal normal.   Left Ear: Hearing, tympanic membrane, external ear and ear canal normal.  Nose: Nose normal.  Mouth/Throat: Oropharynx is clear and moist. No oropharyngeal exudate.  Two blisters on left upper pharynx/soft palate medial to the tonsillar recess. No other blisters appreciated in mouth. Lips unaffected. No exudate on tonsils.   Eyes: Conjunctivae and EOM are normal. Pupils are equal, round, and reactive to light.  Cardiovascular: Regular rhythm, normal heart sounds and intact distal pulses.  Tachycardia present.   No murmur heard. Pulmonary/Chest: Effort normal and breath sounds normal. No respiratory distress. She has no wheezes. She has no rhonchi.  Lymphadenopathy:  Tender left AC node, minimally enlarged.   Neurological: She is alert and oriented to person, place, and time.  Skin: Skin is warm and dry. No rash noted.  Psychiatric: She has a normal mood and affect. Her behavior is normal.  Vitals reviewed.    Vitals:   09/23/15 0812  BP: 128/82  Pulse: (!) 119  Resp: 18  Temp: 98.3 F (36.8 C)  TempSrc: Oral  SpO2: 98%  Weight: 140 lb 6 oz (63.7 kg)  Height: 5\' 2"  (1.575 m)   Results for orders placed or performed in visit on 09/23/15  POCT rapid strep A  Result Value Ref Range   Rapid Strep A Screen Negative Negative        Assessment & Plan:    Oletha Emshoff is a 30 y.o. female Sore throat - Plan: POCT rapid strep A, Culture, Group A Strep, magic mouthwash w/lidocaine SOLN  Laryngitis   Suspected viral illness, with one vesicular area medial to the left tonsillar recess, possible coxsackievirus. Will check throat culture, but symptomatic treatment discussed, magic mouthwash printed,  RTC precautions discussed.   Meds ordered this encounter  Medications   magic mouthwash w/lidocaine SOLN    Sig: Take 5 mLs by mouth 4 (four) times daily as needed for mouth pain.    Dispense:  120 mL    Refill:  0    Ok to substitute ingredients per pharmacy usual "magic  mouthwash" prep.   Patient Instructions    IF you received an x-ray today, you will receive an invoice from [facility]. Please contact [facility] at [phone] with questions or concerns regarding your invoice.   IF you received labwork today, you will receive an invoice from [employer]. Please contact [facility] at [phone] with questions or concerns regarding your invoice.   Our billing staff will not be able to assist you with questions regarding bills from these companies.  You will be contacted with the lab results as soon as they are available. The fastest way to get your results is to activate your My Chart account. Instructions are located on the last page of this paperwork. If you have  not heard from [place] regarding the results in 2 weeks, please contact this office.    Your strep test was negative, I will send a throat culture, but currently this appears to be due to a virus.   Over-the-counter cough drops such as Cepacol, Tylenol or Motrin may help with the pain. See other information below. I also prescribed Magic mouthwash as needed.   Additionally with laryngitis, drinking plenty of fluids, and voice rest as much as possible. If you have any worsening symptoms in the next 3-4 days, or not improving into next week, can recheck to look into other causes.   Laryngitis Laryngitis is inflammation of your vocal cords. This causes hoarseness, coughing, loss of voice, sore throat, or a dry throat. Your vocal cords are two bands of muscles that are found in your throat. When you speak, these cords come together and vibrate. These vibrations come out through your mouth as sound. When your vocal cords are inflamed, your voice sounds different. Laryngitis can be temporary (acute) or long-term (chronic). Most cases of acute laryngitis improve with time. Chronic laryngitis is laryngitis that lasts for more than three weeks. CAUSES Acute  laryngitis may be caused by:  A viral infection.  Lots of talking, yelling, or singing. This is also called vocal strain.  Bacterial infections. Chronic laryngitis may be caused by:  Vocal strain.  Injury to your vocal cords.  Acid reflux (gastroesophageal reflux disease or GERD).  Allergies.  Sinus infection.  Smoking.  Alcohol abuse.  Breathing in chemicals or dust.  Growths on the vocal cords. RISK FACTORS Risk factors for laryngitis include:  Smoking.  Alcohol abuse.  Having allergies. SIGNS AND SYMPTOMS Symptoms of laryngitis may include:  Low, hoarse voice.  Loss of voice.  Dry cough.  Sore throat.  Stuffy nose. DIAGNOSIS Laryngitis may be diagnosed by:  Physical exam.  Throat culture.  Blood test.  Laryngoscopy. This procedure allows your health care provider to look at your vocal cords with a mirror or viewing tube. TREATMENT Treatment for laryngitis depends on what is causing it. Usually, treatment involves resting your voice and using medicines to soothe your throat. However, if your laryngitis is caused by a bacterial infection, you may need to take antibiotic medicine. If your laryngitis is caused by a growth, you may need to have a procedure to remove it. HOME CARE INSTRUCTIONS  Drink enough fluid to keep your urine clear or pale yellow.  Breathe in moist air. Use a humidifier if you live in a dry climate.  Take medicines only as directed by your health care provider.  If you were prescribed an antibiotic medicine, finish it all even if you start to feel better.  Do not smoke cigarettes or electronic cigarettes. If you need help quitting, ask your health care provider.  Talk as little as possible. Also avoid whispering, which can cause vocal strain.  Write instead of talking. Do this until your voice is back to normal. SEEK MEDICAL CARE IF:  You have a fever.  You have increasing pain.  You have difficulty swallowing. SEEK  IMMEDIATE MEDICAL CARE IF:  You cough up blood.  You have trouble breathing.   This information is not intended to replace advice given to you by your health care provider. Make sure you discuss any questions you have with your health care provider.   Document Released: 01/07/2005 Document Revised: 01/28/2014 Document Reviewed: 06/22/2013 Elsevier Interactive Patient Education 2016 Elsevier Inc.   Sore Throat A sore throat  is pain, burning, irritation, or scratchiness of the throat. There is often pain or tenderness when swallowing or talking. A sore throat may be accompanied by other symptoms, such as coughing, sneezing, fever, and swollen neck glands. A sore throat is often the first sign of another sickness, such as a cold, flu, strep throat, or mononucleosis (commonly known as mono). Most sore throats go away without medical treatment. CAUSES  The most common causes of a sore throat include:  A viral infection, such as a cold, flu, or mono.  A bacterial infection, such as strep throat, tonsillitis, or whooping cough.  Seasonal allergies.  Dryness in the air.  Irritants, such as smoke or pollution.  Gastroesophageal reflux disease (GERD). HOME CARE INSTRUCTIONS   Only take over-the-counter medicines as directed by your caregiver.  Drink enough fluids to keep your urine clear or pale yellow.  Rest as needed.  Try using throat sprays, lozenges, or sucking on hard candy to ease any pain (if older than 4 years or as directed).  Sip warm liquids, such as broth, herbal tea, or warm water with honey to relieve pain temporarily. You may also eat or drink cold or frozen liquids such as frozen ice pops.  Gargle with salt water (mix 1 tsp salt with 8 oz of water).  Do not smoke and avoid secondhand smoke.  Put a cool-mist humidifier in your bedroom at night to moisten the air. You can also turn on a hot shower and sit in the bathroom with the door closed for 5-10 minutes. SEEK  IMMEDIATE MEDICAL CARE IF:  You have difficulty breathing.  You are unable to swallow fluids, soft foods, or your saliva.  You have increased swelling in the throat.  Your sore throat does not get better in 7 days.  You have nausea and vomiting.  You have a fever or persistent symptoms for more than 2-3 days.  You have a fever and your symptoms suddenly get worse. MAKE SURE YOU:   Understand these instructions.  Will watch your condition.  Will get help right away if you are not doing well or get worse.   This information is not intended to replace advice given to you by your health care provider. Make sure you discuss any questions you have with your health care provider.   Document Released: 02/15/2004 Document Revised: 01/28/2014 Document Reviewed: 09/15/2011 Elsevier Interactive Patient Education [employer].

## 2015-09-24 LAB — CULTURE, GROUP A STREP: ORGANISM ID, BACTERIA: NORMAL

## 2015-10-11 ENCOUNTER — Other Ambulatory Visit: Payer: Self-pay | Admitting: Family Medicine

## 2015-10-11 ENCOUNTER — Other Ambulatory Visit: Payer: Self-pay | Admitting: Orthopaedic Surgery

## 2015-10-11 DIAGNOSIS — F32A Depression, unspecified: Secondary | ICD-10-CM

## 2015-10-11 DIAGNOSIS — F329 Major depressive disorder, single episode, unspecified: Secondary | ICD-10-CM

## 2015-10-11 DIAGNOSIS — M1611 Unilateral primary osteoarthritis, right hip: Secondary | ICD-10-CM

## 2015-10-12 ENCOUNTER — Encounter (INDEPENDENT_AMBULATORY_CARE_PROVIDER_SITE_OTHER): Payer: Self-pay

## 2015-10-20 ENCOUNTER — Ambulatory Visit
Admission: RE | Admit: 2015-10-20 | Discharge: 2015-10-20 | Disposition: A | Discharge status: Home / Self Care | Payer: 59 | Source: Ambulatory Visit | Attending: Orthopaedic Surgery | Admitting: Orthopaedic Surgery

## 2015-10-20 DIAGNOSIS — M1611 Unilateral primary osteoarthritis, right hip: Secondary | ICD-10-CM

## 2015-10-20 MED ORDER — IOPAMIDOL (ISOVUE-M 200) INJECTION 41%
1.0000 mL | Freq: Once | INTRAMUSCULAR | Status: AC
  Administered 2015-10-20: 1 mL via INTRA_ARTICULAR

## 2015-10-20 MED ORDER — METHYLPREDNISOLONE ACETATE 40 MG/ML INJ SUSP (RADIOLOG
120.0000 mg | Freq: Once | INTRAMUSCULAR | Status: AC
  Administered 2015-10-20: 120 mg via INTRA_ARTICULAR

## 2015-11-06 ENCOUNTER — Ambulatory Visit (INDEPENDENT_AMBULATORY_CARE_PROVIDER_SITE_OTHER): Payer: 59 | Admitting: Orthopaedic Surgery

## 2015-11-06 DIAGNOSIS — M25561 Pain in right knee: Secondary | ICD-10-CM | POA: Diagnosis not present

## 2015-11-06 DIAGNOSIS — M25551 Pain in right hip: Secondary | ICD-10-CM

## 2015-11-06 DIAGNOSIS — M1612 Unilateral primary osteoarthritis, left hip: Secondary | ICD-10-CM | POA: Diagnosis not present

## 2015-11-06 DIAGNOSIS — M25552 Pain in left hip: Secondary | ICD-10-CM | POA: Diagnosis not present

## 2015-11-17 ENCOUNTER — Telehealth (INDEPENDENT_AMBULATORY_CARE_PROVIDER_SITE_OTHER): Payer: Self-pay | Admitting: Orthopaedic Surgery

## 2015-11-17 NOTE — Telephone Encounter (Signed)
Per voice mail from Harrel Carina, PA-C at [facility], [place] surgery should be put on hold.  Patient has had recent labs showing increased inflammation and patient is not taking meds as prescribed.  Please call to discuss.

## 2015-11-17 NOTE — Telephone Encounter (Signed)
Please see note below. 

## 2015-11-20 ENCOUNTER — Telehealth (INDEPENDENT_AMBULATORY_CARE_PROVIDER_SITE_OTHER): Payer: Self-pay | Admitting: Orthopaedic Surgery

## 2015-11-20 NOTE — Telephone Encounter (Signed)
Marella Chimes at [facility] called about abnormal labs on patient. She knows patient is scheduled for surgery and would like to speak to someone about the lab work before surgery. Please Advise

## 2015-11-20 NOTE — Telephone Encounter (Signed)
Patient called and would like to talk about upcoming surgery. Patient would like to proceed with surgery, even though [facility] Rheum would like to wait based on lab work, per patient. Patient states she had a fall last week and needs the surgery due to the pain she is in. Please call patient to discuss.   Patient is also requesting refill of oxycodone.

## 2015-11-20 NOTE — Telephone Encounter (Signed)
Please see note below. There are multiple fax encounters sent to PW/[facility] regarding pt.

## 2015-11-21 ENCOUNTER — Other Ambulatory Visit: Payer: Self-pay | Admitting: Emergency Medicine

## 2015-11-21 ENCOUNTER — Other Ambulatory Visit: Payer: Self-pay | Admitting: Family Medicine

## 2015-11-21 ENCOUNTER — Encounter: Payer: Self-pay | Admitting: Family Medicine

## 2015-11-21 DIAGNOSIS — F329 Major depressive disorder, single episode, unspecified: Secondary | ICD-10-CM

## 2015-11-21 DIAGNOSIS — F32A Depression, unspecified: Secondary | ICD-10-CM

## 2015-11-21 MED ORDER — DULOXETINE HCL 60 MG PO CPEP: 60.0000 mg | ORAL_CAPSULE | Freq: Two times a day (BID) | ORAL | 1 refills | Status: DC

## 2015-11-21 NOTE — Telephone Encounter (Signed)
Received refill request for DULoxetine CAP from [facility].  Last office visit was 11/09/2014 and Last refill was 06/08/15. Is it ok to refill since last office visit was over a year. Please advise. Thanks

## 2015-11-21 NOTE — Telephone Encounter (Signed)
Please advise 

## 2015-11-24 ENCOUNTER — Telehealth (INDEPENDENT_AMBULATORY_CARE_PROVIDER_SITE_OTHER): Payer: Self-pay | Admitting: Orthopaedic Surgery

## 2015-11-24 MED ORDER — OXYCODONE-ACETAMINOPHEN 5-325 MG PO TABS: 1.0000 | ORAL_TABLET | Freq: Two times a day (BID) | ORAL | 0 refills | Status: DC | PRN

## 2015-11-24 NOTE — Telephone Encounter (Signed)
rx at your desk...please call her...thanks

## 2015-11-24 NOTE — Telephone Encounter (Signed)
Patient called Monday 10/30 for refill of Oxycodone; patient is in pain and out of medication.

## 2015-11-24 NOTE — Telephone Encounter (Signed)
rx printed

## 2015-11-25 ENCOUNTER — Other Ambulatory Visit: Payer: Self-pay | Admitting: Family Medicine

## 2015-11-25 DIAGNOSIS — F32A Depression, unspecified: Secondary | ICD-10-CM

## 2015-11-25 DIAGNOSIS — F329 Major depressive disorder, single episode, unspecified: Secondary | ICD-10-CM

## 2015-11-27 NOTE — Telephone Encounter (Signed)
Left VM for pt to pick up prescription at [facility].

## 2015-11-30 ENCOUNTER — Telehealth (INDEPENDENT_AMBULATORY_CARE_PROVIDER_SITE_OTHER): Payer: Self-pay | Admitting: Orthopaedic Surgery

## 2015-11-30 NOTE — Telephone Encounter (Signed)
Patient's sister picked up rx for patient on Monday and it wasn't signed. Her sister will bring the rx back this afternoon. Will you please print another one.

## 2015-12-04 ENCOUNTER — Telehealth (INDEPENDENT_AMBULATORY_CARE_PROVIDER_SITE_OTHER): Payer: Self-pay | Admitting: Orthopaedic Surgery

## 2015-12-04 NOTE — Telephone Encounter (Signed)
SPOKE TO ERIN GRAY, PA-C AND HER PLATELETS ARE [phone]   CALLED [facility] AND TOLD HER CONTACT HER RHEUMATOLOGIST because with as high as her platelets are where afraid of a coagulopathy. She agreed with that.

## 2015-12-04 NOTE — Telephone Encounter (Signed)
Patient states she just spoke with Aaron Edelman and then called her rheumatologist. She was calling back with their response. Please call patient.

## 2015-12-05 NOTE — Telephone Encounter (Signed)
Call return

## 2015-12-06 ENCOUNTER — Telehealth (INDEPENDENT_AMBULATORY_CARE_PROVIDER_SITE_OTHER): Payer: Self-pay | Admitting: Orthopaedic Surgery

## 2015-12-06 NOTE — Telephone Encounter (Signed)
Patient needs her last office notes sent to [employer].

## 2015-12-08 ENCOUNTER — Telehealth (INDEPENDENT_AMBULATORY_CARE_PROVIDER_SITE_OTHER): Payer: Self-pay | Admitting: Orthopaedic Surgery

## 2015-12-08 NOTE — Telephone Encounter (Signed)
Called pt per Dr. Durward Fortes. She can take 500mg -2 3xday.

## 2015-12-08 NOTE — Telephone Encounter (Signed)
Patient is experiencing really bad pain and aches and states it is affecting her ability to walk and move around. Patient is requesting a phone call today.

## 2015-12-09 ENCOUNTER — Encounter (HOSPITAL_COMMUNITY): Payer: Self-pay | Admitting: *Deleted

## 2015-12-09 ENCOUNTER — Emergency Department (HOSPITAL_COMMUNITY)
Admission: EM | Admit: 2015-12-09 | Discharge: 2015-12-09 | Disposition: A | Discharge status: Home / Self Care | Payer: 59 | Attending: Emergency Medicine | Admitting: Emergency Medicine

## 2015-12-09 DIAGNOSIS — M329 Systemic lupus erythematosus, unspecified: Secondary | ICD-10-CM

## 2015-12-09 DIAGNOSIS — Z96642 Presence of left artificial hip joint: Secondary | ICD-10-CM | POA: Insufficient documentation

## 2015-12-09 DIAGNOSIS — Z91040 Latex allergy status: Secondary | ICD-10-CM | POA: Insufficient documentation

## 2015-12-09 DIAGNOSIS — M25561 Pain in right knee: Secondary | ICD-10-CM | POA: Diagnosis present

## 2015-12-09 DIAGNOSIS — R809 Proteinuria, unspecified: Secondary | ICD-10-CM

## 2015-12-09 LAB — PREGNANCY, URINE: PREG TEST UR: NEGATIVE

## 2015-12-09 LAB — BASIC METABOLIC PANEL
Anion gap: 12 (ref 5–15)
BUN: 5 mg/dL — AB (ref 6–20)
CHLORIDE: 96 mmol/L — AB (ref 101–111)
CO2: 27 mmol/L (ref 22–32)
CREATININE: 0.56 mg/dL (ref 0.44–1.00)
Calcium: 9 mg/dL (ref 8.9–10.3)
GFR calc Af Amer: >60 mL/min (ref >60)
GFR calc non Af Amer: >60 mL/min (ref >60)
GLUCOSE: 97 mg/dL (ref 65–99)
POTASSIUM: 2.9 mmol/L — AB (ref 3.5–5.1)
SODIUM: 135 mmol/L (ref 135–145)

## 2015-12-09 LAB — URINALYSIS, ROUTINE W REFLEX MICROSCOPIC
GLUCOSE, UA: NEGATIVE mg/dL
HGB URINE DIPSTICK: NEGATIVE
Ketones, ur: 15 mg/dL — AB
Nitrite: NEGATIVE
PROTEIN: 30 mg/dL — AB
Specific Gravity, Urine: 1.018 (ref 1.005–1.030)
pH: 5.5 (ref 5.0–8.0)

## 2015-12-09 LAB — CBC WITH DIFFERENTIAL/PLATELET
Basophils Absolute: 0 10*3/uL (ref 0.0–0.1)
Basophils Relative: 0 %
EOS ABS: 0.4 10*3/uL (ref 0.0–0.7)
EOS PCT: 6 %
HCT: 33 % — ABNORMAL LOW (ref 36.0–46.0)
Hemoglobin: 10.6 g/dL — ABNORMAL LOW (ref 12.0–15.0)
LYMPHS ABS: 1.4 10*3/uL (ref 0.7–4.0)
LYMPHS PCT: 19 %
MCH: 26.2 pg (ref 26.0–34.0)
MCHC: 32.1 g/dL (ref 30.0–36.0)
MCV: 81.7 fL (ref 78.0–100.0)
MONO ABS: 0.9 10*3/uL (ref 0.1–1.0)
MONOS PCT: 13 %
Neutro Abs: 4.4 10*3/uL (ref 1.7–7.7)
Neutrophils Relative %: 62 %
PLATELETS: 749 10*3/uL — AB (ref 150–400)
RBC: 4.04 MIL/uL (ref 3.87–5.11)
RDW: 16.8 % — AB (ref 11.5–15.5)
WBC: 7.1 10*3/uL (ref 4.0–10.5)

## 2015-12-09 LAB — URINE MICROSCOPIC-ADD ON: RBC / HPF: NONE SEEN RBC/hpf (ref 0–5)

## 2015-12-09 MED ORDER — OXYCODONE-ACETAMINOPHEN 5-325 MG PO TABS
2.0000 | ORAL_TABLET | Freq: Once | ORAL | Status: AC
  Administered 2015-12-09: 2 via ORAL
  Filled 2015-12-09: qty 2

## 2015-12-09 MED ORDER — POTASSIUM CHLORIDE CRYS ER 20 MEQ PO TBCR
40.0000 meq | EXTENDED_RELEASE_TABLET | Freq: Once | ORAL | Status: AC
  Administered 2015-12-09: 40 meq via ORAL
  Filled 2015-12-09: qty 2

## 2015-12-09 MED ORDER — MELOXICAM 7.5 MG PO TABS: 7.5000 mg | ORAL_TABLET | Freq: Every day | ORAL | 0 refills | Status: DC

## 2015-12-09 MED ORDER — PREDNISONE 20 MG PO TABS
40.0000 mg | ORAL_TABLET | Freq: Once | ORAL | Status: AC
  Administered 2015-12-09: 40 mg via ORAL
  Filled 2015-12-09: qty 2

## 2015-12-09 MED ORDER — OXYCODONE-ACETAMINOPHEN 5-325 MG PO TABS: 1.0000 | ORAL_TABLET | ORAL | 0 refills | Status: DC | PRN

## 2015-12-09 MED ORDER — KETOROLAC TROMETHAMINE 60 MG/2ML IM SOLN
60.0000 mg | Freq: Once | INTRAMUSCULAR | Status: AC
  Administered 2015-12-09: 60 mg via INTRAMUSCULAR
  Filled 2015-12-09: qty 2

## 2015-12-09 MED ORDER — PREDNISONE 20 MG PO TABS: 40.0000 mg | ORAL_TABLET | Freq: Every day | ORAL | 0 refills | Status: AC

## 2015-12-09 NOTE — ED Provider Notes (Signed)
[facility] DEPT Provider Note   CSN: [id number] Arrival date & time: 12/09/15  1027     History   Chief Complaint Chief Complaint  Patient presents with  . Joint Pain    HPI Rose Campbell is a 30 y.o. female.  HPI  Patient with hx lupus, polyarthralgia, off of her Humira and Methotrexate x 5 months, presents with diffuse joint pain, worst in her bilateral ankles and knees.  States she has been unable to walk due to the pain, even with a walker at home.  Also has had pain in hips, neck, wrists, and elbows.  The pain has been ongoing x 1 week.   Has need for total hip replacement on the right and is s/p left hip replacement 5 months ago.  Has been awaiting medical clearance for right hip surgery, recently fired from rheumatology practice following disagreement.  Has had decreased appetite.  Denies fevers, recent illness, recent travel, any tick bites.   Orthopedist is Dr Durward Fortes.    Past Medical History:  Diagnosis Date  . Arthritis   . Depression    takes Cymbalta daily  . Joint pain   . Joint swelling   . Lupus   . Polyarthralgia    takes Humira and Methotrexate  . Seizures ([place])    hx of-as a child. No meds    Patient Active Problem List   Diagnosis Date Noted  . Osteoarthritis of left hip 08/03/2015  . 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 Latex allergy 08/01/2015  . Weight gain due to medication 10/19/2014  . Depression with anxiety 08/06/2013  . Uses oral contraception 07/01/2012  . Lupus 08/09/2011  . Joint pain 07/23/2011    Past Surgical History:  Procedure Laterality Date  . TOTAL HIP ARTHROPLASTY Left 08/03/2015   Procedure: LEFT TOTAL HIP ARTHROPLASTY;  Surgeon: Garald Balding, MD;  Location: [facility];  Service: Orthopedics;  Laterality: Left;    OB History    No data available       Home Medications    Prior to Admission medications   Medication Sig Start Date End Date Taking? Authorizing Provider  DULoxetine (CYMBALTA) 60 MG  capsule Take 1 capsule (60 mg total) by mouth 2 (two) times daily. 11/21/15  Yes Gay Filler Copland, MD  ibuprofen (ADVIL,MOTRIN) 200 MG tablet Take 400 mg by mouth every 6 (six) hours as needed for headache or moderate pain.   Yes Historical Provider, MD  Norgestim-Eth Radene Journey Triphasic (ORTHO TRI-CYCLEN LO PO) Take 1 tablet by mouth daily.    Yes Historical Provider, MD  magic mouthwash w/lidocaine SOLN Take 5 mLs by mouth 4 (four) times daily as needed for mouth pain. Patient not taking: Reported on 12/09/2015 09/23/15   Wendie Agreste, MD  meloxicam (MOBIC) 7.5 MG tablet Take 1 tablet (7.5 mg total) by mouth daily. 12/09/15   Clayton Bibles, PA-C  oxyCODONE-acetaminophen (PERCOCET/ROXICET) 5-325 MG tablet Take 1-2 tablets by mouth every 4 (four) hours as needed for severe pain. 12/09/15   Clayton Bibles, PA-C  predniSONE (DELTASONE) 20 MG tablet Take 2 tablets (40 mg total) by mouth daily. 12/09/15 12/19/15  Clayton Bibles, PA-C    Family History History reviewed. No pertinent family history.  Social History Social History  Substance Use Topics  . Smoking status: Never Smoker  . Smokeless tobacco: Not on file  . Alcohol use No     Allergies   Latex   Review of Systems Review of Systems  All other systems reviewed  and are negative.    Physical Exam Updated Vital Signs BP 106/74   Pulse 102   Temp 98.2 F (36.8 C) (Oral)   Resp 18   SpO2 96%   Physical Exam  Constitutional: She appears well-developed and well-nourished. No distress.  HENT:  Head: Normocephalic and atraumatic.  Neck: Neck supple.  Cardiovascular: Normal rate and regular rhythm.   Pulmonary/Chest: Effort normal and breath sounds normal. No respiratory distress. She has no wheezes. She has no rales.  Abdominal: Soft. She exhibits no distension. There is no tenderness. There is no rebound and no guarding.  Musculoskeletal:  Diffuse edema and tenderness in the bilateral ankles and knees.  Symmetric.  Pt unable to  move joints secondary to pain.  No edema or tenderness of hips, shoulders, elbows or wrists.  No erythema of any joint.  No calf swelling or tenderness.  Distal pulses intact.    Neurological: She is alert.  Skin: She is not diaphoretic.  Nursing note and vitals reviewed.    ED Treatments / Results  Labs (all labs ordered are listed, but only abnormal results are displayed) Labs Reviewed  BASIC METABOLIC PANEL - Abnormal; Notable for the following:       Result Value   Potassium 2.9 (*)    Chloride 96 (*)    BUN 5 (*)    All other components within normal limits  CBC WITH DIFFERENTIAL/PLATELET - Abnormal; Notable for the following:    Hemoglobin 10.6 (*)    HCT 33.0 (*)    RDW 16.8 (*)    Platelets 749 (*)    All other components within normal limits  URINALYSIS, ROUTINE W REFLEX MICROSCOPIC (NOT AT [facility]) - Abnormal; Notable for the following:    Color, Urine AMBER (*)    APPearance HAZY (*)    Bilirubin Urine MODERATE (*)    Ketones, ur 15 (*)    Protein, ur 30 (*)    Leukocytes, UA MODERATE (*)    All other components within normal limits  URINE MICROSCOPIC-ADD ON - Abnormal; Notable for the following:    Squamous Epithelial / LPF 6-30 (*)    Bacteria, UA FEW (*)    All other components within normal limits  PREGNANCY, URINE    EKG  EKG Interpretation None       Radiology No results found.  Procedures Procedures (including critical care time)  Medications Ordered in ED Medications  oxyCODONE-acetaminophen (PERCOCET/ROXICET) 5-325 MG per tablet 2 tablet (2 tablets Oral Given 12/09/15 1212)  potassium chloride SA (K-DUR,KLOR-CON) CR tablet 40 mEq (40 mEq Oral Given 12/09/15 1318)  ketorolac (TORADOL) injection 60 mg (60 mg Intramuscular Given 12/09/15 1319)  predniSONE (DELTASONE) tablet 40 mg (40 mg Oral Given 12/09/15 1318)     Initial Impression / Assessment and Plan / ED Course  I have reviewed the triage vital signs and the nursing notes.  Pertinent  labs & imaging results that were available during my care of the patient were reviewed by me and considered in my medical decision making (see chart for details).  Clinical Course as of Dec 08 1528  Sat Dec 09, 2015  1450 Pain is improved.  Pt able to flex knees and move legs around now.  Feels comfortable with discharge home.    [EW]    Clinical Course User Index [EW] Clayton Bibles, PA-C    Afebrile, nontoxic patient with hx lupus with diffuse arthralgia.  Has been off treatment for lupus for 5 months.  Recent  disagreement with rheumatology.  Significant increase in diffuse arthralgia and swelling in bilateral knees and ankles.  No injury.  No fevers.  No erythema or warmth.  Doubt septic joint.  Suspect arthritis flare related to lupus.  Improvement with treatment in ED.  Gets chronic narcotic pain medication from orthopedics, checked on [place].  Pt has had increased requirement of narcotics which clinically seems to be appropriate.  Needs rheumatology follow up.  Labs unremarkable.  Urine with protein.  Renal function is normal.  Pt made aware and advised this needs to be rechecked within 1 week.  Pt has walker at home.  Discussed pt and plan with Dr Laverta Baltimore.  D/C home with pain medications, prednisone, rheumatology and orthopedic follow up, return precautions.  Discussed result, findings, treatment, and follow up  with patient.  Pt given return precautions.  Pt verbalizes understanding and agrees with plan.       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

## 2015-12-09 NOTE — Discharge Instructions (Signed)
Read the information below.  Use the prescribed medication as directed.  Please discuss all new medications with your pharmacist.  Do not take additional tylenol while taking the prescribed pain medication to avoid overdose.  You may return to the Emergency Department at any time for worsening condition or any new symptoms that concern you.    If you develop uncontrolled pain, weakness or numbness of the extremity, severe discoloration of the skin, or you are unable to walk or move your joints, return to the ER for a recheck.     Please have your urine and your labs rechecked in the next 1 week.  Please see rheumatology as soon as possible.    There is a rheumatologist who is part of [facility] [employer], Dr Estanislado Pandy.  If you are unable to follow up with [facility], please ask [place] if she will see you.

## 2015-12-09 NOTE — ED Triage Notes (Signed)
Pt arrived by ems for multiple complaints. Has hx of lupus and reports pain to all joints. More severe and swelling to hips and joints, difficulty ambulating.

## 2015-12-09 NOTE — ED Notes (Signed)
Pt sts she cannot walk-- this NT helped pt from wheelchair to bed, with moderate assistance.

## 2015-12-11 NOTE — Telephone Encounter (Signed)
Please be sure she makes an appt with [facility] family practice. She has an elevated platelet count that increases her surgery risk. Thanks

## 2015-12-12 NOTE — Telephone Encounter (Signed)
Patient provided the following information: Fax# [phone]  Claim # [id number]

## 2015-12-12 NOTE — Telephone Encounter (Signed)
yes

## 2015-12-12 NOTE — Telephone Encounter (Signed)
Spoke with Rose Hawking, RN at [facility] regarding pt. She has been dismissed from their facility due to multiple unscheduled visits,  sister accompanies Rose Campbell and I think police may have been called . Checking pts chart she has been to multiple ED with visits recently to [facility] and [place] [place].  Sister has also called PO wanting narcotics for Rose Campbell.  Last I spoke with Rose Campbell was 12/08/15 and I gave her instructions (see below) from PW.

## 2015-12-12 NOTE — Telephone Encounter (Signed)
There is no record of  Insurance information regarding [employer] as her insurance nor a phone number for any visit.

## 2015-12-12 NOTE — Telephone Encounter (Signed)
Sent 12/12/15 at 2:45pm to info provided

## 2015-12-13 NOTE — Telephone Encounter (Signed)
Please call her and Have her follow up at [facility] for Rheumatology and for total knee once medically stable

## 2015-12-19 ENCOUNTER — Telehealth (INDEPENDENT_AMBULATORY_CARE_PROVIDER_SITE_OTHER): Payer: Self-pay | Admitting: Orthopaedic Surgery

## 2015-12-19 NOTE — Telephone Encounter (Signed)
Explained to person answering phone to tell her she is not scheduled for surgery

## 2015-12-19 NOTE — Telephone Encounter (Signed)
Patient would like to know when she can have labs done for upcoming surgery. Please advise.

## 2015-12-20 ENCOUNTER — Telehealth (INDEPENDENT_AMBULATORY_CARE_PROVIDER_SITE_OTHER): Payer: Self-pay | Admitting: Orthopaedic Surgery

## 2015-12-20 ENCOUNTER — Telehealth: Payer: Self-pay | Admitting: Rheumatology

## 2015-12-20 NOTE — Telephone Encounter (Signed)
Patient would like Dr. Durward Fortes to fax a rheum referral to Dr. Gerilyn Nestle at [phone]

## 2015-12-20 NOTE — Telephone Encounter (Signed)
Patient would like a referral faxed to Dr. Hermelinda Medicus at [phone], phone # [phone]

## 2015-12-20 NOTE — Telephone Encounter (Signed)
Left message on machine letting patient know she can fill out a medical release form.

## 2015-12-20 NOTE — Telephone Encounter (Signed)
We have not referred to Dr Earnest Conroy. We referred her to [facility]. If she would like to pick up her records and take to Dr Earnest Conroy to self schedule she may, can you call her, make sure she has a medical records form.

## 2015-12-21 ENCOUNTER — Encounter: Payer: Self-pay | Admitting: Family Medicine

## 2015-12-21 ENCOUNTER — Other Ambulatory Visit (INDEPENDENT_AMBULATORY_CARE_PROVIDER_SITE_OTHER): Payer: Self-pay

## 2015-12-21 DIAGNOSIS — G894 Chronic pain syndrome: Secondary | ICD-10-CM

## 2015-12-21 MED ORDER — PREDNISONE 10 MG PO TABS: ORAL_TABLET | ORAL | 0 refills | Status: DC

## 2015-12-21 NOTE — Telephone Encounter (Signed)
Please advise 

## 2015-12-21 NOTE — Telephone Encounter (Signed)
Called pt to get more details- no answer.,  Will send her an email

## 2015-12-21 NOTE — Telephone Encounter (Signed)
Send referral

## 2015-12-21 NOTE — Telephone Encounter (Signed)
done

## 2015-12-21 NOTE — Addendum Note (Signed)
Addended by: Lamar Blinks C on: 12/21/2015 10:20 PM   Modules accepted: Orders

## 2015-12-28 ENCOUNTER — Telehealth (INDEPENDENT_AMBULATORY_CARE_PROVIDER_SITE_OTHER): Payer: Self-pay | Admitting: Orthopaedic Surgery

## 2015-12-28 NOTE — Telephone Encounter (Signed)
Done

## 2015-12-28 NOTE — Telephone Encounter (Signed)
Patient called about pain medication refill and about referral being sent to a rheumatologist. I advised patient that the referral had been sent to Dr. Gerilyn Nestle already.

## 2016-01-02 ENCOUNTER — Other Ambulatory Visit: Payer: Self-pay | Admitting: Family Medicine

## 2016-01-02 ENCOUNTER — Telehealth (INDEPENDENT_AMBULATORY_CARE_PROVIDER_SITE_OTHER): Payer: Self-pay

## 2016-01-02 DIAGNOSIS — F32A Depression, unspecified: Secondary | ICD-10-CM

## 2016-01-02 DIAGNOSIS — F329 Major depressive disorder, single episode, unspecified: Secondary | ICD-10-CM

## 2016-01-02 NOTE — Telephone Encounter (Signed)
Pt called into office wanting to know why she could not get pain meds.This pt has called multiple times regarding wanting narcotics. Messages have been sent to [facility] and [facility] and I left messages on her VM telling her no meds. Since she did not have a Rheumatologist (she has been banned from several), we referred her to Dr. Earnest Conroy in [place].  I explained we do not need to see her until she has clearance from her new Rheum dr for any kind of surgery.

## 2016-01-03 ENCOUNTER — Encounter: Payer: Self-pay | Admitting: Family Medicine

## 2016-01-03 ENCOUNTER — Ambulatory Visit (INDEPENDENT_AMBULATORY_CARE_PROVIDER_SITE_OTHER): Payer: 59 | Admitting: Family Medicine

## 2016-01-03 ENCOUNTER — Other Ambulatory Visit: Payer: Self-pay | Admitting: Emergency Medicine

## 2016-01-03 VITALS — BP 133/82 | HR 101 | Temp 97.7°F | Ht 62.0 in | Wt 141.6 lb

## 2016-01-03 DIAGNOSIS — M329 Systemic lupus erythematosus, unspecified: Secondary | ICD-10-CM

## 2016-01-03 DIAGNOSIS — E876 Hypokalemia: Secondary | ICD-10-CM | POA: Diagnosis not present

## 2016-01-03 DIAGNOSIS — D473 Essential (hemorrhagic) thrombocythemia: Secondary | ICD-10-CM

## 2016-01-03 DIAGNOSIS — M167 Other unilateral secondary osteoarthritis of hip: Secondary | ICD-10-CM | POA: Diagnosis not present

## 2016-01-03 DIAGNOSIS — F32A Depression, unspecified: Secondary | ICD-10-CM

## 2016-01-03 DIAGNOSIS — M359 Systemic involvement of connective tissue, unspecified: Secondary | ICD-10-CM | POA: Diagnosis not present

## 2016-01-03 DIAGNOSIS — D8989 Other specified disorders involving the immune mechanism, not elsewhere classified: Secondary | ICD-10-CM

## 2016-01-03 DIAGNOSIS — D75839 Thrombocytosis, unspecified: Secondary | ICD-10-CM

## 2016-01-03 DIAGNOSIS — R3915 Urgency of urination: Secondary | ICD-10-CM

## 2016-01-03 DIAGNOSIS — F329 Major depressive disorder, single episode, unspecified: Secondary | ICD-10-CM

## 2016-01-03 LAB — COMPREHENSIVE METABOLIC PANEL
ALBUMIN: 3.8 g/dL (ref 3.5–5.2)
ALK PHOS: 83 U/L (ref 39–117)
ALT: 14 U/L (ref 0–35)
AST: 10 U/L (ref 0–37)
BILIRUBIN TOTAL: 0.4 mg/dL (ref 0.2–1.2)
BUN: 17 mg/dL (ref 6–23)
CALCIUM: 8.9 mg/dL (ref 8.4–10.5)
CO2: 28 mEq/L (ref 19–32)
CREATININE: 0.54 mg/dL (ref 0.40–1.20)
Chloride: 104 mEq/L (ref 96–112)
GFR: 171.37 mL/min (ref >60.00)
Glucose, Bld: 84 mg/dL (ref 70–99)
Potassium: 4 mEq/L (ref 3.5–5.1)
Sodium: 140 mEq/L (ref 135–145)
TOTAL PROTEIN: 7.6 g/dL (ref 6.0–8.3)

## 2016-01-03 LAB — CBC
HCT: 35.9 % — ABNORMAL LOW (ref 36.0–46.0)
Hemoglobin: 11.4 g/dL — ABNORMAL LOW (ref 12.0–15.0)
MCHC: 31.6 g/dL (ref 30.0–36.0)
MCV: 83.7 fl (ref 78.0–100.0)
PLATELETS: 437 10*3/uL — AB (ref 150.0–400.0)
RBC: 4.28 Mil/uL (ref 3.87–5.11)
RDW: 21.2 % — ABNORMAL HIGH (ref 11.5–15.5)
WBC: 13.3 10*3/uL — AB (ref 4.0–10.5)

## 2016-01-03 MED ORDER — DULOXETINE HCL 60 MG PO CPEP: 60.0000 mg | ORAL_CAPSULE | Freq: Two times a day (BID) | ORAL | 1 refills | Status: DC

## 2016-01-03 MED ORDER — PREDNISONE 10 MG PO TABS: ORAL_TABLET | ORAL | 0 refills | Status: DC

## 2016-01-03 MED ORDER — HYDROCODONE-ACETAMINOPHEN 5-325 MG PO TABS: 1.0000 | ORAL_TABLET | Freq: Two times a day (BID) | ORAL | 0 refills | Status: DC | PRN

## 2016-01-03 NOTE — Progress Notes (Signed)
Pre visit review using our clinic review tool, if applicable. No additional management support is needed unless otherwise documented below in the visit note. 

## 2016-01-03 NOTE — Progress Notes (Signed)
[place] [facility] [address], [place], [place] [address] [phone]  Date:  01/03/2016   Name:  Rose Campbell   DOB:  1985/04/05   MRN:  [id number]  PCP:  Pcp Not In System    Chief Complaint: Establish Care (Pt here to est care. )   History of Present Illness:  Rose Campbell is a 30 y.o. very pleasant female patient who presents with the following:  Here today as a new patient although I know this pt from [facility].   She has a history of joint disorder- She is thought to have lupus and ankylosing spondylitis  She had a left hip replacement in July- she had to stop her Humira and methotrexate prior to her operation.  Her left hip has done well, and they had planned to replace her right hip in November.   She followed back up with rheumatology prior to her operation.  Pt relates that she was cleared for surgery, but 2 days later was told that she could not have the operation as her platelets were very high/ K low.  She got upset and ended up being fired by her rheumatologist She has an appt with her new rheum, Dr. Gerilyn Nestle at [facility] rheum in early February  She was noted to have low K on most recent labs.  Admission on 12/09/2015, Discharged on 12/09/2015  Component Date Value Ref Range Status  . Sodium 12/09/2015 135  135 - 145 mmol/L Final  . Potassium 12/09/2015 2.9* 3.5 - 5.1 mmol/L Final  . Chloride 12/09/2015 96* 101 - 111 mmol/L Final  . CO2 12/09/2015 27  22 - 32 mmol/L Final  . Glucose, Bld 12/09/2015 97  65 - 99 mg/dL Final  . BUN 12/09/2015 5* 6 - 20 mg/dL Final  . Creatinine, Ser 12/09/2015 0.56  0.44 - 1.00 mg/dL Final  . Calcium 12/09/2015 9.0  8.9 - 10.3 mg/dL Final  . GFR calc non Af Amer 12/09/2015 >60  >60 mL/min Final  . GFR calc Af Amer 12/09/2015 >60  >60 mL/min Final   Comment: (NOTE) The eGFR has been calculated using the CKD EPI equation. This calculation has not been validated in all  clinical situations. eGFR's persistently <60 mL/min signify possible Chronic Kidney Disease.   . Anion gap 12/09/2015 12  5 - 15 Final  . WBC 12/09/2015 7.1  4.0 - 10.5 K/uL Final  . RBC 12/09/2015 4.04  3.87 - 5.11 MIL/uL Final  . Hemoglobin 12/09/2015 10.6* 12.0 - 15.0 g/dL Final  . HCT 12/09/2015 33.0* 36.0 - 46.0 % Final  . MCV 12/09/2015 81.7  78.0 - 100.0 fL Final  . MCH 12/09/2015 26.2  26.0 - 34.0 pg Final  . MCHC 12/09/2015 32.1  30.0 - 36.0 g/dL Final  . RDW 12/09/2015 16.8* 11.5 - 15.5 % Final  . Platelets 12/09/2015 749* 150 - 400 K/uL Final  . Neutrophils Relative % 12/09/2015 62  % Final  . Neutro Abs 12/09/2015 4.4  1.7 - 7.7 K/uL Final  . Lymphocytes Relative 12/09/2015 19  % Final  . Lymphs Abs 12/09/2015 1.4  0.7 - 4.0 K/uL Final  . Monocytes Relative 12/09/2015 13  % Final  . Monocytes Absolute 12/09/2015 0.9  0.1 - 1.0 K/uL Final  . Eosinophils Relative 12/09/2015 6  % Final  . Eosinophils Absolute 12/09/2015 0.4  0.0 - 0.7 K/uL Final  . Basophils Relative 12/09/2015 0  % Final  .  Basophils Absolute 12/09/2015 0.0  0.0 - 0.1 K/uL Final  . Color, Urine 12/09/2015 AMBER* YELLOW Final  . APPearance 12/09/2015 HAZY* CLEAR Final  . Specific Gravity, Urine 12/09/2015 1.018  1.005 - 1.030 Final  . pH 12/09/2015 5.5  5.0 - 8.0 Final  . Glucose, UA 12/09/2015 NEGATIVE  NEGATIVE mg/dL Final  . Hgb urine dipstick 12/09/2015 NEGATIVE  NEGATIVE Final  . Bilirubin Urine 12/09/2015 MODERATE* NEGATIVE Final  . Ketones, ur 12/09/2015 15* NEGATIVE mg/dL Final  . Protein, ur 12/09/2015 30* NEGATIVE mg/dL Final  . Nitrite 12/09/2015 NEGATIVE  NEGATIVE Final  . Leukocytes, UA 12/09/2015 MODERATE* NEGATIVE Final  . Preg Test, Ur 12/09/2015 NEGATIVE  NEGATIVE Final   Comment:        THE SENSITIVITY OF THIS METHODOLOGY IS >20 mIU/mL.   Marland Kitchen Squamous Epithelial / LPF 12/09/2015 6-30* NONE SEEN Final  . WBC, UA 12/09/2015 6-30  0 - 5 WBC/hpf Final  . RBC / HPF 12/09/2015 NONE SEEN  0  - 5 RBC/hpf Final  . Bacteria, UA 12/09/2015 FEW* NONE SEEN Final   She has never had any renal involvement of her lupus per her knowledge She is taking 20 mg of pred BID- 40 total daily.  This helps a lot with her knee pain and other pains, but her right hip is still really bothersome to her.  Her left hip is a lot better since her joint replacmement She has been on this dose of prednisone for a couple of months.   Right now she is not taking any rx for pain- she cannot use NSAIDS due to prednisone use.  She has used tramadol in the past but it did not really help. She would appreciate having some pain medication to use when her pain is more severe.   Her ortho is Paramedic at [facility]- he has declined to write for pain meds for non -acute operative pain.    She had been working from home under ADA accomodation until July- however her job is no longer able to offer her this accomodation so she is not working currently  She has also noted urinary frequency over the last several months.    Patient Active Problem List   Diagnosis Date Noted  . Osteoarthritis of left hip 08/03/2015  . 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 Latex allergy 08/01/2015  . Weight gain due to medication 10/19/2014  . Depression with anxiety 08/06/2013  . Uses oral contraception 07/01/2012  . Lupus 08/09/2011  . Joint pain 07/23/2011    Past Medical History:  Diagnosis Date  . Arthritis   . Depression    takes Cymbalta daily  . Joint pain   . Joint swelling   . Lupus   . Polyarthralgia    takes Humira and Methotrexate  . Seizures ([place])    hx of-as a child. No meds    Past Surgical History:  Procedure Laterality Date  . TOTAL HIP ARTHROPLASTY Left 08/03/2015   Procedure: LEFT TOTAL HIP ARTHROPLASTY;  Surgeon: Garald Balding, MD;  Location: [place];  Service: Orthopedics;  Laterality: Left;    Social History  Substance Use Topics  . Smoking status: Never Smoker  . Smokeless tobacco: Not  on file  . Alcohol use No    No family history on file.  Allergies  Allergen Reactions  . Latex Hives    Medication list has been reviewed and updated.  Current Outpatient Prescriptions on File Prior to Visit  Medication Sig Dispense  Refill  . DULoxetine (CYMBALTA) 60 MG capsule Take 1 capsule (60 mg total) by mouth 2 (two) times daily. 60 capsule 1  . ibuprofen (ADVIL,MOTRIN) 200 MG tablet Take 400 mg by mouth every 6 (six) hours as needed for headache or moderate pain.    . magic mouthwash w/lidocaine SOLN Take 5 mLs by mouth 4 (four) times daily as needed for mouth pain. (Patient not taking: Reported on 12/09/2015) 120 mL 0  . meloxicam (MOBIC) 7.5 MG tablet Take 1 tablet (7.5 mg total) by mouth daily. 14 tablet 0  . Norgestim-Eth Estrad Triphasic (ORTHO TRI-CYCLEN LO PO) Take 1 tablet by mouth daily.     Marland Kitchen oxyCODONE-acetaminophen (PERCOCET/ROXICET) 5-325 MG tablet Take 1-2 tablets by mouth every 4 (four) hours as needed for severe pain. 15 tablet 0  . predniSONE (DELTASONE) 10 MG tablet Tale 2 pills (20 mg) twice a day and taper as instructed 80 tablet 0   No current facility-administered medications on file prior to visit.     Review of Systems:  As per HPI- otherwise negative.   Physical Examination: Vitals:   01/03/16 1325  BP: 133/82  Pulse: (!) 101  Temp: 97.7 F (36.5 C)   Vitals:   01/03/16 1325  Weight: 141 lb 9.6 oz (64.2 kg)  Height: 5' 2"  (1.575 m)   Body mass index is 25.9 kg/m. Ideal Body Weight: Weight in (lb) to have BMI = 25: 136.4  GEN: WDWN, NAD, Non-toxic, A & O x 3, appears her normal self.  Has mild moon facies  HEENT: Atraumatic, Normocephalic. Neck supple. No masses, No LAD. Ears and Nose: No external deformity. CV: RRR, No M/G/R. No JVD. No thrill. No extra heart sounds. PULM: CTA B, no wheezes, crackles, rhonchi. No retractions. No resp. distress. No accessory muscle use. EXTR: No c/c/e NEURO Normal gait for pt- uses a cane, has  difficulty with her gait due to her hip problems.   PSYCH: Normally interactive. Conversant. Not depressed or anxious appearing.  Calm demeanor.    Assessment and Plan: Autoimmune disorder ([place]) - Plan: predniSONE (DELTASONE) 10 MG tablet  Systemic lupus erythematosus, unspecified SLE type, unspecified organ involvement status ([place]) - Plan: predniSONE (DELTASONE) 10 MG tablet  Other secondary osteoarthritis of right hip - Plan: HYDROcodone-acetaminophen (NORCO/VICODIN) 5-325 MG tablet  Hypokalemia - Plan: Comprehensive metabolic panel  Thrombocytosis ([place]) - Plan: CBC  Urinary urgency - Plan: Urine culture  Here today for a follow-up visit Refilled her prednisone. Her sx are currently under control and as she does not have rheumatology care right now will continue her current dose or prednisone Did give her some hydrocodone to use as needed for hip pain Recent hypokalemia/ thrombocytosis.  Recheck for her today and take next step as indicated She has noted urinary urgency and also that she cannot walk as fast to the bathroom- this has caused occasional leakage.  Will check a urine culture to rule-out UTI, encouraged kegel exercises  Will plan further follow- up pending labs.     Signed Lamar Blinks, MD

## 2016-01-03 NOTE — Patient Instructions (Signed)
It was a pleasure to see you again in clinic today- I hope that you are able to get your hip surgery soon For the time being continue to use your prednisone. If you do decide to stop this please contact me to discuss your taper  You may use the hydrocodone pain medication as needed for severe pain; use this as little as you can. Remember it is habit forming and can be sedating  Take care and please see me in 3-4 months or as needed

## 2016-01-04 LAB — URINE CULTURE: Organism ID, Bacteria: NO GROWTH

## 2016-01-19 ENCOUNTER — Encounter: Payer: Self-pay | Admitting: Family Medicine

## 2016-01-23 ENCOUNTER — Telehealth (INDEPENDENT_AMBULATORY_CARE_PROVIDER_SITE_OTHER): Payer: Self-pay | Admitting: Orthopaedic Surgery

## 2016-01-23 ENCOUNTER — Other Ambulatory Visit: Payer: Self-pay | Admitting: Family Medicine

## 2016-01-23 DIAGNOSIS — F329 Major depressive disorder, single episode, unspecified: Secondary | ICD-10-CM

## 2016-01-23 DIAGNOSIS — F32A Depression, unspecified: Secondary | ICD-10-CM

## 2016-01-23 NOTE — Telephone Encounter (Signed)
Rose Campbell w/[employer] requesting most recent ov notes.  Please fax to the following:    [phone] and use ref# [id number]

## 2016-01-29 ENCOUNTER — Encounter: Payer: Self-pay | Admitting: Family Medicine

## 2016-01-29 ENCOUNTER — Telehealth: Payer: Self-pay

## 2016-01-29 ENCOUNTER — Telehealth: Payer: Self-pay | Admitting: Orthopaedic Surgery

## 2016-01-29 DIAGNOSIS — D8989 Other specified disorders involving the immune mechanism, not elsewhere classified: Secondary | ICD-10-CM

## 2016-01-29 DIAGNOSIS — M329 Systemic lupus erythematosus, unspecified: Secondary | ICD-10-CM

## 2016-01-29 DIAGNOSIS — M167 Other unilateral secondary osteoarthritis of hip: Secondary | ICD-10-CM

## 2016-01-29 MED ORDER — PREDNISONE 10 MG PO TABS: ORAL_TABLET | ORAL | 0 refills | Status: DC

## 2016-01-29 MED ORDER — HYDROCODONE-ACETAMINOPHEN 5-325 MG PO TABS: 1.0000 | ORAL_TABLET | Freq: Two times a day (BID) | ORAL | 0 refills | Status: DC | PRN

## 2016-01-29 NOTE — Telephone Encounter (Signed)
Sent clearance form to Taryn to send to dr

## 2016-01-29 NOTE — Addendum Note (Signed)
Addended by: Lamar Blinks C on: 01/29/2016 04:51 PM   Modules accepted: Orders

## 2016-01-29 NOTE — Telephone Encounter (Signed)
Received medical clearance form from [employer].  Pt last seen on 01/03/16 for establishment of care.   She will need a surgical clearance office visit.  Form given to [place], CMA.    Please call patient and schedule appt.

## 2016-01-29 NOTE — Telephone Encounter (Signed)
Patient states Dr. Silvestre Mesi has not received in information re platelets level.  Pt is to have hip surgery and would like to schedule.

## 2016-01-29 NOTE — Telephone Encounter (Signed)
She needs to be cleared by Dr Edilia Bo before we schedule

## 2016-01-29 NOTE — Telephone Encounter (Signed)
What to do??

## 2016-01-30 NOTE — Telephone Encounter (Signed)
Pt has been scheduled.  °

## 2016-01-30 NOTE — Telephone Encounter (Signed)
Called and LVM to inform patient to call the office and schedule an appointment for medical clearance.

## 2016-01-31 ENCOUNTER — Ambulatory Visit (INDEPENDENT_AMBULATORY_CARE_PROVIDER_SITE_OTHER): Payer: 59 | Admitting: Family Medicine

## 2016-01-31 ENCOUNTER — Encounter: Payer: Self-pay | Admitting: Family Medicine

## 2016-01-31 VITALS — BP 126/80 | HR 78 | Temp 97.9°F | Ht 62.0 in | Wt 155.2 lb

## 2016-01-31 DIAGNOSIS — Z8639 Personal history of other endocrine, nutritional and metabolic disease: Secondary | ICD-10-CM | POA: Diagnosis not present

## 2016-01-31 DIAGNOSIS — Z01818 Encounter for other preprocedural examination: Secondary | ICD-10-CM | POA: Diagnosis not present

## 2016-01-31 LAB — BASIC METABOLIC PANEL
BUN: 14 mg/dL (ref 6–23)
CO2: 28 mEq/L (ref 19–32)
CREATININE: 0.6 mg/dL (ref 0.40–1.20)
Calcium: 8.9 mg/dL (ref 8.4–10.5)
Chloride: 99 mEq/L (ref 96–112)
GFR: 151.67 mL/min (ref >60.00)
Glucose, Bld: 91 mg/dL (ref 70–99)
POTASSIUM: 3.9 meq/L (ref 3.5–5.1)
Sodium: 136 mEq/L (ref 135–145)

## 2016-01-31 NOTE — Progress Notes (Signed)
Pre visit review using our clinic review tool, if applicable. No additional management support is needed unless otherwise documented below in the visit note. 

## 2016-01-31 NOTE — Patient Instructions (Signed)
It was very nice to see you today!  Take care and I will be in touch with your labs asap. Assuming that your labs are ok you will be cleared for your operation.  Continue your slow taper of prednisone until you see rheumatology- if you start to not feel well please let me know

## 2016-01-31 NOTE — Progress Notes (Addendum)
[place] at [facility] [address], [place], [place] [address] [phone]  Date:  01/31/2016   Name:  Rose Campbell   DOB:  11-04-1985   MRN:  [id number]  PCP:  Lamar Blinks, MD    Chief Complaint: Surgical Clearance (Pt here to have surgical clearance form filled out to have surgefry on right hip. )   History of Present Illness:  Rose Campbell is a 31 y.o. very pleasant female patient who presents with the following:  History of lupus which has caused significant joint damage at a young age.  She had her left hip replaced last summer and this has done well. She has much less left hip pain since her operation. However she is now limited by pain in her RIGHT hip and would like to have this one replaced as well.  Dr. Durward Fortes at [facility] ortho will do this procedure for her  She was noted to have hypokalemia in November- however this had normalized by last labs on 12/13.   She is on cymbalta, prednisone, pain medication as needed and OCP Allergic to latex.   She had been on 40 mg of prednisone for  3+ weeks due to exacerbation of her lupus sx- she is now tapering by 10 mg per week and is on 30 mg a day.  She has a rheum appt on 1/22- will still be on 20 mg at that time She hopes to get her operation ASAP- hopefully next month.    She had no issues with her other hip replacement and her hospital course was ok Normal EKG 06/2015.    She does not have any CP with activity.   She is able to vacuum the floors at her home- she is quite limited on other activities due to her orthopedic problems.  She is able to climb a flight of stairs slowly- tries to limit this to once a day She is not known to have any cardiac problems.  No family history of CAD as far as she knows.    She is a non smoker, no drugs, she does not drink alcohol  She had a good holiday Patient Active Problem List   Diagnosis Date Noted  . Osteoarthritis of left  hip 08/03/2015  . 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 Latex allergy 08/01/2015  . Weight gain due to medication 10/19/2014  . Depression with anxiety 08/06/2013  . Uses oral contraception 07/01/2012  . Lupus 08/09/2011  . Joint pain 07/23/2011    Past Medical History:  Diagnosis Date  . Arthritis   . Depression    takes Cymbalta daily  . Joint pain   . Joint swelling   . Lupus   . Polyarthralgia    takes Humira and Methotrexate  . Seizures ([place])    hx of-as a child. No meds    Past Surgical History:  Procedure Laterality Date  . TOTAL HIP ARTHROPLASTY Left 08/03/2015   Procedure: LEFT TOTAL HIP ARTHROPLASTY;  Surgeon: Garald Balding, MD;  Location: [place];  Service: Orthopedics;  Laterality: Left;    Social History  Substance Use Topics  . Smoking status: Never Smoker  . Smokeless tobacco: Not on file  . Alcohol use No    No family history on file.  Allergies  Allergen Reactions  . Latex Hives    Medication list has been reviewed and updated.  Current Outpatient Prescriptions on File Prior to Visit  Medication Sig  Dispense Refill  . DULoxetine (CYMBALTA) 60 MG capsule TAKE 1 CAPSULE BY MOUTH TWO TIMES DAILY 60 capsule 1  . HYDROcodone-acetaminophen (NORCO/VICODIN) 5-325 MG tablet Take 1 tablet by mouth 2 (two) times daily as needed. Use for chronic hip arthritis pain 60 tablet 0  . Norgestim-Eth Estrad Triphasic (ORTHO TRI-CYCLEN LO PO) Take 1 tablet by mouth daily.     . predniSONE (DELTASONE) 10 MG tablet Tale 2 pills (20 mg) twice a day and taper as instructed 80 tablet 0   No current facility-administered medications on file prior to visit.     Review of Systems: No fever, chills, rash Positive for weight gain, joint pain, fatigue No nausea, vomiting,Diarrhea  Wt Readings from Last 3 Encounters:  01/31/16 155 lb 3.2 oz (70.4 kg)  01/03/16 141 lb 9.6 oz (64.2 kg)  10/12/15 141 lb (64 kg)    As per HPI- otherwise negative.   Physical  Examination: Blood pressure 126/80, pulse 78, temperature 97.9 F (36.6 C), temperature source Oral, height 5\' 2"  (1.575 m), weight 155 lb 3.2 oz (70.4 kg), last menstrual period 01/22/2016, SpO2 96 %.  GEN: WDWN, NAD, Non-toxic, A & O x 3 HEENT: Atraumatic, Normocephalic. Neck supple. No masses, No LAD. Ears and Nose: No external deformity. CV: RRR, No M/G/R. No JVD. No thrill. No extra heart sounds. PULM: CTA B, no wheezes, crackles, rhonchi. No retractions. No resp. distress. No accessory muscle use. ABD: S, NT, ND EXTR: No c/c/e NEURO typical gait for this pt- uses a cane. No pain with flexion or ROM of the LEFT hip, pain with flexion of the right hip PSYCH: Normally interactive. Conversant. Not depressed or anxious appearing.  Calm demeanor.    Assessment and Plan: Pre-operative clearance  History of hypokalemia - Plan: Basic metabolic panel  Here today for pre-operative clearance  She had a normal CXR and EKG prior to her other hip replacement 6 months ago Assuming normal K today she will be cleared  Advised ortho to seek medicine consultation for stress dosing of her steroids at the time of operation  Signed Lamar Blinks, MD  Received labs- will complete pre-op clearance form for her.  She has borderline functional capacity but no risk factors so will clear for surgery  Results for orders placed or performed in visit on [id number]  Basic metabolic panel  Result Value Ref Range   Sodium 136 135 - 145 mEq/L   Potassium 3.9 3.5 - 5.1 mEq/L   Chloride 99 96 - 112 mEq/L   CO2 28 19 - 32 mEq/L   Glucose, Bld 91 70 - 99 mg/dL   BUN 14 6 - 23 mg/dL   Creatinine, Ser 0.60 0.40 - 1.20 mg/dL   Calcium 8.9 8.4 - 10.5 mg/dL   GFR 151.67 >60.00 mL/min

## 2016-02-12 NOTE — Telephone Encounter (Signed)
11/06/2015 SRS NOTE FAXED

## 2016-02-21 ENCOUNTER — Encounter (INDEPENDENT_AMBULATORY_CARE_PROVIDER_SITE_OTHER): Payer: Self-pay | Admitting: Orthopedic Surgery

## 2016-02-21 ENCOUNTER — Ambulatory Visit (INDEPENDENT_AMBULATORY_CARE_PROVIDER_SITE_OTHER): Payer: 59 | Admitting: Orthopedic Surgery

## 2016-02-21 ENCOUNTER — Encounter: Payer: Self-pay | Admitting: Family Medicine

## 2016-02-21 VITALS — BP 116/86 | HR 135 | Temp 99.6°F | Resp 12 | Ht 61.0 in | Wt 160.0 lb

## 2016-02-21 DIAGNOSIS — M1611 Unilateral primary osteoarthritis, right hip: Secondary | ICD-10-CM | POA: Diagnosis not present

## 2016-02-21 DIAGNOSIS — M458 Ankylosing spondylitis sacral and sacrococcygeal region: Secondary | ICD-10-CM

## 2016-02-21 NOTE — Progress Notes (Signed)
Joni Fears, MD   Biagio Borg, PA-C [address], [place], [place]  [address]                             [phone]   [name] MRN:  [id number] DOB/SEX:  [birth year]/female  ORTHOPAEDIC HISTORY & PHYSICAL  CHIEF COMPLAINT:  Painful right Hip  HISTORY: Rose Campbell a 31 y.o. female  Who has a history of pain and functional disability in the right hip(s) due to arthritis and patient has failed non-surgical conservative treatments for greater than 12 weeks to include NSAID's and/or analgesics, corticosteriod injections, flexibility and strengthening excercises, supervised PT with diminished ADL's post treatment, use of assistive devices and activity modification.  Onset of symptoms was abrupt starting 7 years ago with gradually worsening course since that time.The patient noted no past surgery on the right hip(s).  Patient currently rates pain in the right hip at 9 out of 10 with activity. Patient has night pain, worsening of pain with activity and weight bearing, trendelenberg gait, pain that interfers with activities of daily living, pain with passive range of motion and crepitus. Patient has evidence of subchondral cysts, subchondral sclerosis, periarticular osteophytes and joint space narrowing by imaging studies. This condition presents safety issues increasing the risk of falls. There is no current active infection.  PAST MEDICAL HISTORY: Patient Active Problem List   Diagnosis Date Noted  . Osteoarthritis of left hip 08/03/2015  . 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 Latex allergy 08/01/2015  . Weight gain due to medication 10/19/2014  . Depression with anxiety 08/06/2013  . Uses oral contraception 07/01/2012  . Lupus 08/09/2011  . Joint pain 07/23/2011   Past Medical History:  Diagnosis Date  . Arthritis   . Depression    takes Cymbalta daily  . Joint pain   . Joint swelling   . Lupus   . Polyarthralgia    takes Humira and Methotrexate  .  Seizures ([place])    hx of-as a child. No meds   Past Surgical History:  Procedure Laterality Date  . TOTAL HIP ARTHROPLASTY Left 08/03/2015   Procedure: LEFT TOTAL HIP ARTHROPLASTY;  Surgeon: Garald Balding, MD;  Location: [place];  Service: Orthopedics;  Laterality: Left;     MEDICATIONS PRIOR TO ADMISSION:  Current Outpatient Prescriptions:  .  DULoxetine (CYMBALTA) 60 MG capsule, TAKE 1 CAPSULE BY MOUTH TWO TIMES DAILY, Disp: 60 capsule, Rfl: 1 .  HYDROcodone-acetaminophen (NORCO/VICODIN) 5-325 MG tablet, Take 1 tablet by mouth 2 (two) times daily as needed. Use for chronic hip arthritis pain, Disp: 60 tablet, Rfl: 0 .  Norgestim-Eth Estrad Triphasic (ORTHO TRI-CYCLEN LO PO), Take 1 tablet by mouth daily. , Disp: , Rfl:  .  predniSONE (DELTASONE) 10 MG tablet, Tale 2 pills (20 mg) twice a day and taper as instructed (Patient taking differently: Take 10 mg by mouth daily with breakfast. ), Disp: 80 tablet, Rfl: 0   ALLERGIES:   Allergies  Allergen Reactions  . Latex Hives    REVIEW OF SYSTEMS:  Review of Systems  Constitutional: Positive for malaise/fatigue.  HENT: Negative.   Eyes: Negative.   Respiratory: Negative.   Gastrointestinal: Positive for nausea (in AM).  Genitourinary: Negative.   Musculoskeletal: Positive for back pain, joint pain and myalgias.  Skin: Negative.   Neurological: Positive for weakness and headaches.  Endo/Heme/Allergies: Positive for polydipsia.  Psychiatric/Behavioral: The patient has insomnia.  FAMILY HISTORY:  No family history on file.  SOCIAL HISTORY:   Social History   Occupational History  . Not on file.   Social History Main Topics  . Smoking status: Never Smoker  . Smokeless tobacco: Never Used  . Alcohol use No  . Drug use: No  . Sexual activity: Yes    Birth control/ protection: Pill     EXAMINATION:  Vital signs in last 24 hours: BP 116/86 (BP Location: Right Arm, Patient Position: Sitting, Cuff Size: Normal)   Pulse  (!) 135   Temp 99.6 F (37.6 C)   Resp 12   Ht 5\' 1"  (1.549 m)   Wt 160 lb (72.6 kg)   LMP 01/22/2016   BMI 30.23 kg/m   Physical Exam  Constitutional: She is oriented to person, place, and time. She appears well-developed and well-nourished.  HENT:  Head: Normocephalic and atraumatic.  Eyes: Conjunctivae and EOM are normal. Pupils are equal, round, and reactive to light.  Neck: Neck supple.  No carotid bruits  Cardiovascular: Normal rate, regular rhythm, normal heart sounds and intact distal pulses.   Pulmonary/Chest: Effort normal and breath sounds normal.  Abdominal: Soft. Bowel sounds are normal.  Neurological: She is alert and oriented to person, place, and time.  Skin: Skin is warm and dry.  Psychiatric: She has a normal mood and affect. Her behavior is normal. Judgment and thought content normal.   Right Hip Exam   Range of Motion  Flexion: 90  Internal Rotation: 0  External Rotation: 10  Abduction:  20 normal  Adduction: 10   Comments:  Pain with range of motion. Range of motion is certainly limited as noted above.      Imaging Review Plain radiographs demonstrate moderate degenerative joint disease of the right hip. The bone quality appears to be good for age and reported activity level.  Assessment: End stage arthritis, right Hip  Past Medical History:  Diagnosis Date  . Arthritis   . Depression    takes Cymbalta daily  . Joint pain   . Joint swelling   . Lupus   . Polyarthralgia    takes Humira and Methotrexate  . Seizures ([place])    hx of-as a child. No meds    Plan: for right total hip replacement.  The patient history, physical examination, clinical judgement of the provider and imaging studies are consistent with end stage degenerative joint disease of the right hip(s) and total hip arthroplasty is deemed medically necessary. The treatment options including medical management, injection therapy, arthroscopy and arthroplasty were discussed at  length. The risks and benefits of total hip arthroplasty were presented and reviewed. The risks due to aseptic loosening, infection, stiffness, dislocation/subluxation,  thromboembolic complications and other imponderables were discussed.  The patient acknowledged the explanation, agreed to proceed with the plan. The clearance notes recently received were reviewed and concurs with proceeding then surgical intervention.  Patient is being admitted for inpatient treatment for surgery, pain control, PT, OT, prophylactic antibiotics, VTE prophylaxis, progressive ambulation and ADL's and discharge planning.The patient is planning to be discharged home with home health services   Biagio Borg 02/21/2016, 2:00 PM

## 2016-02-21 NOTE — Progress Notes (Deleted)
   Office Visit Note   Patient: Rose Campbell           Date of Birth: 09-26-1985           MRN: [id number] Visit Date: 02/21/2016              Requested by: Darreld Mclean, MD [address], [place] [address] PCP: Lamar Blinks, MD   Assessment & Plan: Visit Diagnoses: No diagnosis found.  Plan: ***  Follow-Up Instructions: Return if symptoms worsen or fail to improve.   Orders:  No orders of the defined types were placed in this encounter.  No orders of the defined types were placed in this encounter.     Procedures: No procedures performed   Clinical Data: No additional findings.   Subjective: Chief Complaint  Patient presents with  . Right Hip - Pre-op Exam  . Pre-op Exam    Total right hip replacement - preop, 03/05/16    Review of Systems   Objective: Vital Signs: BP 116/86 (BP Location: Right Arm, Patient Position: Sitting, Cuff Size: Normal)   Pulse (!) 135   Temp 99.6 F (37.6 C)   Resp 12   Ht 5\' 1"  (1.549 m)   Wt 160 lb (72.6 kg)   LMP 01/22/2016   BMI 30.23 kg/m   Physical Exam  Ortho Exam  Specialty Comments:  No specialty comments available.  Imaging: No results found.   PMFS History: Patient Active Problem List   Diagnosis Date Noted  . Osteoarthritis of left hip 08/03/2015  . 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 Latex allergy 08/01/2015  . Weight gain due to medication 10/19/2014  . Depression with anxiety 08/06/2013  . Uses oral contraception 07/01/2012  . Lupus 08/09/2011  . Joint pain 07/23/2011   Past Medical History:  Diagnosis Date  . Arthritis   . Depression    takes Cymbalta daily  . Joint pain   . Joint swelling   . Lupus   . Polyarthralgia    takes Humira and Methotrexate  . Seizures ([place])    hx of-as a child. No meds    No family history on file.  Past Surgical History:  Procedure Laterality Date  . TOTAL HIP ARTHROPLASTY Left 08/03/2015   Procedure: LEFT TOTAL  HIP ARTHROPLASTY;  Surgeon: Garald Balding, MD;  Location: [facility];  Service: Orthopedics;  Laterality: Left;   Social History   Occupational History  . Not on file.   Social History Main Topics  . Smoking status: Never Smoker  . Smokeless tobacco: Never Used  . Alcohol use No  . Drug use: No  . Sexual activity: Yes    Birth control/ protection: Pill

## 2016-02-23 NOTE — Pre-Procedure Instructions (Signed)
    Rose Campbell  02/23/2016      [facility], [place] [address] Phone: [phone] Fax: [place], [facility] [address] [place] [address] Phone: [phone] Fax: [phone]    Your procedure is scheduled on 03/05/16.  Report to [facility] at 530 A.M.  Call this number if you have problems the morning of surgery:  [phone]   Remember:  Do not eat food or drink liquids after midnight.  Take these medicines the morning of surgery with A SIP OF WATER --cymbalta,hydrocodon,bcp   Do not wear jewelry, make-up or nail polish.  Do not wear lotions, powders, or perfumes, or deoderant.  Do not shave 48 hours prior to surgery.  Men may shave face and neck.  Do not bring valuables to the hospital.  [facility] is not responsible for any belongings or valuables.  Contacts, dentures or bridgework may not be worn into surgery.  Leave your suitcase in the car.  After surgery it may be brought to your room.  For patients admitted to the hospital, discharge time will be determined by your treatment team.  Patients discharged the day of surgery will not be allowed to drive home.   Name and phone number of your driver:   Special instructions:  Do not take any aspirin,anti-inflammatories,vitamins,or herbal supplements 5-7 days prior to surgery.  Please read over the following fact sheets that you were given. MRSA Information

## 2016-02-26 ENCOUNTER — Encounter (HOSPITAL_COMMUNITY)
Admission: RE | Admit: 2016-02-26 | Discharge: 2016-02-26 | Disposition: A | Discharge status: Home / Self Care | Payer: 59 | Source: Ambulatory Visit | Attending: Orthopaedic Surgery | Admitting: Orthopaedic Surgery

## 2016-02-26 ENCOUNTER — Encounter (HOSPITAL_COMMUNITY): Payer: Self-pay

## 2016-02-26 DIAGNOSIS — M1611 Unilateral primary osteoarthritis, right hip: Secondary | ICD-10-CM | POA: Diagnosis not present

## 2016-02-26 DIAGNOSIS — Z01812 Encounter for preprocedural laboratory examination: Secondary | ICD-10-CM | POA: Insufficient documentation

## 2016-02-26 LAB — PROTIME-INR
INR: 1.03
Prothrombin Time: 13.5 seconds (ref 11.4–15.2)

## 2016-02-26 LAB — COMPREHENSIVE METABOLIC PANEL
ALBUMIN: 3.3 g/dL — AB (ref 3.5–5.0)
ALT: 17 U/L (ref 14–54)
ANION GAP: 9 (ref 5–15)
AST: 19 U/L (ref 15–41)
Alkaline Phosphatase: 73 U/L (ref 38–126)
BUN: 11 mg/dL (ref 6–20)
CO2: 25 mmol/L (ref 22–32)
Calcium: 8.9 mg/dL (ref 8.9–10.3)
Chloride: 105 mmol/L (ref 101–111)
Creatinine, Ser: 0.66 mg/dL (ref 0.44–1.00)
GFR calc Af Amer: >60 mL/min (ref >60)
GFR calc non Af Amer: >60 mL/min (ref >60)
GLUCOSE: 81 mg/dL (ref 65–99)
POTASSIUM: 3.8 mmol/L (ref 3.5–5.1)
SODIUM: 139 mmol/L (ref 135–145)
Total Bilirubin: 0.5 mg/dL (ref 0.3–1.2)
Total Protein: 6.6 g/dL (ref 6.5–8.1)

## 2016-02-26 LAB — CBC WITH DIFFERENTIAL/PLATELET
BASOS PCT: 0 %
Basophils Absolute: 0 10*3/uL (ref 0.0–0.1)
EOS ABS: 0.2 10*3/uL (ref 0.0–0.7)
Eosinophils Relative: 2 %
HEMATOCRIT: 37.8 % (ref 36.0–46.0)
HEMOGLOBIN: 11.5 g/dL — AB (ref 12.0–15.0)
Lymphocytes Relative: 34 %
Lymphs Abs: 3.4 10*3/uL (ref 0.7–4.0)
MCH: 25.2 pg — ABNORMAL LOW (ref 26.0–34.0)
MCHC: 30.4 g/dL (ref 30.0–36.0)
MCV: 82.7 fL (ref 78.0–100.0)
MONO ABS: 0.6 10*3/uL (ref 0.1–1.0)
MONOS PCT: 6 %
NEUTROS ABS: 5.8 10*3/uL (ref 1.7–7.7)
NEUTROS PCT: 58 %
Platelets: 449 10*3/uL — ABNORMAL HIGH (ref 150–400)
RBC: 4.57 MIL/uL (ref 3.87–5.11)
RDW: 16.1 % — AB (ref 11.5–15.5)
WBC: 10.1 10*3/uL (ref 4.0–10.5)

## 2016-02-26 LAB — APTT: APTT: 30 s (ref 24–36)

## 2016-02-26 LAB — SURGICAL PCR SCREEN
MRSA, PCR: NEGATIVE
STAPHYLOCOCCUS AUREUS: NEGATIVE

## 2016-02-26 LAB — HCG, SERUM, QUALITATIVE: PREG SERUM: NEGATIVE

## 2016-02-27 LAB — URINE CULTURE

## 2016-02-27 NOTE — H&P (Signed)
Rose Campbell MRN:  [id number] DOB/SEX:  [birth year]/female  ORTHOPAEDIC HISTORY & PHYSICAL  CHIEF COMPLAINT:  Painful right Hip  HISTORY: Rose Holmesis a 31 y.o. female  Who has a history of pain and functional disability in the right hip(s) due to arthritis and patient has failed non-surgical conservative treatments for greater than 12 weeks to include NSAID's and/or analgesics, corticosteriod injections, flexibility and strengthening excercises, supervised PT with diminished ADL's post treatment, use of assistive devices and activity modification.  Onset of symptoms was abrupt starting 7 years ago with gradually worsening course since that time.The patient noted no past surgery on the right hip(s).  Patient currently rates pain in the right hip at 9 out of 10 with activity. Patient has night pain, worsening of pain with activity and weight bearing, trendelenberg gait, pain that interfers with activities of daily living, pain with passive range of motion and crepitus. Patient has evidence of subchondral cysts, subchondral sclerosis, periarticular osteophytes and joint space narrowing by imaging studies. This condition presents safety issues increasing the risk of falls.There is no current active infection.  PAST MEDICAL HISTORY:     Patient Active Problem List   Diagnosis Date Noted  . Osteoarthritis of left hip 08/03/2015  . 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 Latex allergy 08/01/2015  . Weight gain due to medication 10/19/2014  . Depression with anxiety 08/06/2013  . Uses oral contraception 07/01/2012  . Lupus 08/09/2011  . Joint pain 07/23/2011       Past Medical History:  Diagnosis Date  . Arthritis   . Depression    takes Cymbalta daily  . Joint pain   . Joint swelling   . Lupus   . Polyarthralgia    takes Humira and Methotrexate  . Seizures ([place])    hx of-as a child. No meds        Past Surgical History:  Procedure Laterality Date  . TOTAL  HIP ARTHROPLASTY Left 08/03/2015   Procedure: LEFT TOTAL HIP ARTHROPLASTY;  Surgeon: Garald Balding, MD;  Location: [facility];  Service: Orthopedics;  Laterality: Left;     MEDICATIONS PRIOR TO ADMISSION:  Current Outpatient Prescriptions:  .  DULoxetine (CYMBALTA) 60 MG capsule, TAKE 1 CAPSULE BY MOUTH TWO TIMES DAILY, Disp: 60 capsule, Rfl: 1 .  HYDROcodone-acetaminophen (NORCO/VICODIN) 5-325 MG tablet, Take 1 tablet by mouth 2 (two) times daily as needed. Use for chronic hip arthritis pain, Disp: 60 tablet, Rfl: 0 .  Norgestim-Eth Estrad Triphasic (ORTHO TRI-CYCLEN LO PO), Take 1 tablet by mouth daily. , Disp: , Rfl:  .  predniSONE (DELTASONE) 10 MG tablet, Tale 2 pills (20 mg) twice a day and taper as instructed (Patient taking differently: Take 10 mg by mouth daily with breakfast. ), Disp: 80 tablet, Rfl: 0   ALLERGIES:       Allergies  Allergen Reactions  . Latex Hives    REVIEW OF SYSTEMS:  Review of Systems  Constitutional: Positive for malaise/fatigue.  HENT: Negative.   Eyes: Negative.   Respiratory: Negative.   Gastrointestinal: Positive for nausea (in AM).  Genitourinary: Negative.   Musculoskeletal: Positive for back pain, joint pain and myalgias.  Skin: Negative.   Neurological: Positive for weakness and headaches.  Endo/Heme/Allergies: Positive for polydipsia.  Psychiatric/Behavioral: The patient has insomnia.     FAMILY HISTORY:  No family history on file.  SOCIAL HISTORY:   Social History      Occupational History  . Not on file.  Social History Main Topics  . Smoking status: Never Smoker  . Smokeless tobacco: Never Used  . Alcohol use No  . Drug use: No  . Sexual activity: Yes    Birth control/ protection: Pill     EXAMINATION:  Vital signs in last 24 hours: BP 116/86 (BP Location: Right Arm, Patient Position: Sitting, Cuff Size: Normal)   Pulse (!) 135   Temp 99.6 F (37.6 C)   Resp 12   Ht 5\' 1"  (1.549 m)   Wt 160 lb  (72.6 kg)   LMP 01/22/2016   BMI 30.23 kg/m   Physical Exam  Constitutional: She is oriented to person, place, and time. She appears well-developed and well-nourished.  HENT:  Head: Normocephalic and atraumatic.  Eyes: Conjunctivae and EOM are normal. Pupils are equal, round, and reactive to light.  Neck: Neck supple.  No carotid bruits  Cardiovascular: Normal rate, regular rhythm, normal heart sounds and intact distal pulses.   Pulmonary/Chest: Effort normal and breath sounds normal.  Abdominal: Soft. Bowel sounds are normal.  Neurological: She is alert and oriented to person, place, and time.  Skin: Skin is warm and dry.  Psychiatric: She has a normal mood and affect. Her behavior is normal. Judgment and thought content normal.   Right Hip Exam   Range of Motion  Flexion: 90  Internal Rotation: 0  External Rotation: 10  Abduction:  20 normal  Adduction: 10   Comments:  Pain with range of motion. Range of motion is certainly limited as noted above.     Imaging Review Plain radiographs demonstrate moderate degenerative joint disease of the right hip. The bone quality appears to be good for age and reported activity level.  Assessment: End stage arthritis, right Hip      Past Medical History:  Diagnosis Date  . Arthritis   . Depression    takes Cymbalta daily  . Joint pain   . Joint swelling   . Lupus   . Polyarthralgia    takes Humira and Methotrexate  . Seizures ([place])    hx of-as a child. No meds    Plan: for right total hip replacement.  The patient history, physical examination, clinical judgement of the provider and imaging studies are consistent with end stage degenerative joint disease of the right hip(s) and total hip arthroplasty is deemed medically necessary. The treatment options including medical management, injection therapy, arthroscopy and arthroplasty were discussed at length. The risks and benefits of total hip arthroplasty  were presented and reviewed. The risks due to aseptic loosening, infection, stiffness, dislocation/subluxation,  thromboembolic complications and other imponderables were discussed.  The patient acknowledged the explanation, agreed to proceed with the plan. The clearance notes recently received were reviewed and concurs with proceeding then surgical intervention.  Patient is being admitted for inpatient treatment for surgery, pain control, PT, OT, prophylactic antibiotics, VTE prophylaxis, progressive ambulation and ADL's and discharge planning.The patient is planning to be discharged home with home health services   Biagio Borg, PA-C 02/21/2016, 2:00 PM

## 2016-02-28 NOTE — Telephone Encounter (Signed)
I completed paperwork for Rose Campbell for her- [facility] that this is ready for her to pick up

## 2016-03-04 MED ORDER — TRANEXAMIC ACID 1000 MG/10ML IV SOLN
2000.0000 mg | INTRAVENOUS | Status: DC
  Filled 2016-03-04: qty 20

## 2016-03-05 ENCOUNTER — Inpatient Hospital Stay (HOSPITAL_COMMUNITY): Payer: 59 | Admitting: Anesthesiology

## 2016-03-05 ENCOUNTER — Inpatient Hospital Stay (HOSPITAL_COMMUNITY)
Admission: RE | Admit: 2016-03-05 | Discharge: 2016-03-07 | DRG: 470 | Disposition: A | Discharge status: Home Health | Payer: 59 | Source: Ambulatory Visit | Attending: Orthopaedic Surgery | Admitting: Orthopaedic Surgery

## 2016-03-05 ENCOUNTER — Encounter (HOSPITAL_COMMUNITY)
Admission: RE | Disposition: A | Discharge status: Home Health | Payer: Self-pay | Source: Ambulatory Visit | Attending: Orthopaedic Surgery

## 2016-03-05 ENCOUNTER — Inpatient Hospital Stay (HOSPITAL_COMMUNITY): Payer: 59

## 2016-03-05 ENCOUNTER — Encounter (HOSPITAL_COMMUNITY): Payer: Self-pay | Admitting: *Deleted

## 2016-03-05 DIAGNOSIS — Z793 Long term (current) use of hormonal contraceptives: Secondary | ICD-10-CM

## 2016-03-05 DIAGNOSIS — M1611 Unilateral primary osteoarthritis, right hip: Principal | ICD-10-CM | POA: Diagnosis present

## 2016-03-05 DIAGNOSIS — Z79899 Other long term (current) drug therapy: Secondary | ICD-10-CM

## 2016-03-05 DIAGNOSIS — Z96641 Presence of right artificial hip joint: Secondary | ICD-10-CM

## 2016-03-05 DIAGNOSIS — Z96642 Presence of left artificial hip joint: Secondary | ICD-10-CM | POA: Diagnosis present

## 2016-03-05 DIAGNOSIS — Z9889 Other specified postprocedural states: Secondary | ICD-10-CM

## 2016-03-05 DIAGNOSIS — D62 Acute posthemorrhagic anemia: Secondary | ICD-10-CM | POA: Diagnosis not present

## 2016-03-05 DIAGNOSIS — Z91040 Latex allergy status: Secondary | ICD-10-CM

## 2016-03-05 DIAGNOSIS — Z7952 Long term (current) use of systemic steroids: Secondary | ICD-10-CM | POA: Diagnosis not present

## 2016-03-05 DIAGNOSIS — M329 Systemic lupus erythematosus, unspecified: Secondary | ICD-10-CM | POA: Diagnosis present

## 2016-03-05 DIAGNOSIS — F329 Major depressive disorder, single episode, unspecified: Secondary | ICD-10-CM | POA: Diagnosis present

## 2016-03-05 DIAGNOSIS — Z96649 Presence of unspecified artificial hip joint: Secondary | ICD-10-CM

## 2016-03-05 DIAGNOSIS — D8989 Other specified disorders involving the immune mechanism, not elsewhere classified: Secondary | ICD-10-CM

## 2016-03-05 HISTORY — PX: TOTAL HIP ARTHROPLASTY: SHX124

## 2016-03-05 LAB — URINALYSIS, ROUTINE W REFLEX MICROSCOPIC
BILIRUBIN URINE: NEGATIVE
Glucose, UA: NEGATIVE mg/dL
HGB URINE DIPSTICK: NEGATIVE
Ketones, ur: NEGATIVE mg/dL
Leukocytes, UA: NEGATIVE
NITRITE: NEGATIVE
Protein, ur: NEGATIVE mg/dL
SPECIFIC GRAVITY, URINE: 1.018 (ref 1.005–1.030)
pH: 6 (ref 5.0–8.0)

## 2016-03-05 SURGERY — ARTHROPLASTY, HIP, TOTAL,POSTERIOR APPROACH
Anesthesia: General | Site: Hip | Laterality: Right

## 2016-03-05 MED ORDER — HYDROMORPHONE HCL 1 MG/ML IJ SOLN
INTRAMUSCULAR | Status: AC
  Filled 2016-03-05: qty 0.5

## 2016-03-05 MED ORDER — MENTHOL 3 MG MT LOZG: 1.0000 | LOZENGE | OROMUCOSAL | Status: DC | PRN

## 2016-03-05 MED ORDER — PREDNISONE 10 MG PO TABS
10.0000 mg | ORAL_TABLET | Freq: Every day | ORAL | Status: DC
  Administered 2016-03-06 – 2016-03-07 (×2): 10 mg via ORAL
  Filled 2016-03-05 (×2): qty 1

## 2016-03-05 MED ORDER — DEXTROSE 5 % IV SOLN
INTRAVENOUS | Status: DC | PRN
  Administered 2016-03-05: 07:00:00 via INTRAVENOUS

## 2016-03-05 MED ORDER — HYDROCORTISONE NA SUCCINATE PF 1000 MG IJ SOLR
INTRAMUSCULAR | Status: DC | PRN
  Administered 2016-03-05: 100 mg via INTRAVENOUS

## 2016-03-05 MED ORDER — METHOCARBAMOL 1000 MG/10ML IJ SOLN
500.0000 mg | Freq: Four times a day (QID) | INTRAVENOUS | Status: DC | PRN
  Filled 2016-03-05: qty 5

## 2016-03-05 MED ORDER — LIDOCAINE 2% (20 MG/ML) 5 ML SYRINGE
INTRAMUSCULAR | Status: AC
  Filled 2016-03-05: qty 5

## 2016-03-05 MED ORDER — LACTATED RINGERS IV SOLN
INTRAVENOUS | Status: DC | PRN
  Administered 2016-03-05 (×2): via INTRAVENOUS

## 2016-03-05 MED ORDER — VECURONIUM BROMIDE 10 MG IV SOLR
INTRAVENOUS | Status: AC
  Filled 2016-03-05: qty 10

## 2016-03-05 MED ORDER — TRANEXAMIC ACID 1000 MG/10ML IV SOLN
2000.0000 mg | INTRAVENOUS | Status: DC
  Filled 2016-03-05: qty 20

## 2016-03-05 MED ORDER — ACETAMINOPHEN 10 MG/ML IV SOLN
1000.0000 mg | Freq: Once | INTRAVENOUS | Status: AC
  Administered 2016-03-05: 1000 mg via INTRAVENOUS

## 2016-03-05 MED ORDER — BUPIVACAINE HCL (PF) 0.25 % IJ SOLN
INTRAMUSCULAR | Status: AC
  Filled 2016-03-05: qty 30

## 2016-03-05 MED ORDER — ONDANSETRON HCL 4 MG PO TABS: 4.0000 mg | ORAL_TABLET | Freq: Four times a day (QID) | ORAL | Status: DC | PRN

## 2016-03-05 MED ORDER — KETOROLAC TROMETHAMINE 15 MG/ML IJ SOLN
15.0000 mg | Freq: Four times a day (QID) | INTRAMUSCULAR | Status: AC
  Administered 2016-03-05 – 2016-03-06 (×4): 15 mg via INTRAVENOUS
  Filled 2016-03-05 (×4): qty 1

## 2016-03-05 MED ORDER — SUGAMMADEX SODIUM 200 MG/2ML IV SOLN
INTRAVENOUS | Status: AC
  Filled 2016-03-05: qty 2

## 2016-03-05 MED ORDER — DULOXETINE HCL 60 MG PO CPEP
60.0000 mg | ORAL_CAPSULE | Freq: Two times a day (BID) | ORAL | Status: DC
  Administered 2016-03-05 – 2016-03-07 (×4): 60 mg via ORAL
  Filled 2016-03-05 (×5): qty 1

## 2016-03-05 MED ORDER — MIDAZOLAM HCL 5 MG/5ML IJ SOLN
INTRAMUSCULAR | Status: DC | PRN
  Administered 2016-03-05: 2 mg via INTRAVENOUS

## 2016-03-05 MED ORDER — TRANEXAMIC ACID 1000 MG/10ML IV SOLN
INTRAVENOUS | Status: DC | PRN
  Administered 2016-03-05: 2000 mg via TOPICAL

## 2016-03-05 MED ORDER — OXYCODONE HCL 5 MG PO TABS
ORAL_TABLET | ORAL | Status: AC
  Filled 2016-03-05: qty 1

## 2016-03-05 MED ORDER — ALBUMIN HUMAN 5 % IV SOLN
INTRAVENOUS | Status: DC | PRN
  Administered 2016-03-05: 09:00:00 via INTRAVENOUS

## 2016-03-05 MED ORDER — DOCUSATE SODIUM 100 MG PO CAPS
100.0000 mg | ORAL_CAPSULE | Freq: Two times a day (BID) | ORAL | Status: DC
  Administered 2016-03-05 – 2016-03-07 (×5): 100 mg via ORAL
  Filled 2016-03-05 (×5): qty 1

## 2016-03-05 MED ORDER — POLYETHYLENE GLYCOL 3350 17 G PO PACK: 17.0000 g | PACK | Freq: Every day | ORAL | Status: DC | PRN

## 2016-03-05 MED ORDER — CHLORHEXIDINE GLUCONATE 4 % EX LIQD: 60.0000 mL | Freq: Once | CUTANEOUS | Status: DC

## 2016-03-05 MED ORDER — FENTANYL CITRATE (PF) 100 MCG/2ML IJ SOLN
INTRAMUSCULAR | Status: AC
  Filled 2016-03-05: qty 2

## 2016-03-05 MED ORDER — ALUM & MAG HYDROXIDE-SIMETH 200-200-20 MG/5ML PO SUSP: 30.0000 mL | ORAL | Status: DC | PRN

## 2016-03-05 MED ORDER — HYDROCORTISONE NA SUCCINATE PF 100 MG IJ SOLR
100.0000 mg | Freq: Every day | INTRAMUSCULAR | Status: AC
  Administered 2016-03-05: 100 mg via INTRAVENOUS
  Filled 2016-03-05: qty 2

## 2016-03-05 MED ORDER — ARTIFICIAL TEARS OP OINT
TOPICAL_OINTMENT | OPHTHALMIC | Status: DC | PRN
  Administered 2016-03-05: 1 via OPHTHALMIC

## 2016-03-05 MED ORDER — SODIUM CHLORIDE 0.9 % IR SOLN
Status: DC | PRN
  Administered 2016-03-05: 3000 mL

## 2016-03-05 MED ORDER — PHENOL 1.4 % MT LIQD: 1.0000 | OROMUCOSAL | Status: DC | PRN

## 2016-03-05 MED ORDER — CEFAZOLIN SODIUM-DEXTROSE 2-4 GM/100ML-% IV SOLN
2.0000 g | Freq: Four times a day (QID) | INTRAVENOUS | Status: AC
  Administered 2016-03-05 (×2): 2 g via INTRAVENOUS
  Filled 2016-03-05 (×2): qty 100

## 2016-03-05 MED ORDER — METOCLOPRAMIDE HCL 5 MG/ML IJ SOLN: 5.0000 mg | Freq: Three times a day (TID) | INTRAMUSCULAR | Status: DC | PRN

## 2016-03-05 MED ORDER — ONDANSETRON HCL 4 MG/2ML IJ SOLN: 4.0000 mg | Freq: Four times a day (QID) | INTRAMUSCULAR | Status: DC | PRN

## 2016-03-05 MED ORDER — SODIUM CHLORIDE 0.9 % IV SOLN
75.0000 mL/h | INTRAVENOUS | Status: DC
  Administered 2016-03-06: 75 mL/h via INTRAVENOUS

## 2016-03-05 MED ORDER — LIDOCAINE HCL (CARDIAC) 20 MG/ML IV SOLN
INTRAVENOUS | Status: DC | PRN
  Administered 2016-03-05: 100 mg via INTRAVENOUS

## 2016-03-05 MED ORDER — FENTANYL CITRATE (PF) 100 MCG/2ML IJ SOLN
INTRAMUSCULAR | Status: DC | PRN
  Administered 2016-03-05 (×6): 50 ug via INTRAVENOUS

## 2016-03-05 MED ORDER — ONDANSETRON HCL 4 MG/2ML IJ SOLN
INTRAMUSCULAR | Status: AC
  Filled 2016-03-05: qty 2

## 2016-03-05 MED ORDER — 0.9 % SODIUM CHLORIDE (POUR BTL) OPTIME
TOPICAL | Status: DC | PRN
  Administered 2016-03-05: 1000 mL

## 2016-03-05 MED ORDER — MAGNESIUM CITRATE PO SOLN: 1.0000 | Freq: Once | ORAL | Status: DC | PRN

## 2016-03-05 MED ORDER — ROCURONIUM BROMIDE 50 MG/5ML IV SOSY
PREFILLED_SYRINGE | INTRAVENOUS | Status: AC
  Filled 2016-03-05: qty 5

## 2016-03-05 MED ORDER — METOCLOPRAMIDE HCL 5 MG/ML IJ SOLN
INTRAMUSCULAR | Status: AC
  Filled 2016-03-05: qty 2

## 2016-03-05 MED ORDER — BISACODYL 10 MG RE SUPP: 10.0000 mg | Freq: Every day | RECTAL | Status: DC | PRN

## 2016-03-05 MED ORDER — HYDROMORPHONE HCL 2 MG/ML IJ SOLN
0.5000 mg | INTRAMUSCULAR | Status: DC | PRN
Start: 2016-03-05 — End: 2016-03-07
  Administered 2016-03-05 – 2016-03-07 (×7): 1 mg via INTRAVENOUS
  Filled 2016-03-05 (×7): qty 1

## 2016-03-05 MED ORDER — DIPHENHYDRAMINE HCL 12.5 MG/5ML PO ELIX
12.5000 mg | ORAL_SOLUTION | ORAL | Status: DC | PRN
  Administered 2016-03-06 (×3): 25 mg via ORAL
  Filled 2016-03-05 (×3): qty 10

## 2016-03-05 MED ORDER — SODIUM CHLORIDE 0.9 % IV SOLN
75.0000 mL/h | INTRAVENOUS | Status: DC
  Administered 2016-03-05: 75 mL/h via INTRAVENOUS

## 2016-03-05 MED ORDER — OXYCODONE HCL 5 MG PO TABS
5.0000 mg | ORAL_TABLET | ORAL | Status: DC | PRN
  Administered 2016-03-05: 10 mg via ORAL
  Administered 2016-03-05: 5 mg via ORAL
  Administered 2016-03-06 – 2016-03-07 (×7): 10 mg via ORAL
  Filled 2016-03-05 (×9): qty 2

## 2016-03-05 MED ORDER — METHOCARBAMOL 500 MG PO TABS
500.0000 mg | ORAL_TABLET | Freq: Four times a day (QID) | ORAL | Status: DC | PRN
  Administered 2016-03-05 – 2016-03-07 (×7): 500 mg via ORAL
  Filled 2016-03-05 (×8): qty 1

## 2016-03-05 MED ORDER — ROCURONIUM BROMIDE 100 MG/10ML IV SOLN
INTRAVENOUS | Status: DC | PRN
  Administered 2016-03-05: 50 mg via INTRAVENOUS

## 2016-03-05 MED ORDER — ARTIFICIAL TEARS OP OINT
TOPICAL_OINTMENT | OPHTHALMIC | Status: AC
  Filled 2016-03-05: qty 3.5

## 2016-03-05 MED ORDER — RIVAROXABAN 10 MG PO TABS
10.0000 mg | ORAL_TABLET | Freq: Every day | ORAL | Status: DC
  Administered 2016-03-06 – 2016-03-07 (×2): 10 mg via ORAL
  Filled 2016-03-05 (×2): qty 1

## 2016-03-05 MED ORDER — NORGESTIM-ETH ESTRAD TRIPHASIC 0.18/0.215/0.25 MG-25 MCG PO TABS
1.0000 | ORAL_TABLET | Freq: Every day | ORAL | Status: DC
  Administered 2016-03-05 – 2016-03-06 (×2): 1 via ORAL

## 2016-03-05 MED ORDER — PROPOFOL 10 MG/ML IV BOLUS
INTRAVENOUS | Status: DC | PRN
  Administered 2016-03-05: 200 mg via INTRAVENOUS

## 2016-03-05 MED ORDER — ONDANSETRON HCL 4 MG/2ML IJ SOLN
INTRAMUSCULAR | Status: DC | PRN
  Administered 2016-03-05: 4 mg via INTRAVENOUS

## 2016-03-05 MED ORDER — METHOCARBAMOL 500 MG PO TABS
ORAL_TABLET | ORAL | Status: AC
  Filled 2016-03-05: qty 1

## 2016-03-05 MED ORDER — SUGAMMADEX SODIUM 200 MG/2ML IV SOLN
INTRAVENOUS | Status: DC | PRN
  Administered 2016-03-05: 200 mg via INTRAVENOUS

## 2016-03-05 MED ORDER — CEFAZOLIN SODIUM-DEXTROSE 2-4 GM/100ML-% IV SOLN
2.0000 g | INTRAVENOUS | Status: AC
  Administered 2016-03-05: 2 g via INTRAVENOUS

## 2016-03-05 MED ORDER — DEXAMETHASONE SODIUM PHOSPHATE 10 MG/ML IJ SOLN
INTRAMUSCULAR | Status: AC
  Filled 2016-03-05: qty 1

## 2016-03-05 MED ORDER — VECURONIUM BROMIDE 10 MG IV SOLR
INTRAVENOUS | Status: DC | PRN
  Administered 2016-03-05: 3 mg via INTRAVENOUS

## 2016-03-05 MED ORDER — HYDROCORTISONE NA SUCCINATE PF 100 MG IJ SOLR
50.0000 mg | Freq: Every day | INTRAMUSCULAR | Status: AC
  Administered 2016-03-05: 50 mg via INTRAVENOUS
  Filled 2016-03-05: qty 1

## 2016-03-05 MED ORDER — BUPIVACAINE HCL (PF) 0.25 % IJ SOLN
INTRAMUSCULAR | Status: DC | PRN
  Administered 2016-03-05: 30 mL

## 2016-03-05 MED ORDER — ACETAMINOPHEN 10 MG/ML IV SOLN
1000.0000 mg | Freq: Four times a day (QID) | INTRAVENOUS | Status: AC
  Administered 2016-03-05 – 2016-03-06 (×4): 1000 mg via INTRAVENOUS
  Filled 2016-03-05 (×3): qty 100

## 2016-03-05 MED ORDER — HYDROMORPHONE HCL 1 MG/ML IJ SOLN
0.2500 mg | INTRAMUSCULAR | Status: DC | PRN
  Administered 2016-03-05 (×2): 0.25 mg via INTRAVENOUS
  Administered 2016-03-05: 0.5 mg via INTRAVENOUS

## 2016-03-05 MED ORDER — MIDAZOLAM HCL 2 MG/2ML IJ SOLN
INTRAMUSCULAR | Status: AC
  Filled 2016-03-05: qty 2

## 2016-03-05 MED ORDER — METOCLOPRAMIDE HCL 5 MG PO TABS: 5.0000 mg | ORAL_TABLET | Freq: Three times a day (TID) | ORAL | Status: DC | PRN

## 2016-03-05 MED ORDER — ACETAMINOPHEN 10 MG/ML IV SOLN
INTRAVENOUS | Status: AC
  Filled 2016-03-05: qty 100

## 2016-03-05 SURGICAL SUPPLY — 62 items
BAG DECANTER FOR FLEXI CONT (MISCELLANEOUS) ×2 IMPLANT
BLADE SAW SAG 73X25 THK (BLADE) ×1
BLADE SAW SGTL 73X25 THK (BLADE) ×1 IMPLANT
BRUSH FEMORAL CANAL (MISCELLANEOUS) IMPLANT
CAPT HIP TOTAL 2 ×2 IMPLANT
COVER SURGICAL LIGHT HANDLE (MISCELLANEOUS) ×2 IMPLANT
DECANTER SPIKE VIAL GLASS SM (MISCELLANEOUS) ×2 IMPLANT
DRAPE INCISE IOBAN 66X45 STRL (DRAPES) ×2 IMPLANT
DRAPE ORTHO SPLIT 77X108 STRL (DRAPES) ×2
DRAPE SURG ORHT 6 SPLT 77X108 (DRAPES) ×2 IMPLANT
DRSG AQUACEL AG ADV 3.5X10 (GAUZE/BANDAGES/DRESSINGS) ×2 IMPLANT
DRSG MEPILEX BORDER 4X12 (GAUZE/BANDAGES/DRESSINGS) ×2 IMPLANT
DURAPREP 26ML APPLICATOR (WOUND CARE) ×4 IMPLANT
ELECT BLADE 4.0 EZ CLEAN MEGAD (MISCELLANEOUS) ×4
ELECT BLADE 6.5 EXT (BLADE) IMPLANT
ELECT REM PT RETURN 9FT ADLT (ELECTROSURGICAL) ×2
ELECTRODE BLDE 4.0 EZ CLN MEGD (MISCELLANEOUS) ×2 IMPLANT
ELECTRODE REM PT RTRN 9FT ADLT (ELECTROSURGICAL) ×1 IMPLANT
EVACUATOR 1/8 PVC DRAIN (DRAIN) IMPLANT
FACESHIELD WRAPAROUND (MASK) ×10 IMPLANT
GLOVE BIOGEL PI IND STRL 8 (GLOVE) ×2 IMPLANT
GLOVE BIOGEL PI IND STRL 8.5 (GLOVE) ×1 IMPLANT
GLOVE BIOGEL PI INDICATOR 8 (GLOVE) ×2
GLOVE BIOGEL PI INDICATOR 8.5 (GLOVE) ×1
GLOVE ECLIPSE 8.0 STRL XLNG CF (GLOVE) ×4 IMPLANT
GLOVE SURG ORTHO 8.5 STRL (GLOVE) ×4 IMPLANT
GOWN STRL REUS W/ TWL LRG LVL3 (GOWN DISPOSABLE) ×2 IMPLANT
GOWN STRL REUS W/TWL 2XL LVL3 (GOWN DISPOSABLE) ×4 IMPLANT
GOWN STRL REUS W/TWL LRG LVL3 (GOWN DISPOSABLE) ×2
HANDPIECE INTERPULSE COAX TIP (DISPOSABLE)
IMMOBILIZER KNEE 19 UNV (ORTHOPEDIC SUPPLIES) ×2 IMPLANT
IMMOBILIZER KNEE 22 UNIV (SOFTGOODS) ×2 IMPLANT
KIT BASIN OR (CUSTOM PROCEDURE TRAY) ×2 IMPLANT
KIT ROOM TURNOVER OR (KITS) ×2 IMPLANT
MANIFOLD NEPTUNE II (INSTRUMENTS) ×2 IMPLANT
NEEDLE 22X1 1/2 (OR ONLY) (NEEDLE) ×4 IMPLANT
NS IRRIG 1000ML POUR BTL (IV SOLUTION) ×2 IMPLANT
PACK TOTAL JOINT (CUSTOM PROCEDURE TRAY) ×2 IMPLANT
PAD ARMBOARD 7.5X6 YLW CONV (MISCELLANEOUS) ×2 IMPLANT
PENCIL BUTTON HOLSTER BLD 10FT (ELECTRODE) ×2 IMPLANT
PRESSURIZER FEMORAL UNIV (MISCELLANEOUS) IMPLANT
SET HNDPC FAN SPRY TIP SCT (DISPOSABLE) IMPLANT
STAPLER VISISTAT 35W (STAPLE) ×2 IMPLANT
SUCTION FRAZIER HANDLE 10FR (MISCELLANEOUS) ×1
SUCTION TUBE FRAZIER 10FR DISP (MISCELLANEOUS) ×1 IMPLANT
SUT BONE WAX W31G (SUTURE) IMPLANT
SUT ETHIBOND NAB CT1 #1 30IN (SUTURE) ×6 IMPLANT
SUT MNCRL AB 3-0 PS2 18 (SUTURE) ×4 IMPLANT
SUT VIC AB 0 CT1 27 (SUTURE) ×2
SUT VIC AB 0 CT1 27XBRD ANBCTR (SUTURE) ×2 IMPLANT
SUT VIC AB 1 CT1 27 (SUTURE) ×3
SUT VIC AB 1 CT1 27XBRD ANBCTR (SUTURE) ×3 IMPLANT
SUT VIC AB 2-0 CT1 27 (SUTURE) ×1
SUT VIC AB 2-0 CT1 TAPERPNT 27 (SUTURE) ×1 IMPLANT
SYR CONTROL 10ML LL (SYRINGE) ×2 IMPLANT
TOWEL OR 17X24 6PK STRL BLUE (TOWEL DISPOSABLE) ×2 IMPLANT
TOWEL OR 17X26 10 PK STRL BLUE (TOWEL DISPOSABLE) ×2 IMPLANT
TOWER CARTRIDGE SMART MIX (DISPOSABLE) IMPLANT
TRAY CATH 16FR W/PLASTIC CATH (SET/KITS/TRAYS/PACK) IMPLANT
WATER STERILE IRR 1000ML POUR (IV SOLUTION) ×2 IMPLANT
WRAP KNEE MAXI GEL POST OP (GAUZE/BANDAGES/DRESSINGS) ×2 IMPLANT
YANKAUER SUCT BULB TIP NO VENT (SUCTIONS) ×4 IMPLANT

## 2016-03-05 NOTE — Anesthesia Procedure Notes (Signed)
Procedure Name: Intubation Date/Time: 03/05/2016 7:26 AM Performed by: Jacquiline Doe A Pre-anesthesia Checklist: Patient identified, Emergency Drugs available, Suction available and Patient being monitored Patient Re-evaluated:Patient Re-evaluated prior to inductionOxygen Delivery Method: Circle System Utilized and Circle system utilized Preoxygenation: Pre-oxygenation with 100% oxygen Intubation Type: IV induction Ventilation: Mask ventilation without difficulty and Oral airway inserted - appropriate to patient size Laryngoscope Size: Mac, 4 and Glidescope Grade View: Grade I Tube type: Oral Tube size: 7.5 mm Number of attempts: 2 (#3MAC Blade times one unsuccessful , then successful with glidescope  times one attempt .) Airway Equipment and Method: Video-laryngoscopy and Rigid stylet Placement Confirmation: ETT inserted through vocal cords under direct vision,  positive ETCO2 and breath sounds checked- equal and bilateral Secured at: 22 cm Tube secured with: Tape Dental Injury: Teeth and Oropharynx as per pre-operative assessment  Difficulty Due To: Difficulty was anticipated, Difficult Airway- due to reduced neck mobility and Difficult Airway- due to anterior larynx Comments: Recommend Glydescope .

## 2016-03-05 NOTE — Evaluation (Signed)
Physical Therapy Evaluation Patient Details Name: Rose Campbell MRN: [id number] DOB: 06-Dec-1985 Today's Date: 03/05/2016   History of Present Illness  Admitted for RTHA, Post Prec, WBAT;  has a past medical history of Arthritis; Depression; Joint pain; Joint swelling; Lupus; Polyarthralgia; and Seizures ([place]).  has a past surgical history that includes Total hip arthroplasty (Left, 08/03/2015).  Clinical Impression   Pt is s/p THA resulting in the deficits listed below (see PT Problem List).  Pt will benefit from skilled PT to increase their independence and safety with mobility to allow discharge to the venue listed below.      Follow Up Recommendations Home health PT;Supervision/Assistance - 24 hour    Equipment Recommendations  3in1 (PT)    Recommendations for Other Services OT consult     Precautions / Restrictions Precautions Precautions: Posterior Hip Precaution Booklet Issued: Yes (comment) Precaution Comments: Educated in Glass blower/designer; able to recall post prec from previous THA Restrictions Weight Bearing Restrictions: No      Mobility  Bed Mobility Overal bed mobility: Needs Assistance Bed Mobility: Supine to Sit     Supine to sit: Mod assist     General bed mobility comments: light mod assit to scoot hips to EOB; supported R hip coming off of bed  Transfers Overall transfer level: Needs assistance Equipment used: Rolling walker (2 wheeled) Transfers: Sit to/from Stand Sit to Stand: Min assist         General transfer comment: Cues for hand placement, prec and safety  Ambulation/Gait Ambulation/Gait assistance: Min guard Ambulation Distance (Feet):  (Pivotal steps bed to chair) Assistive device: Rolling walker (2 wheeled)       General Gait Details: Cues for rpecautions with turns  Financial trader Rankin (Stroke Patients Only)       Balance                                              Pertinent Vitals/Pain Pain Assessment: Faces Pain Score: 1  Faces Pain Scale: Hurts even more Pain Location: R hip with motion Pain Descriptors / Indicators: Grimacing;Guarding Pain Intervention(s): Limited activity within patient's tolerance;Monitored during session    Home Living Family/patient expects to be discharged to:: Private residence Living Arrangements: Other relatives Available Help at Discharge: Family;Available 24 hours/day Type of Home: House Home Access: Level entry     Home Layout: Two level;Bed/bath upstairs Home Equipment: Walker - 2 wheels;Bedside commode Additional Comments: has AE as well from previous surgery    Prior Function Level of Independence: Independent               Hand Dominance        Extremity/Trunk Assessment   Upper Extremity Assessment Upper Extremity Assessment: Overall WFL for tasks assessed    Lower Extremity Assessment Lower Extremity Assessment: RLE deficits/detail RLE Deficits / Details: Grossly decr hip A/AA/ROM, limited by pain postop       Communication   Communication: No difficulties  Cognition Arousal/Alertness: Awake/alert Behavior During Therapy: WFL for tasks assessed/performed Overall Cognitive Status: Within Functional Limits for tasks assessed                      General Comments      Exercises     Assessment/Plan    PT  Assessment Patient needs continued PT services  PT Problem List Decreased strength;Decreased range of motion;Decreased activity tolerance;Decreased balance;Decreased mobility;Decreased knowledge of use of DME;Pain          PT Treatment Interventions DME instruction;Gait training;Stair training;Functional mobility training;Therapeutic activities;Therapeutic exercise;Balance training;Patient/family education    PT Goals (Current goals can be found in the Care Plan section)  Acute Rehab PT Goals Patient Stated Goal: less pain PT Goal Formulation: With  patient Time For Goal Achievement: 03/12/16 Potential to Achieve Goals: Good    Frequency 7X/week   Barriers to discharge Inaccessible home environment Flight of steps to reach bedroom/bathroom    Co-evaluation               End of Session Equipment Utilized During Treatment: Gait belt Activity Tolerance: Patient tolerated treatment well Patient left: in chair;with call bell/phone within reach;with family/visitor present Nurse Communication: Mobility status         Time: [id number] PT Time Calculation (min) (ACUTE ONLY): 25 min   Charges:   PT Evaluation $PT Eval Low Complexity: 1 Procedure PT Treatments $Therapeutic Activity: 8-22 mins   PT G Codes:        Colletta Maryland 03/05/2016, 5:02 PM  Roney Marion, [place] Pager [phone] Office [phone]

## 2016-03-05 NOTE — H&P (Signed)
The recent History & Physical has been reviewed. I have personally examined the patient today. There is no interval change to the documented History & Physical. The patient would like to proceed with the procedure.  Garald Balding 03/05/2016,  7:12 AM

## 2016-03-05 NOTE — Anesthesia Preprocedure Evaluation (Addendum)
Anesthesia Evaluation  Patient identified by MRN, date of birth, ID band Patient awake    Reviewed: Allergy & Precautions, NPO status , Patient's Chart, lab work & pertinent test results  Airway Mallampati: II  TM Distance: >3 FB     Dental   Pulmonary neg pulmonary ROS,    breath sounds clear to auscultation       Cardiovascular negative cardio ROS   Rhythm:Regular Rate:Normal     Neuro/Psych Seizures -,     GI/Hepatic negative GI ROS, Neg liver ROS,   Endo/Other  negative endocrine ROSlupus  Renal/GU negative Renal ROS     Musculoskeletal  (+) Arthritis , Fibromyalgia -  Abdominal   Peds  Hematology   Anesthesia Other Findings   Reproductive/Obstetrics                            Anesthesia Physical Anesthesia Plan  ASA: III  Anesthesia Plan: General   Post-op Pain Management:    Induction: Intravenous  Airway Management Planned: Oral ETT  Additional Equipment:   Intra-op Plan:   Post-operative Plan: Extubation in OR  Informed Consent: I have reviewed the patients History and Physical, chart, labs and discussed the procedure including the risks, benefits and alternatives for the proposed anesthesia with the patient or authorized representative who has indicated his/her understanding and acceptance.   Dental advisory given  Plan Discussed with: CRNA and Anesthesiologist  Anesthesia Plan Comments:        Anesthesia Quick Evaluation

## 2016-03-05 NOTE — Op Note (Signed)
Rose Campbell, Rose Campbell              ACCOUNT NO.:  [id number]  MEDICAL RECORD NO.:  [id number]  LOCATION:                                 FACILITY:  PHYSICIAN:  Vonna Kotyk. Valery Chance, M.D.DATE OF BIRTH:  July 15, 1985  DATE OF PROCEDURE:  03/05/2016 DATE OF DISCHARGE:                              OPERATIVE REPORT   PREOPERATIVE DIAGNOSIS:  End-stage osteoarthritis, right hip.  POSTOPERATIVE DIAGNOSIS:  End-stage osteoarthritis, right hip.  PROCEDURE:  Right total hip replacement.  SURGEON:  Vonna Kotyk. Durward Fortes, M.D.  ASSISTANT:  Aaron Edelman D. Petrarca, P.A.-C.  ANESTHESIA:  General.  COMPLICATIONS:  None.  COMPONENTS:  DePuy AML 12 mm small stature femoral component, a ceramic hip ball 32 mm outer diameter, +5 neck length.  A 48 mm outer diameter Gription 3 acetabular component with an apex hole eliminator and a Marathon polyethylene liner +4 with a 10-degree posterior lip. Components were press-fit.  DESCRIPTION OF PROCEDURE:  Rose Campbell was met in the holding area accompanied by her mother.  Any questions were answered.  The right hip was identified as the appropriate operative site and marked accordingly.  The patient was then transported to room #7 and placed under general anesthesia without difficulty.  Nursing staff inserted a Foley catheter. Urine was clear.  We did obtain a specimen for culture and sensitivity as there were multiple species on the preoperative urinalysis.  The patient was then placed in the lateral decubitus position with the right side up and secured to the operating room table with the Innomed hip system.  The right lower extremity was then prepped with chlorhexidine scrub and DuraPrep from the iliac crest to the ankle. Sterile draping was performed.  Time-out was called.  A routine Southern incision was utilized and via sharp dissection, carried down to subcutaneous tissue.  There was abundant adipose tissue at least 3 inches in depth for identifying the  iliotibial band and gluteal muscles.  Self-retaining retractors were inserted.  Bleeding was controlled with the Bovie.  IT band including muscles were then opened bluntly.  Self-retaining retractor was inserted more deeply.  Short external rotators were identified.  Any tendinous structures were tagged with 0 Ethibond suture and then released from their posterior attachment.  The hip capsule was identified and incised along the femoral neck and head.  The joint was entered.  There was a clear yellow joint effusion.  The head was then dislocated posteriorly.  It was devoid predominantly of articular cartilage.  Using the calcar guide, the head was osteotomized and then removed from the wound, retractors were placed around the femur.  A starter hole was then made in the piriformis fossa.  Canal finder was then inserted and reaming was performed to 11.5 mm to accept a 12 mm component.  The 12 mm rasp was inserted.  I felt like the neck was still a bit too long, so I removed approximately 6 mm and then used a calcar reamer to obtain the appropriate angle.  There was a small crack in the calcar, which did not propagate.  The retractors were then placed around the acetabulum.  Labrum was sharply excised.  Soft tissue was removed from the acetabulum.  Reaming was performed sequentially to 47 mm to accept a 48 mm component.  I trialed a 48 component would not completely seat, but had good rim fit.  The final Gription 3, 48 mm outer diameter metallic acetabulum was then impacted, was nice and tight.  I thought that it was completely seated. The wound was irrigated.  The trial Marathon poly liner was then screwed in place.  The 12 mm small stature femoral component was then impacted on the calcar.  We trailed several neck lengths and felt like the +5 was the more appropriate.  This was reduced and through a full range of motion, there was absolutely no stability, no toggle and leg lengths  remained equal.  Trial components were then removed.  The joint was copiously irrigated with saline solution.  Apex hole eliminator was inserted, followed by the final Marathon polyethylene liner.  Retractors were placed around the femur, then impacted the 12 mm small stature femoral component, which sat nicely on the calcar.  Again, there was no motion in the longitudinal fracture at the calcar.  We cleaned the [facility] taper neck, made sure the acetabulum was clear of any loose material and applied the ceramic 32 mm outer diameter hip ball with a +5 neck length.  We cleaned the acetabulum again, inspected, irrigated, then reduced the hip through a full range of motion, remained perfectly stable.  The capsule was then closed anatomically with #1 Ethibond.  The short external rotators were closed with a similar material.  The wound was irrigated with saline solution and we injected the deep capsule with 0.25% Marcaine without epinephrine.  We applied topical tranexamic acid.  The IT band gluteal muscles were then closed with running #1 Vicryl subcu in several layers with 2-0 Vicryl and 3-0 Monocryl.  Skin closed with skin clips.  Tranexamic acid was placed more superficially as well topically.  The patient tolerated procedure without complications.  Technically, the procedure was difficult based on the patient's size.  The patient was then awoken, returned to the postanesthesia recovery room.  Knee immobilizer applied to the right lower extremity.     Vonna Kotyk. Durward Fortes, M.D.   ______________________________ Vonna Kotyk. Durward Fortes, M.D.    PWW/MEDQ  D:  03/05/2016  T:  03/05/2016  Job:  [id number]

## 2016-03-05 NOTE — Op Note (Signed)
PATIENT ID:      Rose Campbell  MRN:     [id number] DOB/AGE:    [birth year] / 31 y.o.       OPERATIVE REPORT    DATE OF PROCEDURE:  03/05/2016       PREOPERATIVE DIAGNOSIS:END STAGE   OSTEOARTHRITIS OF THE RIGHT HIP                                                       Estimated body mass index is 30.23 kg/m as calculated from the following:   Height as of 02/26/16: 5\' 1"  (1.549 m).   Weight as of this encounter: 160 lb (72.6 kg).     POSTOPERATIVE DIAGNOSIS: END STAGE  OSTEOARTHRITIS OF THE RIGHT HIP                                                                     Estimated body mass index is 30.23 kg/m as calculated from the following:   Height as of 02/26/16: 5\' 1"  (1.549 m).   Weight as of this encounter: 160 lb (72.6 kg).     PROCEDURE:  Procedure(s):RIGHT TOTAL HIP ARTHROPLASTY      SURGEON:  Joni Fears, MD    ASSISTANT:   Biagio Borg, PA-C   (Present and scrubbed throughout the case, critical for assistance with exposure, retraction, instrumentation, and closure.)          ANESTHESIA: general     DRAINS: none :      TOURNIQUET TIME: * No tourniquets in log *    COMPLICATIONS:  None   CONDITION:  stable  PROCEDURE IN DETAIL: [place] 03/05/2016, 9:45 AM

## 2016-03-05 NOTE — Transfer of Care (Signed)
Immediate Anesthesia Transfer of Care Note  Patient: Shaniya Deol  Procedure(s) Performed: Procedure(s): TOTAL HIP ARTHROPLASTY (Right)  Patient Location: PACU  Anesthesia Type:General  Level of Consciousness: awake, oriented, sedated, patient cooperative and responds to stimulation  Airway & Oxygen Therapy: Patient Spontanous Breathing and Patient connected to nasal cannula oxygen  Post-op Assessment: Report given to RN, Post -op Vital signs reviewed and stable, Patient moving all extremities and Patient moving all extremities X 4  Post vital signs: Reviewed and stable  Last Vitals:  Vitals:   03/05/16 0623  BP: (!) 139/101  Pulse: 89  Resp: 20  Temp: 37.2 C    Last Pain:  Vitals:   03/05/16 0623  TempSrc: Oral  PainSc:       Patients Stated Pain Goal: 3 ([id number])  Complications: No apparent anesthesia complications

## 2016-03-05 NOTE — Anesthesia Postprocedure Evaluation (Signed)
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(s) Performed: Procedure(s) (LRB): TOTAL HIP ARTHROPLASTY (Right)  Patient location during evaluation: PACU Anesthesia Type: General Level of consciousness: awake Pain management: pain level controlled Respiratory status: spontaneous breathing Cardiovascular status: stable Anesthetic complications: no       Last Vitals:  Vitals:   03/05/16 1112 03/05/16 1123  BP:    Pulse: (!) 102 (!) 102  Resp: (!) 21 14  Temp:  36.2 C    Last Pain:  Vitals:   03/05/16 1123  TempSrc:   PainSc: 5                  Rose Campbell

## 2016-03-06 ENCOUNTER — Encounter (HOSPITAL_COMMUNITY): Payer: Self-pay | Admitting: Orthopaedic Surgery

## 2016-03-06 DIAGNOSIS — M1611 Unilateral primary osteoarthritis, right hip: Principal | ICD-10-CM

## 2016-03-06 LAB — CBC
HCT: 26.5 % — ABNORMAL LOW (ref 36.0–46.0)
Hemoglobin: 8.1 g/dL — ABNORMAL LOW (ref 12.0–15.0)
MCH: 25.6 pg — ABNORMAL LOW (ref 26.0–34.0)
MCHC: 30.6 g/dL (ref 30.0–36.0)
MCV: 83.6 fL (ref 78.0–100.0)
Platelets: 545 10*3/uL — ABNORMAL HIGH (ref 150–400)
RBC: 3.17 MIL/uL — ABNORMAL LOW (ref 3.87–5.11)
RDW: 16.5 % — ABNORMAL HIGH (ref 11.5–15.5)
WBC: 14.1 10*3/uL — AB (ref 4.0–10.5)

## 2016-03-06 LAB — BASIC METABOLIC PANEL
ANION GAP: 10 (ref 5–15)
BUN: 5 mg/dL — AB (ref 6–20)
CHLORIDE: 99 mmol/L — AB (ref 101–111)
CO2: 27 mmol/L (ref 22–32)
Calcium: 8.2 mg/dL — ABNORMAL LOW (ref 8.9–10.3)
Creatinine, Ser: 0.65 mg/dL (ref 0.44–1.00)
GFR calc Af Amer: >60 mL/min (ref >60)
GFR calc non Af Amer: >60 mL/min (ref >60)
Glucose, Bld: 160 mg/dL — ABNORMAL HIGH (ref 65–99)
POTASSIUM: 4 mmol/L (ref 3.5–5.1)
Sodium: 136 mmol/L (ref 135–145)

## 2016-03-06 LAB — URINE CULTURE: Culture: NO GROWTH

## 2016-03-06 NOTE — Care Management Note (Signed)
Case Management Note  Patient Details  Name: Rose Campbell MRN: [id number] Date of Birth: 05-08-1985  Subjective/Objective:   31 yr old young lady s/p right total hip arthroplasty.         Action/Plan: Case manager spke with patient and her mom concerning discharge plan and DME needs. Patient was preoperatively setup with [facility], no changes. Patient will need 3in1. She will have family support at discharge.   Expected Discharge Date:   03/07/16               Expected Discharge Plan:  [place]  In-House Referral:  NA  Discharge planning Services  CM Consult  Post Acute Care Choice:  Durable Medical Equipment, Home Health Choice offered to:  Patient  DME Arranged:  3-N-1 DME Agency:  [place]:  PT [place]:  [facility] (formerly [facility]  Status of Service:  Completed, signed off  If discussed at [employer] of Stay Meetings, dates discussed:    Additional Comments:  Ninfa Meeker, RN 03/06/2016, 2:09 PM

## 2016-03-06 NOTE — Op Note (Signed)
PATIENT ID: Rose Campbell        MRN:  [id number]          DOB/AGE: [birth year] / 30 y.o.    Joni Fears, MD   Biagio Borg, PA-C [address] [place], [place]  [address]                             [phone]   PROGRESS NOTE  Subjective:  negative for Chest Pain  negative for Shortness of Breath  negative for Nausea/Vomiting   negative for Calf Pain    Tolerating Diet: yes         Patient reports pain as mild.     No complaints this am  Objective: Vital signs in last 24 hours:   Patient Vitals for the past 24 hrs:  BP Temp Temp src Pulse Resp SpO2  03/06/16 0400 (!) 132/95 98.8 F (37.1 C) Oral (!) 105 - 99 %  03/05/16 2000 132/85 97.5 F (36.4 C) - 100 - 100 %  03/05/16 1123 - 97.2 F (36.2 C) - (!) 102 14 100 %  03/05/16 1112 - - - (!) 102 (!) 21 100 %  03/05/16 1102 135/90 - - - - -  03/05/16 1057 - - - (!) 103 17 100 %  03/05/16 1041 - - - (!) 101 19 100 %  03/05/16 1023 - - - (!) 101 (!) 24 100 %  03/05/16 1017 - 97.5 F (36.4 C) - - - -      Intake/Output from previous day:   02/13 0701 - 02/14 0700 In: 2450 [P.O.:300; I.V.:1900] Out: 1250 [Urine:950]   Intake/Output this shift:   No intake/output data recorded.   Intake/Output      02/13 0701 - 02/14 0700 02/14 0701 - 02/15 0700   P.O. 300    I.V. (mL/kg) 1900 (26.2)    IV Piggyback 250    Total Intake(mL/kg) 2450 (33.7)    Urine (mL/kg/hr) 950 (0.5)    Blood 300 (0.2)    Total Output 1250     Net +1200             LABORATORY DATA:  Recent Labs  03/06/16 0352  WBC 14.1*  HGB 8.1*  HCT 26.5*  PLT 545*    Recent Labs  03/06/16 0352  NA 136  K 4.0  CL 99*  CO2 27  BUN 5*  CREATININE 0.65  GLUCOSE 160*  CALCIUM 8.2*   Lab Results  Component Value Date   INR 1.03 02/26/2016   INR 1.11 07/21/2015    Recent Radiographic Studies :  Dg Hip Port Unilat With Pelvis 1v Right  Result Date: 03/05/2016 CLINICAL DATA:  Total hip arthroplasty. EXAM: DG HIP (WITH OR WITHOUT  PELVIS) 1V PORT RIGHT COMPARISON:  10/11/2015. FINDINGS: Total right hip replacement. Anatomic alignment. Hardware intact. Prior left hip replacement. No acute abnormality . IMPRESSION: Total right hip replacement.  Anatomic alignment.  Hardware intact. Electronically Signed   By: Marcello Moores  Register   On: 03/05/2016 11:01     Examination:  General appearance: alert, cooperative and no distress  Wound Exam: clean, dry, intact   Drainage:  None: wound tissue dry  Motor Exam: EHL, FHL, Anterior Tibial and Posterior Tibial Intact  Sensory Exam: Superficial Peroneal, Deep Peroneal and Tibial normal  Vascular Exam: Normal  Assessment:    1 Day Post-Op  Procedure(s) (LRB): TOTAL HIP ARTHROPLASTY (Right)  ADDITIONAL DIAGNOSIS:  Principal  Problem:   Unilateral primary osteoarthritis, right hip Active Problems:   S/P total hip arthroplasty  Acute Blood Loss Anemia-asymptomatic,monitor   Plan: Physical Therapy as ordered Weight Bearing as Tolerated (WBAT)  DVT Prophylaxis:  Xarelto, Foot Pumps and TED hose  DISCHARGE PLAN: Home  DISCHARGE NEEDS: HHPT, Walker and 3-in-1 comode seat   OOB with PT, foley out this am, saline lock IV     Garald Balding  03/06/2016 7:31 AM

## 2016-03-06 NOTE — Progress Notes (Signed)
Physical Therapy Treatment Patient Details Name: Rose Campbell MRN: [id number] DOB: 15-Oct-1985 Today's Date: 03/06/2016    History of Present Illness Admitted for RTHA, Post Prec, WBAT;  has a past medical history of Arthritis; Depression; Joint pain; Joint swelling; Lupus; Polyarthralgia; and Seizures ([place]).  has a past surgical history that includes Total hip arthroplasty (Left, 08/03/2015).    PT Comments    Excellent progress towards goals; on track to dc tomorrow  Follow Up Recommendations  Home health PT;Supervision/Assistance - 24 hour     Equipment Recommendations  3in1 (PT)    Recommendations for Other Services OT consult     Precautions / Restrictions Precautions Precautions: Posterior Hip Precaution Booklet Issued: Yes (comment) Precaution Comments: Educated in Glass blower/designer; able to recall post prec from previous THA Restrictions RLE Weight Bearing: Weight bearing as tolerated    Mobility  Bed Mobility Overal bed mobility: Needs Assistance Bed Mobility: Sit to Supine       Sit to supine: Min guard   General bed mobility comments: Cues for technique; used handle end of cane to assist RLE into bed  Transfers Overall transfer level: Needs assistance Equipment used: Rolling walker (2 wheeled) Transfers: Sit to/from Stand Sit to Stand: Min guard         General transfer comment: Cues for hand placement, prec and safety  Ambulation/Gait Ambulation/Gait assistance: Min guard Ambulation Distance (Feet): 50 Feet Assistive device: Rolling walker (2 wheeled) Gait Pattern/deviations: Step-through pattern Gait velocity: slow   General Gait Details: Cues for pecautions with turns, to self-monitor for activity tolerance, and for upright posture   Stairs Stairs: Yes   Stair Management: No rails;Step to pattern;Backwards;With walker Number of Stairs: 12 General stair comments: Cues for technqiue; Sister provided correct assist  Wheelchair Mobility     Modified Rankin (Stroke Patients Only)       Balance                                    Cognition Arousal/Alertness: Awake/alert Behavior During Therapy: WFL for tasks assessed/performed Overall Cognitive Status: Within Functional Limits for tasks assessed                      Exercises Total Joint Exercises Quad Sets: AROM;Right;10 reps Gluteal Sets: AROM;Both;10 reps Towel Squeeze: AROM;Right;10 reps Heel Slides: AROM;Right;10 reps Hip ABduction/ADduction: AAROM;Right;10 reps    General Comments        Pertinent Vitals/Pain Pain Assessment: 0-10 Pain Score: 7  Pain Location: R hip Pain Descriptors / Indicators: Sore Pain Intervention(s): Monitored during session    Home Living                      Prior Function            PT Goals (current goals can now be found in the care plan section) Acute Rehab PT Goals Patient Stated Goal: be able to sit [place] style! PT Goal Formulation: With patient Time For Goal Achievement: 03/12/16 Potential to Achieve Goals: Good Progress towards PT goals: Progressing toward goals    Frequency    7X/week      PT Plan Current plan remains appropriate    Co-evaluation             End of Session Equipment Utilized During Treatment: Gait belt Activity Tolerance: Patient tolerated treatment well Patient left: in bed;with call bell/phone within reach;with family/visitor  present     Time: [id number] PT Time Calculation (min) (ACUTE ONLY): 37 min  Charges:  $Gait Training: 8-22 mins $Therapeutic Exercise: 8-22 mins                    G Codes:      Colletta Maryland Mar 29, 2016, 5:16 PM  Roney Marion, [place] Pager [phone] Office [phone]

## 2016-03-06 NOTE — Discharge Instructions (Addendum)

## 2016-03-06 NOTE — Evaluation (Signed)
Occupational Therapy Evaluation Patient Details Name: Rose Campbell MRN: [id number] DOB: May 13, 1985 Today's Date: 03/06/2016    History of Present Illness Admitted for RTHA, Post Prec, WBAT;  has a past medical history of Arthritis; Depression; Joint pain; Joint swelling; Lupus; Polyarthralgia; and Seizures ([place]).  has a past surgical history that includes Total hip arthroplasty (Left, 08/03/2015).   Clinical Impression   Pt is s/p THA- Posterior- resulting in the deficits listed below (see OT Problem List). Pt will benefit from skilled OT to increase their safety and independence with ADL and functional mobility for ADL to facilitate discharge to venue listed below.      Follow Up Recommendations  No OT follow up          Precautions / Restrictions Precautions Precautions: Posterior Hip Precaution Booklet Issued: Yes (comment) Precaution Comments: Educated in Glass blower/designer; able to recall post prec from previous THA Restrictions RLE Weight Bearing: Weight bearing as tolerated      Mobility Bed Mobility         min A with supine to sit          Transfers Overall transfer level: Needs assistance Equipment used: Rolling walker (2 wheeled) Transfers: Sit to/from Stand Sit to Stand: Min guard (without physical contact)         General transfer comment: Cues for hand placement, prec and safety         ADL Overall ADL's : Needs assistance/impaired                 Upper Body Dressing : Set up;Sitting   Lower Body Dressing: Moderate assistance;Sit to/from stand;Cueing for sequencing;Cueing for compensatory techniques;Cueing for safety;Adhering to hip precautions   Toilet Transfer: RW;Ambulation;Cueing for sequencing;Cueing for safety;Minimal assistance Toilet Transfer Details (indicate cue type and reason): bed to chair Toileting- Clothing Manipulation and Hygiene: Minimal assistance;Sit to/from stand;Cueing for safety;Cueing for sequencing;Adhering to hip  precautions         General ADL Comments: Pt has AE at home. Pt does state she needs a 3 n 1. Mother will A as needed. Pt aware of posterior hip precautions.                 Pertinent Vitals/Pain Pain Assessment: 0-10 Pain Score: 7  Pain Location: R hip Pain Descriptors / Indicators: Sore Pain Intervention(s): Monitored during session;Premedicated before session           Communication     Cognition Arousal/Alertness: Awake/alert Behavior During Therapy: WFL for tasks assessed/performed Overall Cognitive Status: Within Functional Limits for tasks assessed                                            OT Problem List: Decreased strength;Decreased activity tolerance;Pain   OT Treatment/Interventions: Self-care/ADL training;DME and/or AE instruction;Patient/family education    OT Goals(Current goals can be found in the care plan section) Acute Rehab OT Goals Patient Stated Goal: be able to sit [place] style! ADL Goals Pt Will Perform Lower Body Dressing: with supervision;sit to/from stand Pt Will Transfer to Toilet: with supervision;ambulating;regular height toilet Pt Will Perform Toileting - Clothing Manipulation and hygiene: with supervision;sit to/from stand Pt Will Perform Tub/Shower Transfer: with set-up;Shower transfer;3 in 1  OT Frequency: Min 2X/week              End of Session    Patient left:  in chair  with call bell and mother present   Time: [id number] OT Time Calculation (min): 19 min Charges:  OT General Charges $OT Visit: 1 Procedure OT Evaluation $OT Eval Moderate Complexity: 1 Procedure G-Codes:    Betsy Pries Mar 29, 2016, 2:33 PM

## 2016-03-06 NOTE — Progress Notes (Signed)
Physical Therapy Treatment Patient Details Name: Rose Campbell MRN: [id number] DOB: 1985-09-08 Today's Date: 03/06/2016    History of Present Illness Admitted for RTHA, Post Prec, WBAT;  has a past medical history of Arthritis; Depression; Joint pain; Joint swelling; Lupus; Polyarthralgia; and Seizures ([place]).  has a past surgical history that includes Total hip arthroplasty (Left, 08/03/2015).    PT Comments    Excellent progress with progressive amb and activity tolerance; Plan for stair training and focus on therex this PM; on track for dc home tomorrow  Follow Up Recommendations  Home health PT;Supervision/Assistance - 24 hour     Equipment Recommendations  3in1 (PT)    Recommendations for Other Services       Precautions / Restrictions Precautions Precautions: Posterior Hip Precaution Booklet Issued: Yes (comment) Precaution Comments: Educated in Rose Campbell; able to recall post prec from previous THA Restrictions Weight Bearing Restrictions: No RLE Weight Bearing: Weight bearing as tolerated    Mobility  Bed Mobility Overal bed mobility: Needs Assistance Bed Mobility: Supine to Sit     Supine to sit: Min assist        Transfers Overall transfer level: Needs assistance Equipment used: Rolling walker (2 wheeled) Transfers: Sit to/from Stand Sit to Stand: Min guard (without physical contact)         General transfer comment: Cues for hand placement, prec and safety  Ambulation/Gait Ambulation/Gait assistance: Min guard Ambulation Distance (Feet): 100 Feet Assistive device: Rolling walker (2 wheeled) Gait Pattern/deviations: Step-through pattern Gait velocity: slow   General Gait Details: Cues for pecautions with turns, to self-monitor for activity tolerance, and for upright posture   Stairs            Wheelchair Mobility    Modified Rankin (Stroke Patients Only)       Balance                                    Cognition  Arousal/Alertness: Awake/alert Behavior During Therapy: WFL for tasks assessed/performed Overall Cognitive Status: Within Functional Limits for tasks assessed                      Exercises      General Comments        Pertinent Vitals/Pain Pain Assessment: 0-10 Pain Score: 7  Pain Location: R hip Pain Descriptors / Indicators: Sore Pain Intervention(s): Monitored during session;Premedicated before session    Home Living Family/patient expects to be discharged to:: Private residence Living Arrangements: Other relatives Available Help at Discharge: Family;Available 24 hours/day Type of Home: House Home Access: Level entry   Home Layout: Two level;Bed/bath upstairs Home Equipment: Walker - 2 wheels;Bedside commode Additional Comments: has AE as well from previous surgery    Prior Function Level of Independence: Independent          PT Goals (current goals can now be found in the care plan section) Acute Rehab PT Goals Patient Stated Goal: be able to sit [place] style! PT Goal Formulation: With patient Time For Goal Achievement: 03/12/16 Potential to Achieve Goals: Good Progress towards PT goals: Progressing toward goals    Frequency    7X/week      PT Plan Current plan remains appropriate    Co-evaluation             End of Session Equipment Utilized During Treatment: Gait belt Activity Tolerance: Patient tolerated treatment well Patient left:  in chair;with call bell/phone within reach;with family/visitor present     Time: 1101-1130 PT Time Calculation (min) (ACUTE ONLY): 29 min  Charges:  $Gait Training: 23-37 mins                    G Codes:      Colletta Maryland 2016-03-08, 12:52 PM  Roney Marion, [place] Pager [phone] Office [phone]

## 2016-03-07 LAB — BASIC METABOLIC PANEL
ANION GAP: 6 (ref 5–15)
BUN: 5 mg/dL — ABNORMAL LOW (ref 6–20)
CHLORIDE: 103 mmol/L (ref 101–111)
CO2: 30 mmol/L (ref 22–32)
Calcium: 7.8 mg/dL — ABNORMAL LOW (ref 8.9–10.3)
Creatinine, Ser: 0.53 mg/dL (ref 0.44–1.00)
GFR calc Af Amer: >60 mL/min (ref >60)
Glucose, Bld: 100 mg/dL — ABNORMAL HIGH (ref 65–99)
POTASSIUM: 3 mmol/L — AB (ref 3.5–5.1)
Sodium: 139 mmol/L (ref 135–145)

## 2016-03-07 LAB — CBC
HEMATOCRIT: 21.7 % — AB (ref 36.0–46.0)
HEMOGLOBIN: 6.6 g/dL — AB (ref 12.0–15.0)
MCH: 25.5 pg — ABNORMAL LOW (ref 26.0–34.0)
MCHC: 30.4 g/dL (ref 30.0–36.0)
MCV: 83.8 fL (ref 78.0–100.0)
PLATELETS: 485 10*3/uL — AB (ref 150–400)
RBC: 2.59 MIL/uL — AB (ref 3.87–5.11)
RDW: 16.4 % — ABNORMAL HIGH (ref 11.5–15.5)
WBC: 12.3 10*3/uL — AB (ref 4.0–10.5)

## 2016-03-07 LAB — HEMOGLOBIN AND HEMATOCRIT, BLOOD
HEMATOCRIT: 31.1 % — AB (ref 36.0–46.0)
Hemoglobin: 9.7 g/dL — ABNORMAL LOW (ref 12.0–15.0)

## 2016-03-07 LAB — PREPARE RBC (CROSSMATCH)

## 2016-03-07 MED ORDER — RIVAROXABAN 10 MG PO TABS: 10.0000 mg | ORAL_TABLET | Freq: Every day | ORAL | 0 refills | Status: DC

## 2016-03-07 MED ORDER — PREDNISONE 10 MG PO TABS: 10.0000 mg | ORAL_TABLET | Freq: Every day | ORAL | 0 refills | Status: DC

## 2016-03-07 MED ORDER — OXYCODONE HCL 5 MG PO TABS: 5.0000 mg | ORAL_TABLET | ORAL | 0 refills | Status: DC | PRN

## 2016-03-07 MED ORDER — METHOCARBAMOL 500 MG PO TABS: 500.0000 mg | ORAL_TABLET | Freq: Four times a day (QID) | ORAL | 0 refills | Status: DC | PRN

## 2016-03-07 MED ORDER — SODIUM CHLORIDE 0.9 % IV SOLN: Freq: Once | INTRAVENOUS | Status: DC

## 2016-03-07 NOTE — Progress Notes (Signed)
CRITICAL VALUE ALERT  Critical value received:  Hemoglobin 6.6  Date of notification:  03/07/16  Time of notification:  [id number]   Critical value read back:Yes.    Nurse who received alert:  Michele Rockers*  MD notified (1st page):  Doc on Call for [facility]  Time of first page:  0506  MD notified (2nd page):Doc on Call for [facility]  Time of second page:0530  Responding MD:  Dr. Erlinda Hong   Time MD responded:  [phone]

## 2016-03-07 NOTE — Progress Notes (Signed)
Paged/called doc on call around 5:05 am for Dr Durward Fortes with regards to patient's hemoglobin 6.6; have not receive a call back yet as of this time

## 2016-03-07 NOTE — Progress Notes (Signed)
Occupational Therapy Treatment Patient Details Name: Rose Campbell MRN: [id number] DOB: 1985/03/08 Today's Date: 03/07/2016    History of present illness Admitted for RTHA, [place], WBAT;  has a past medical history of Arthritis; Depression; Joint pain; Joint swelling; Lupus; Polyarthralgia; and Seizures ([place]).  has a past surgical history that includes Total hip arthroplasty (Left, 08/03/2015).   OT comments  Pt making progress towards OT goals this session. Pt able to ambulate to bathroom, perform bed mobility, transfers, peri care and sink level grooming while maintaining precautions. Tub transfer with 3 in 1 demonstrated for family, and they were provided with handout to assist at home as well. Pt and family with no questions or concerns at the end of the session.   Follow Up Recommendations  No OT follow up    Equipment Recommendations  3 in 1 bedside commode ((in room))    Recommendations for Other Services      Precautions / Restrictions Precautions Precautions: Posterior Hip Precaution Booklet Issued: Yes (comment) Precaution Comments: Pt able to recall 3/3 precautions, handout hanging on door Restrictions Weight Bearing Restrictions: Yes RLE Weight Bearing: Weight bearing as tolerated       Mobility Bed Mobility Overal bed mobility: Needs Assistance Bed Mobility: Sit to Supine;Supine to Sit     Supine to sit: Min assist Sit to supine: Min guard   General bed mobility comments: Min A for RLE to EOB, min guard for RLE back into bed - Pt using OT gait belt as assistive device  Transfers Overall transfer level: Needs assistance Equipment used: Rolling walker (2 wheeled) Transfers: Sit to/from Stand Sit to Stand: Min guard         General transfer comment: Cues for hand placement, prec and safety    Balance Overall balance assessment: Needs assistance Sitting-balance support: No upper extremity supported;Feet supported Sitting balance-Leahy Scale: Good     Standing balance support: Bilateral upper extremity supported;During functional activity;No upper extremity supported Standing balance-Leahy Scale: Fair Standing balance comment: able to perform sink level grooming                   ADL Overall ADL's : Needs assistance/impaired     Grooming: Wash/dry hands;Supervision/safety;Standing Grooming Details (indicate cue type and reason): sink level                 Toilet Transfer: Min guard;Cueing for sequencing;Ambulation;BSC;RW Toilet Transfer Details (indicate cue type and reason): BSC over comfort height toilet Toileting- Clothing Manipulation and Hygiene: Supervision/safety;Sit to/from stand Toileting - Clothing Manipulation Details (indicate cue type and reason): able to perform peri care without assist, maintaining precautions Tub/ Shower Transfer: Tub transfer;Minimal assistance;With caregiver independent assisting;Anterior/posterior;3 in Mudlogger Details (indicate cue type and reason): handout provided, demonstration from OT for Pt and family Functional mobility during ADLs: Supervision/safety;Rolling walker General ADL Comments: Pt has been using AE at home, 3 in 1 in room, and Pt's mom and sister state they are willing and able to assist as needed      Vision                     Perception     Praxis      Cognition   Behavior During Therapy: [facility] for tasks assessed/performed Overall Cognitive Status: Within Functional Limits for tasks assessed                  General Comments: Pt's mom and sister in room for session  Extremity/Trunk Assessment               Exercises    Shoulder Instructions       General Comments      Pertinent Vitals/ Pain       Pain Assessment: 0-10 Pain Score: 6  Faces Pain Scale: Hurts even more Pain Location: R hip Pain Descriptors / Indicators: Grimacing;Guarding;Throbbing;Tightness Pain Intervention(s): Monitored  during session;Repositioned;Ice applied  Home Living                                          Prior Functioning/Environment              Frequency  Min 2X/week        Progress Toward Goals  OT Goals(current goals can now be found in the care plan section)  Progress towards OT goals: Progressing toward goals  Acute Rehab OT Goals Patient Stated Goal: be able to sit [place] style! OT Goal Formulation: With patient Time For Goal Achievement: 03/20/16 Potential to Achieve Goals: Good  Plan Discharge plan remains appropriate    Co-evaluation                 End of Session Equipment Utilized During Treatment: Gait belt;Rolling walker   Activity Tolerance Patient tolerated treatment well   Patient Left in bed;with call bell/phone within reach;with nursing/sitter in room;with family/visitor present   Nurse Communication Mobility status;Weight bearing status;Other (comment);Precautions (IV site leaking)        Time: 1445-1520 OT Time Calculation (min): 35 min  Charges: OT General Charges $OT Visit: 1 Procedure OT Treatments $Self Care/Home Management : 23-37 mins  Jaci Carrel 03/07/2016, 4:13 PM  Hulda Humphrey OTR/L [phone]

## 2016-03-07 NOTE — Progress Notes (Signed)
Physical Therapy Treatment Patient Details Name: Rose Campbell MRN: [id number] DOB: 1985-06-18 Today's Date: 03/07/2016    History of Present Illness Admitted for RTHA, Post Prec, WBAT;  has a past medical history of Arthritis; Depression; Joint pain; Joint swelling; Lupus; Polyarthralgia; and Seizures ([place]).  has a past surgical history that includes Total hip arthroplasty (Left, 08/03/2015).    PT Comments    This session focused on therex and HEP. HEP, stair management, and precautions reviewed with pt. Mother and sister present. Current plan remains appropriate.   Follow Up Recommendations  Home health PT;Supervision/Assistance - 24 hour     Equipment Recommendations  3in1 (PT)    Recommendations for Other Services OT consult     Precautions / Restrictions Precautions Precautions: Posterior Hip Precaution Booklet Issued: Yes (comment) Precaution Comments: Educated in Glass blower/designer; able to recall post prec from previous THA Restrictions Weight Bearing Restrictions: Yes RLE Weight Bearing: Weight bearing as tolerated    Mobility  Bed Mobility               General bed mobility comments: supine therex only due to IV site leaking--pt receiving blood transfusion  Transfers                    Ambulation/Gait                 Stairs            Wheelchair Mobility    Modified Rankin (Stroke Patients Only)       Balance                                    Cognition Arousal/Alertness: Awake/alert Behavior During Therapy: WFL for tasks assessed/performed Overall Cognitive Status: Within Functional Limits for tasks assessed                      Exercises Total Joint Exercises Ankle Circles/Pumps: AROM;Both;5 reps Quad Sets: AROM;Right;10 reps Short Arc Quad: AROM;Right;10 reps Heel Slides: AROM;Right;10 reps;AAROM Hip ABduction/ADduction: AAROM;Right;10 reps    General Comments General comments (skin integrity,  edema, etc.): mother and sister present; pt given HEP handout and reviewed; pt able to verbalize understanding of stair managment and bed mobility      Pertinent Vitals/Pain Pain Assessment: Faces Faces Pain Scale: Hurts even more Pain Location: R hip with therex Pain Descriptors / Indicators: Sore;Grimacing;Tightness Pain Intervention(s): Monitored during session;Premedicated before session    Home Living                      Prior Function            PT Goals (current goals can now be found in the care plan section) Acute Rehab PT Goals Patient Stated Goal: be able to sit [place] style! PT Goal Formulation: With patient Time For Goal Achievement: 03/12/16 Potential to Achieve Goals: Good Progress towards PT goals: Progressing toward goals    Frequency    7X/week      PT Plan Current plan remains appropriate    Co-evaluation             End of Session Equipment Utilized During Treatment: Gait belt Activity Tolerance: Patient tolerated treatment well Patient left: in bed;with call bell/phone within reach;with family/visitor present     Time: 1345-1409 PT Time Calculation (min) (ACUTE ONLY): 24 min  Charges:  $Therapeutic Exercise: 8-22 mins $  Therapeutic Activity: 8-22 mins                    G Codes:      Salina April, PTA Pager: [phone]   03/07/2016, 2:28 PM

## 2016-03-07 NOTE — Progress Notes (Signed)
PATIENT ID: Rose Campbell        MRN:  [id number]          DOB/AGE: Jan 25, 1985 / 31 y.o.    Joni Fears, MD   Biagio Borg, PA-C [address] [place], [place]  [address]                             [phone]   PROGRESS NOTE  Subjective:  negative for Chest Pain  negative for Shortness of Breath  negative for Nausea/Vomiting   negative for Calf Pain    Tolerating Diet: yes         Patient reports pain as mild.     Receiving two units packed cells  For decreased H&H this am-comfortable  Objective: Vital signs in last 24 hours:   Patient Vitals for the past 24 hrs:  BP Temp Temp src Pulse Resp SpO2  03/07/16 0816 (!) 137/91 98.2 F (36.8 C) Oral (!) 129 18 98 %  03/07/16 0645 133/73 98.6 F (37 C) Oral 87 16 98 %  03/07/16 0630 130/79 98.6 F (37 C) Oral 91 18 100 %  03/07/16 0425 127/65 97.5 F (36.4 C) Oral 95 15 95 %  03/06/16 2017 134/83 97.5 F (36.4 C) Oral (!) 115 15 95 %  03/06/16 1448 132/72 98.4 F (36.9 C) Oral (!) 123 15 95 %      Intake/Output from previous day:   02/14 0701 - 02/15 0700 In: 480 [P.O.:480] Out: 525 [Urine:525]   Intake/Output this shift:   02/15 0701 - 02/15 1900 In: 300 [P.O.:300] Out: -    Intake/Output      02/14 0701 - 02/15 0700 02/15 0701 - 02/16 0700   P.O. 480 300   I.V. (mL/kg)     IV Piggyback     Total Intake(mL/kg) 480 (6.6) 300 (4.1)   Urine (mL/kg/hr) 525 (0.3)    Stool 0 (0)    Blood     Total Output 525     Net -45 +300        Urine Occurrence 2 x 1 x   Stool Occurrence 1 x       LABORATORY DATA:  Recent Labs  03/06/16 0352 03/07/16 0344  WBC 14.1* 12.3*  HGB 8.1* 6.6*  HCT 26.5* 21.7*  PLT 545* 485*    Recent Labs  03/06/16 0352 03/07/16 0344  NA 136 139  K 4.0 3.0*  CL 99* 103  CO2 27 30  BUN 5* 5*  CREATININE 0.65 0.53  GLUCOSE 160* 100*  CALCIUM 8.2* 7.8*   Lab Results  Component Value Date   INR 1.03 02/26/2016   INR 1.11 07/21/2015    Recent Radiographic Studies  :  Dg Hip Port Unilat With Pelvis 1v Right  Result Date: 03/05/2016 CLINICAL DATA:  Total hip arthroplasty. EXAM: DG HIP (WITH OR WITHOUT PELVIS) 1V PORT RIGHT COMPARISON:  10/11/2015. FINDINGS: Total right hip replacement. Anatomic alignment. Hardware intact. Prior left hip replacement. No acute abnormality . IMPRESSION: Total right hip replacement.  Anatomic alignment.  Hardware intact. Electronically Signed   By: Marcello Moores  Register   On: 03/05/2016 11:01     Examination:  General appearance: alert, cooperative and no distress  Wound Exam: clean, dry, intact   Drainage:  None: wound tissue dry  Motor Exam: EHL, FHL, Anterior Tibial and Posterior Tibial Intact  Sensory Exam: Superficial Peroneal, Deep Peroneal and Tibial normal  Vascular Exam:  Normal  Assessment:    2 Days Post-Op  Procedure(s) (LRB): TOTAL HIP ARTHROPLASTY (Right)  ADDITIONAL DIAGNOSIS:  Principal Problem:   Unilateral primary osteoarthritis, right hip Active Problems:   S/P total hip arthroplasty  Acute Blood Loss Anemia-receiving 2 units packed cells   Plan: Physical Therapy as ordered Weight Bearing as Tolerated (WBAT)  DVT Prophylaxis:  Xarelto and TED hose  DISCHARGE PLAN: Home  DISCHARGE NEEDS: HHPT, Walker and 3-in-1 comode seat Dressing changed right hip-wound clean and dry, H&H 6+/21+ this am and receiving 2 units packed cells-wound dry at closure but has some bleeding into thigh with hematoma-thigh compartments supple. Voiding without problem and tolerating diet. Will plan to discharge after 2nd unit this pm       Garald Balding  03/07/2016 9:45 AM  Patient ID: Rose Campbell, female   DOB: September 14, 1985, 31 y.o.   MRN: [id number]

## 2016-03-07 NOTE — Discharge Summary (Signed)
Joni Fears, MD   Biagio Borg, PA-C [address] [place], [place]  [address]                             [phone]  PATIENT ID: Kampbell Difelice        MRN:  [id number]          DOB/AGE: Jan 09, 1986 / 31 y.o.    DISCHARGE SUMMARY  ADMISSION DATE:    03/05/2016 DISCHARGE DATE:   03/07/2016   ADMISSION DIAGNOSIS: RIGHT OSTEOARTHRITIS OF THE HIP    DISCHARGE DIAGNOSIS:  OSTEOARTHRITIS OF THE RIGHT HIP    ADDITIONAL DIAGNOSIS: Principal Problem:   Unilateral primary osteoarthritis, right hip Active Problems:   S/P total hip arthroplasty  Past Medical History:  Diagnosis Date  . Arthritis   . Depression    takes Cymbalta daily  . Joint pain   . Joint swelling   . Lupus   . Polyarthralgia    takes Humira and Methotrexate  . Seizures ([place])    hx of-as a child. No meds    PROCEDURE: Procedure(s): TOTAL HIP ARTHROPLASTY Right on 03/05/2016  CONSULTS: none    HISTORY: Sariya Holmesis a 31 y.o.femaleWho has a history of pain and functional disability in the righthip(s) due to arthritisand patient has failed non-surgical conservative treatments for greater than 12 weeks to include NSAID's and/or analgesics, corticosteriod injections, flexibility and strengthening excercises, supervised PT with diminished ADL's post treatment, use of assistive devices and activity modification. Onset of symptoms was abruptstarting 7years ago with gradually worseningcourse since that time.The patient noted no past surgeryon the righthip(s). Patient currently rates pain in the righthip at 9out of 10 with activity. Patient has night pain, worsening of pain with activity and weight bearing, trendelenberg gait, pain that interfers with activities of daily living, pain with passive range of motion and crepitus. Patient has evidenceofsubchondral cysts, subchondral sclerosis, periarticular osteophytes and joint space narrowingby imaging studies. This condition presents safety issues  increasing the risk of falls.There is no current active infection.  HOSPITAL COURSE:  Pria Canham is a 31 y.o. admitted on 03/05/2016 and found to have a diagnosis of [place].  After appropriate laboratory studies were obtained  they were taken to the operating room on 03/05/2016 and underwent  Procedure(s): TOTAL HIP ARTHROPLASTY  Right.   They were given perioperative antibiotics:  Anti-infectives    Start     Dose/Rate Route Frequency Ordered Stop   03/05/16 1400  ceFAZolin (ANCEF) IVPB 2g/100 mL premix     2 g 200 mL/hr over 30 Minutes Intravenous Every 6 hours 03/05/16 1138 03/05/16 2052   03/05/16 0600  ceFAZolin (ANCEF) IVPB 2g/100 mL premix     2 g 200 mL/hr over 30 Minutes Intravenous On call to O.R. 03/05/16 0600 03/05/16 0750    .  Tolerated the procedure well.  Placed with a foley intraoperatively.    Toradol was given post op.  POD #1, allowed out of bed to a chair.  PT for ambulation and exercise program.  Foley D/C'd in morning.  IV saline locked.  O2 discontionued.  POD #2, continued PT and ambulation.  Given 2 units of PRBC's secondary to decreased HGB from possible bleed into thigh.   The remainder of the hospital course was dedicated to ambulation and strengthening.   The patient was discharged on 2 Days Post-Op in  Stable condition.  Blood products given:2 units  PRBC  DIAGNOSTIC STUDIES: Recent vital signs: Patient Vitals for the past 24 hrs:  BP Temp Temp src Pulse Resp SpO2  03/07/16 0816 (!) 137/91 98.2 F (36.8 C) Oral (!) 129 18 98 %  03/07/16 0645 133/73 98.6 F (37 C) Oral 87 16 98 %  03/07/16 0630 130/79 98.6 F (37 C) Oral 91 18 100 %  03/07/16 0425 127/65 97.5 F (36.4 C) Oral 95 15 95 %  03/06/16 2017 134/83 97.5 F (36.4 C) Oral (!) 115 15 95 %  03/06/16 1448 132/72 98.4 F (36.9 C) Oral (!) 123 15 95 %       Recent laboratory studies:  Recent Labs  03/06/16 0352 03/07/16 0344  WBC 14.1* 12.3*  HGB 8.1* 6.6*    HCT 26.5* 21.7*  PLT 545* 485*    Recent Labs  03/06/16 0352 03/07/16 0344  NA 136 139  K 4.0 3.0*  CL 99* 103  CO2 27 30  BUN 5* 5*  CREATININE 0.65 0.53  GLUCOSE 160* 100*  CALCIUM 8.2* 7.8*   Lab Results  Component Value Date   INR 1.03 02/26/2016   INR 1.11 07/21/2015     Recent Radiographic Studies :  Dg Hip Port Unilat With Pelvis 1v Right  Result Date: 03/05/2016 CLINICAL DATA:  Total hip arthroplasty. EXAM: DG HIP (WITH OR WITHOUT PELVIS) 1V PORT RIGHT COMPARISON:  10/11/2015. FINDINGS: Total right hip replacement. Anatomic alignment. Hardware intact. Prior left hip replacement. No acute abnormality . IMPRESSION: Total right hip replacement.  Anatomic alignment.  Hardware intact. Electronically Signed   By: Marcello Moores  Register   On: 03/05/2016 11:01    DISCHARGE INSTRUCTIONS: Discharge Instructions    Call MD / Call 911    Complete by:  As directed    If you experience chest pain or shortness of breath, CALL 911 and be transported to the hospital emergency room.  If you develope a fever above 101 F, pus (white drainage) or increased drainage or redness at the wound, or calf pain, call your surgeon's office.   Change dressing    Complete by:  As directed    DO NOT CHANGE DRESSING. KEEP ON TILL SEEN IN THE OFFICE   Constipation Prevention    Complete by:  As directed    Drink plenty of fluids.  Prune juice may be helpful.  You may use a stool softener, such as Colace (over the counter) 100 mg twice a day.  Use MiraLax (over the counter) for constipation as needed.   Diet general    Complete by:  As directed    Discharge instructions    Complete by:  As directed    [place] items at home which could result in a fall. This includes throw rugs or furniture in walking pathways ICE to the affected joint every three hours while awake for 30 minutes at a time, for at least the first 3-5 days, and then as needed for pain and swelling.   Continue to use ice for pain and swelling. You may notice swelling that will progress down to the foot and ankle.  This is normal after surgery.  Elevate your leg when you are not up walking on it.   Continue to use the breathing machine you got in the hospital (incentive spirometer) which will help keep your temperature down.  It is common for your temperature to cycle up and down following surgery, especially at night when you are not up moving around  and exerting yourself.  The breathing machine keeps your lungs expanded and your temperature down.   DIET:  As you were doing prior to hospitalization, we recommend a well-balanced diet.  DRESSING / WOUND CARE / SHOWERING  Keep the surgical dressing until follow up.  The dressing is water proof, so you can shower without any extra covering.  IF THE DRESSING FALLS OFF or the wound gets wet inside, change the dressing with sterile gauze.  Please use good hand washing techniques before changing the dressing.  Do not use any lotions or creams on the incision until instructed by your surgeon.    ACTIVITY  Increase activity slowly as tolerated, but follow the weight bearing instructions below.   No driving for 6 weeks or until further direction given by your physician.  You cannot drive while taking narcotics.  No lifting or carrying greater than 10 lbs. until further directed by your surgeon. Avoid periods of inactivity such as sitting longer than an hour when not asleep. This helps prevent blood clots.  You may return to work once you are authorized by your doctor.     WEIGHT BEARING   Weight bearing as tolerated with assist device (walker, cane, etc) as directed, use it as long as suggested by your surgeon or therapist, typically at least 4-6 weeks.   EXERCISES  Results after joint replacement surgery are often greatly improved when you follow the exercise, range of motion and muscle strengthening exercises prescribed by your doctor. Safety  measures are also important to protect the joint from further injury. Any time any of these exercises cause you to have increased pain or swelling, decrease what you are doing until you are comfortable again and then slowly increase them. If you have problems or questions, call your caregiver or physical therapist for advice.   Rehabilitation is important following a joint replacement. After just a few days of immobilization, the muscles of the leg can become weakened and shrink (atrophy).  These exercises are designed to build up the tone and strength of the thigh and leg muscles and to improve motion. Often times heat used for twenty to thirty minutes before working out will loosen up your tissues and help with improving the range of motion but do not use heat for the first two weeks following surgery (sometimes heat can increase post-operative swelling).   These exercises can be done on a training (exercise) mat, on a table or on a bed. Use whatever works the best and is most comfortable for you.    Use music or television while you are exercising so that the exercises are a pleasant break in your day. This will make your life better with the exercises acting as a break in your routine that you can look forward to.   Perform all exercises about fifteen times, three times per day or as directed.  You should exercise both the operative leg and the other leg as well.   Exercises include:  Quad Sets - Tighten up the muscle on the front of the thigh (Quad) and hold for 5-10 seconds.   Straight Leg Raises - With your knee straight (if you were given a brace, keep it on), lift the leg to 60 degrees, hold for 3 seconds, and slowly lower the leg.  Perform this exercise against resistance later as your leg gets stronger.  Leg Slides: Lying on your back, slowly slide your foot toward your buttocks, bending your knee up off the floor (only go  as far as is comfortable). Then slowly slide your foot back down until  your leg is flat on the floor again.  Angel Wings: Lying on your back spread your legs to the side as far apart as you can without causing discomfort.  Hamstring Strength:  Lying on your back, push your heel against the floor with your leg straight by tightening up the muscles of your buttocks.  Repeat, but this time bend your knee to a comfortable angle, and push your heel against the floor.  You may put a pillow under the heel to make it more comfortable if necessary.   A rehabilitation program following joint replacement surgery can speed recovery and prevent re-injury in the future due to weakened muscles. Contact your doctor or a physical therapist for more information on knee rehabilitation.    CONSTIPATION  Constipation is defined medically as fewer than three stools per week and severe constipation as less than one stool per week.  Even if you have a regular bowel pattern at home, your normal regimen is likely to be disrupted due to multiple reasons following surgery.  Combination of anesthesia, postoperative narcotics, change in appetite and fluid intake all can affect your bowels.   YOU MUST use at least one of the following options; they are listed in order of increasing strength to get the job done.  They are all available over the counter, and you may need to use some, POSSIBLY even all of these options:    Drink plenty of fluids (prune juice may be helpful) and high fiber foods Colace 100 mg by mouth twice a day  Senokot for constipation as directed and as needed Dulcolax (bisacodyl), take with full glass of water  Miralax (polyethylene glycol) once or twice a day as needed.  If you have tried all these things and are unable to have a bowel movement in the first 3-4 days after surgery call either your surgeon or your primary doctor.    If you experience loose stools or diarrhea, hold the medications until you stool forms back up.  If your symptoms do not get better within 1 week or  if they get worse, check with your doctor.  If you experience "the worst abdominal pain ever" or develop nausea or vomiting, please contact the office immediately for further recommendations for treatment.   ITCHING:  If you experience itching with your medications, try taking only a single pain pill, or even half a pain pill at a time.  You can also use Benadryl over the counter for itching or also to help with sleep.   TED HOSE STOCKINGS:  Use stockings on both legs until for at least 2 weeks or as directed by physician office. They may be removed at night for sleeping.  MEDICATIONS:  See your medication summary on the "After Visit Summary" that nursing will review with you.  You may have some home medications which will be placed on hold until you complete the course of blood thinner medication.  It is important for you to complete the blood thinner medication as prescribed.  PRECAUTIONS:  If you experience chest pain or shortness of breath - call 911 immediately for transfer to the hospital emergency department.   If you develop a fever greater that 101 F, purulent drainage from wound, increased redness or drainage from wound, foul odor from the wound/dressing, or calf pain - CONTACT YOUR SURGEON.  FOLLOW-UP APPOINTMENTS:  If you do not already have a post-op appointment, please call the office for an appointment to be seen by your surgeon.  Guidelines for how soon to be seen are listed in your "After Visit Summary", but are typically between 1-4 weeks after surgery.  OTHER INSTRUCTIONS:   Knee Replacement:  Do not place pillow under knee, focus on keeping the knee straight while resting. CPM instructions: 0-90 degrees, 2 hours in the morning, 2 hours in the afternoon, and 2 hours in the evening. Place foam block, curve side up under heel at all times except when in CPM or when walking.  DO NOT modify, tear, cut, or change the foam block in any  way.  MAKE SURE YOU:  Understand these instructions.  Get help right away if you are not doing well or get worse.    Thank you for letting us be a part of your medical care team.  It is a privilege we respect greatly.  We hope these instructions will help you stay on track for a fast and full recovery!   Driving restrictions    Complete by:  As directed    No driving for 6 weeks   Follow the hip precautions as taught in Physical Therapy    Complete by:  As directed    Increase activity slowly as tolerated    Complete by:  As directed    Lifting restrictions    Complete by:  As directed    No lifting for 6 weeks   Patient may shower    Complete by:  As directed    [place].  DO NOT REMOVE DRESSING   TED hose    Complete by:  As directed    Use stockings (TED hose) for 2 weeks on RIGHT leg(s).  You may remove them at night for sleeping.   Weight bearing as tolerated    Complete by:  As directed    Laterality:  right   Extremity:  Lower      DISCHARGE MEDICATIONS:   Allergies as of 03/07/2016      Reactions   Latex Hives      Medication List    STOP taking these medications   HYDROcodone-acetaminophen 5-325 MG tablet Commonly known as:  NORCO/VICODIN     TAKE these medications   DULoxetine 60 MG capsule Commonly known as:  CYMBALTA TAKE 1 CAPSULE BY MOUTH TWO TIMES DAILY   methocarbamol 500 MG tablet Commonly known as:  ROBAXIN Take 1 tablet (500 mg total) by mouth every 6 (six) hours as needed for muscle spasms.   ORTHO TRI-CYCLEN LO PO Take 1 tablet by mouth daily.   oxyCODONE 5 MG immediate release tablet Commonly known as:  Oxy IR/ROXICODONE Take 1-2 tablets (5-10 mg total) by mouth every 4 (four) hours as needed for breakthrough pain.   predniSONE 10 MG tablet Commonly known as:  DELTASONE Take 1 tablet (10 mg total) by mouth daily with breakfast.   rivaroxaban 10 MG Tabs tablet Commonly known as:  XARELTO Take 1 tablet (10 mg  total) by mouth daily with breakfast. Start taking on:  03/08/2016            Durable Medical Equipment        Start     Ordered   03/05/16 1139  DME Walker rolling  Once    Question:  Patient needs a walker to treat with the following condition  Answer:  Status  post total hip replacement, right   03/05/16 1138   03/05/16 1139  DME 3 n 1  Once     03/05/16 1138   03/05/16 1139  DME Bedside commode  Once    Question:  Patient needs a bedside commode to treat with the following condition  Answer:  Status post total hip replacement, right   03/05/16 1138      FOLLOW UP VISIT:   Follow-up Information    [facility] Follow up.   Specialty:  [place] Why:  Someone from [facility] will contact you to arrange start date and time for therapy. Contact information: [place] [address] [phone]        Garald Balding, MD Follow up on 03/18/2016.   Specialty:  Orthopedic Surgery Contact information: [address] [place] [address] [phone]           DISPOSITION:   Home  CONDITION:  Stable   Mike Craze. [place], [place] [phone]  03/07/2016 10:24 AM

## 2016-03-07 NOTE — Progress Notes (Signed)
Transfused 2 units PRBC successfully. VSS and no S/S of reaction. H&H lab draw placed. Will call Dr. Durward Fortes with results. Will continue to monitor

## 2016-03-07 NOTE — Progress Notes (Signed)
H&H results called to Dr. Lorrin Mais and pt ready for discharge. Education/instructions reviewed with pt and family, and all questions/concerns addressed. IV removed and belongings gathered. Pt will be transported out via wheelchair to mother's vehicle

## 2016-03-08 LAB — TYPE AND SCREEN
BLOOD PRODUCT EXPIRATION DATE: 201803152359
Blood Product Expiration Date: 201803152359
ISSUE DATE / TIME: 201802150629
ISSUE DATE / TIME: 201802151116
UNIT TYPE AND RH: 5100
UNIT TYPE AND RH: 5100

## 2016-03-12 ENCOUNTER — Telehealth (INDEPENDENT_AMBULATORY_CARE_PROVIDER_SITE_OTHER): Payer: Self-pay | Admitting: Orthopaedic Surgery

## 2016-03-12 NOTE — Telephone Encounter (Signed)
NEEDS TO COME IN TODAY!!!

## 2016-03-12 NOTE — Telephone Encounter (Signed)
Patient called saying that her dressing is draining and she is experiencing some pain in her leg.  She wants to know if that is normal after hip replacement surgery or does she need to come in.  Cb#[phone].

## 2016-03-12 NOTE — Telephone Encounter (Signed)
Please advise 

## 2016-03-12 NOTE — Telephone Encounter (Signed)
I told her to come into ASAP today

## 2016-03-13 ENCOUNTER — Ambulatory Visit (INDEPENDENT_AMBULATORY_CARE_PROVIDER_SITE_OTHER): Payer: 59

## 2016-03-13 ENCOUNTER — Encounter (INDEPENDENT_AMBULATORY_CARE_PROVIDER_SITE_OTHER): Payer: Self-pay | Admitting: Orthopedic Surgery

## 2016-03-13 ENCOUNTER — Ambulatory Visit (INDEPENDENT_AMBULATORY_CARE_PROVIDER_SITE_OTHER): Payer: 59 | Admitting: Orthopedic Surgery

## 2016-03-13 VITALS — BP 145/110 | HR 126 | Resp 16 | Ht 62.0 in | Wt 160.0 lb

## 2016-03-13 DIAGNOSIS — Z92241 Personal history of systemic steroid therapy: Secondary | ICD-10-CM

## 2016-03-13 DIAGNOSIS — Z96641 Presence of right artificial hip joint: Secondary | ICD-10-CM

## 2016-03-13 DIAGNOSIS — L930 Discoid lupus erythematosus: Secondary | ICD-10-CM

## 2016-03-13 LAB — CBC WITH DIFFERENTIAL/PLATELET
BASOS PCT: 1 %
Basophils Absolute: 118 cells/uL (ref 0–200)
EOS ABS: 472 {cells}/uL (ref 15–500)
Eosinophils Relative: 4 %
HEMATOCRIT: 33.2 % — AB (ref 35.0–45.0)
Hemoglobin: 10.8 g/dL — ABNORMAL LOW (ref 11.7–15.5)
LYMPHS PCT: 23 %
Lymphs Abs: 2714 cells/uL (ref 850–3900)
MCH: 26.3 pg — ABNORMAL LOW (ref 27.0–33.0)
MCHC: 32.5 g/dL (ref 32.0–36.0)
MCV: 80.8 fL (ref 80.0–100.0)
MONO ABS: 1062 {cells}/uL — AB (ref 200–950)
MPV: 8.3 fL (ref 7.5–12.5)
Monocytes Relative: 9 %
Neutro Abs: 7434 cells/uL (ref 1500–7800)
Neutrophils Relative %: 63 %
Platelets: 811 10*3/uL — ABNORMAL HIGH (ref 140–400)
RBC: 4.11 MIL/uL (ref 3.80–5.10)
RDW: 16.2 % — AB (ref 11.0–15.0)
WBC: 11.8 10*3/uL — ABNORMAL HIGH (ref 3.8–10.8)

## 2016-03-13 LAB — COMPREHENSIVE METABOLIC PANEL
ALK PHOS: 80 U/L (ref 33–115)
ALT: 9 U/L (ref 6–29)
AST: 10 U/L (ref 10–30)
Albumin: 3.4 g/dL — ABNORMAL LOW (ref 3.6–5.1)
BUN: 8 mg/dL (ref 7–25)
CALCIUM: 8.7 mg/dL (ref 8.6–10.2)
CO2: 25 mmol/L (ref 20–31)
Chloride: 104 mmol/L (ref 98–110)
Creat: 0.61 mg/dL (ref 0.50–1.10)
GLUCOSE: 75 mg/dL (ref 65–99)
POTASSIUM: 4 mmol/L (ref 3.5–5.3)
Sodium: 137 mmol/L (ref 135–146)
Total Bilirubin: 0.4 mg/dL (ref 0.2–1.2)
Total Protein: 6.9 g/dL (ref 6.1–8.1)

## 2016-03-13 NOTE — Progress Notes (Signed)
Office Visit Note   Patient: Rose Campbell           Date of Birth: March 13, 1985           MRN: [id number] Visit Date: 03/13/2016              Requested by: Darreld Mclean, MD [address] [place], [place] [address] PCP: Lamar Blinks, MD   Assessment & Plan: Visit Diagnoses:  1. Status post right hip replacement   2. History of recent steroid use   3. Lupus erythematosus, unspecified form     Plan:  #1: CBC, CMET stat #2: X-Ray pelvis and hip #3: New dressing applied  Follow-Up Instructions: Return as scheduled.   Orders:  Orders Placed This Encounter  Procedures  . XR HIP UNILAT W OR W/O PELVIS 2-3 VIEWS RIGHT  . CBC with Differential  . Comprehensive Metabolic Panel (CMET)   No orders of the defined types were placed in this encounter.     Procedures: No procedures performed   Clinical Data: No additional findings.   Subjective: Chief Complaint  Patient presents with  . Right Hip - Pain, Wound Check    Pt presents with drainage from a Right Total Hip surgery on 03/05/16.  She relates she had to change the dressing 5 times. No fevers, pt ambulating with a walker. She states her whole right leg is in pain when she lies down, did not happen in previous surgery.Hurts to bend her knee and thigh pain.    Review of Systems  Allergic/Immunologic: Positive for immunocompromised state (Lupus and steroid use Lupus and steroid use).     Objective: Vital Signs: BP (!) 145/110   Pulse (!) 126   Resp 16   Ht 5\' 2"  (1.575 m)   Wt 160 lb (72.6 kg)   LMP 02/16/2016   BMI 29.26 kg/m   Physical Exam  Right Hip Exam   Range of Motion  Extension: 0  Right hip flexion: 95.   Other  Sensation: normal Pulse: present (very strong with doppler)  Comments:  Ambulating with a walker with a very natural gait and does not appear to be that painful. She is walking very quickly. She has no pitting edema. EHL FHL anterior posterior tibs are  intact. Negative straight leg Raising. Calf is supple . Sensation is intact to light touch. She sits comfortably at 90 of flexion. She has about 10-15 of internal and external rotation which does cause her some pain.  Wound is clean and dry at this time. Her dressing had scant amount of fluid on the outer layers of the dressing. The wound itself was dry.      Specialty Comments:  No specialty comments available.  Imaging: Xr Hip Unilat W Or W/o Pelvis 2-3 Views Right  Result Date: 03/13/2016 Two-view x-rays today of the pelvis and right hip reveals excellent position alignment of the prosthesis. I do not see any occult pathology at this time.    PMFS History: Patient Active Problem List   Diagnosis Date Noted  . Unilateral primary osteoarthritis, right hip 03/05/2016  . Osteoarthritis of left hip 08/03/2015  . 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 Latex allergy 08/01/2015  . Weight gain due to medication 10/19/2014  . Depression with anxiety 08/06/2013  . Uses oral contraception 07/01/2012  . Lupus 08/09/2011  . Joint pain 07/23/2011   Past Medical History:  Diagnosis Date  . Arthritis   . Depression  takes Cymbalta daily  . Joint pain   . Joint swelling   . Lupus   . Polyarthralgia    takes Humira and Methotrexate  . Seizures ([place])    hx of-as a child. No meds    History reviewed. No pertinent family history.  Past Surgical History:  Procedure Laterality Date  . TOTAL HIP ARTHROPLASTY Left 08/03/2015   Procedure: LEFT TOTAL HIP ARTHROPLASTY;  Surgeon: Garald Balding, MD;  Location: [facility];  Service: Orthopedics;  Laterality: Left;  . TOTAL HIP ARTHROPLASTY Right 03/05/2016   Procedure: TOTAL HIP ARTHROPLASTY;  Surgeon: Garald Balding, MD;  Location: [facility];  Service: Orthopedics;  Laterality: Right;   Social History   Occupational History  . Not on file.   Social History Main Topics  . Smoking status: Never Smoker  . Smokeless tobacco: Never Used    . Alcohol use No  . Drug use: No  . Sexual activity: Yes    Birth control/ protection: Pill

## 2016-03-14 ENCOUNTER — Telehealth (INDEPENDENT_AMBULATORY_CARE_PROVIDER_SITE_OTHER): Payer: Self-pay | Admitting: Orthopaedic Surgery

## 2016-03-14 ENCOUNTER — Other Ambulatory Visit (INDEPENDENT_AMBULATORY_CARE_PROVIDER_SITE_OTHER): Payer: Self-pay | Admitting: Orthopedic Surgery

## 2016-03-14 MED ORDER — OXYCODONE HCL 5 MG PO TABS: 5.0000 mg | ORAL_TABLET | ORAL | 0 refills | Status: DC | PRN

## 2016-03-14 NOTE — Telephone Encounter (Signed)
Patient called requesting a refill on her muscle relaxers and also her oxycodone.  Cb#[phone].  Thank you.

## 2016-03-14 NOTE — Telephone Encounter (Signed)
Called pt and left rx at front desk. 

## 2016-03-18 ENCOUNTER — Other Ambulatory Visit (INDEPENDENT_AMBULATORY_CARE_PROVIDER_SITE_OTHER): Payer: Self-pay

## 2016-03-18 ENCOUNTER — Other Ambulatory Visit: Payer: Self-pay | Admitting: Family Medicine

## 2016-03-18 DIAGNOSIS — F32A Depression, unspecified: Secondary | ICD-10-CM

## 2016-03-18 DIAGNOSIS — F329 Major depressive disorder, single episode, unspecified: Secondary | ICD-10-CM

## 2016-03-18 MED ORDER — METHOCARBAMOL 500 MG PO TABS: 500.0000 mg | ORAL_TABLET | Freq: Two times a day (BID) | ORAL | 0 refills | Status: DC | PRN

## 2016-03-18 NOTE — Telephone Encounter (Signed)
Ok to give  Robaxin #30  1 po q 12hours prn spasms

## 2016-03-18 NOTE — Telephone Encounter (Signed)
Is is OK to give the Robaxin today for her?

## 2016-03-18 NOTE — Telephone Encounter (Signed)
Patient called regarding her muscle relaxers.  Cb#[phone].

## 2016-03-18 NOTE — Telephone Encounter (Signed)
Faxed to [employer] and called pt

## 2016-03-19 ENCOUNTER — Other Ambulatory Visit: Payer: Self-pay | Admitting: Emergency Medicine

## 2016-03-19 DIAGNOSIS — F32A Depression, unspecified: Secondary | ICD-10-CM

## 2016-03-19 DIAGNOSIS — F329 Major depressive disorder, single episode, unspecified: Secondary | ICD-10-CM

## 2016-03-19 MED ORDER — DULOXETINE HCL 60 MG PO CPEP: ORAL_CAPSULE | ORAL | 1 refills | Status: DC

## 2016-03-22 ENCOUNTER — Inpatient Hospital Stay (INDEPENDENT_AMBULATORY_CARE_PROVIDER_SITE_OTHER): Payer: 59 | Admitting: Orthopaedic Surgery

## 2016-03-22 ENCOUNTER — Telehealth (INDEPENDENT_AMBULATORY_CARE_PROVIDER_SITE_OTHER): Payer: Self-pay | Admitting: Orthopaedic Surgery

## 2016-03-22 NOTE — Telephone Encounter (Signed)
Ms. Rosana Berger from [employer] needs to confirm when this patient had surgery so she can approve her long term disability.  [phone]. Thank you.

## 2016-03-22 NOTE — Telephone Encounter (Signed)
Please call.

## 2016-03-25 ENCOUNTER — Ambulatory Visit (INDEPENDENT_AMBULATORY_CARE_PROVIDER_SITE_OTHER): Payer: 59 | Admitting: Orthopaedic Surgery

## 2016-03-25 ENCOUNTER — Encounter (INDEPENDENT_AMBULATORY_CARE_PROVIDER_SITE_OTHER): Payer: Self-pay | Admitting: Orthopaedic Surgery

## 2016-03-25 VITALS — BP 140/102 | HR 101 | Ht 62.0 in | Wt 160.0 lb

## 2016-03-25 DIAGNOSIS — Z96641 Presence of right artificial hip joint: Secondary | ICD-10-CM

## 2016-03-25 MED ORDER — OXYCODONE HCL 5 MG PO TABS: 5.0000 mg | ORAL_TABLET | Freq: Two times a day (BID) | ORAL | 0 refills | Status: DC | PRN

## 2016-03-25 NOTE — Telephone Encounter (Signed)
LVMOM to confirm 03/05/16 Right THA

## 2016-03-25 NOTE — Progress Notes (Signed)
   Office Visit Note   Patient: Rose Campbell           Date of Birth: 1985/07/11           MRN: [id number] Visit Date: 03/25/2016              Requested by: Darreld Mclean, MD [place], [place] [address] PCP: Lamar Blinks, MD   Assessment & Plan: Visit Diagnoses: 3 weeks status post primary right total hip replacement-doing well  Plan: Weightbearing as tolerated with the walker or cane, renew OxyIR, removed surgical clips and apply Steri-Strips, office 2 weeks and set up outpatient physical therapy at [facility] Instructions: No Follow-up on file.   Orders:  No orders of the defined types were placed in this encounter.  No orders of the defined types were placed in this encounter.     Procedures: No procedures performed   Clinical Data: No additional findings.   Subjective: No chief complaint on file.   3 weeks Status post right hip replacement  (03/05/16)  History of recent steroid use   Lupus erythematosus, unspecified form  Pt having a very hard time sleeping and still in pain    Rose Campbell at some mild drainage from right hip postoperatively. She was seen by Dr. Marlou Sa who felt that there was no evidence of infection. Subsequent to that there is been no further drainage and she's doing well with her walker.  Review of Systems   Objective: Vital Signs: There were no vitals taken for this visit.  Physical Exam  Ortho Exam right hip incision is healing nicely without evidence of infection, clips are removed and Steri-Strips applied. Skin intact. No obvious hematoma. Neurovascular exam intact. Leg lengths appear to be symmetrical.  Specialty Comments:  No specialty comments available.  Imaging: No results found.   PMFS History: Patient Active Problem List   Diagnosis Date Noted  . Unilateral primary osteoarthritis, right hip 03/05/2016  . Osteoarthritis of left hip 08/03/2015  . S/P total hip  arthroplasty 08/03/2015  . Latex allergy 08/01/2015  . Weight gain due to medication 10/19/2014  . Depression with anxiety 08/06/2013  . Uses oral contraception 07/01/2012  . Lupus 08/09/2011  . Joint pain 07/23/2011   Past Medical History:  Diagnosis Date  . Arthritis   . Depression    takes Cymbalta daily  . Joint pain   . Joint swelling   . Lupus   . Polyarthralgia    takes Humira and Methotrexate  . Seizures ([place])    hx of-as a child. No meds    No family history on file.  Past Surgical History:  Procedure Laterality Date  . TOTAL HIP ARTHROPLASTY Left 08/03/2015   Procedure: LEFT TOTAL HIP ARTHROPLASTY;  Surgeon: Garald Balding, MD;  Location: [facility];  Service: Orthopedics;  Laterality: Left;  . TOTAL HIP ARTHROPLASTY Right 03/05/2016   Procedure: TOTAL HIP ARTHROPLASTY;  Surgeon: Garald Balding, MD;  Location: [facility];  Service: Orthopedics;  Laterality: Right;   Social History   Occupational History  . Not on file.   Social History Main Topics  . Smoking status: Never Smoker  . Smokeless tobacco: Never Used  . Alcohol use No  . Drug use: No  . Sexual activity: Yes    Birth control/ protection: Pill

## 2016-03-26 ENCOUNTER — Encounter: Payer: Self-pay | Admitting: Family Medicine

## 2016-03-26 DIAGNOSIS — Z0271 Encounter for disability determination: Secondary | ICD-10-CM

## 2016-03-26 DIAGNOSIS — M329 Systemic lupus erythematosus, unspecified: Secondary | ICD-10-CM

## 2016-03-26 DIAGNOSIS — D8989 Other specified disorders involving the immune mechanism, not elsewhere classified: Secondary | ICD-10-CM

## 2016-03-28 MED ORDER — PREDNISONE 10 MG PO TABS: 10.0000 mg | ORAL_TABLET | Freq: Every day | ORAL | 0 refills | Status: DC

## 2016-03-31 MED ORDER — PREDNISONE 10 MG PO TABS: 10.0000 mg | ORAL_TABLET | Freq: Every day | ORAL | 0 refills | Status: DC

## 2016-03-31 NOTE — Addendum Note (Signed)
Addended by: Lamar Blinks C on: 03/31/2016 08:13 PM   Modules accepted: Orders

## 2016-04-04 ENCOUNTER — Other Ambulatory Visit: Payer: Self-pay | Admitting: Emergency Medicine

## 2016-04-04 ENCOUNTER — Telehealth: Payer: Self-pay | Admitting: Family Medicine

## 2016-04-04 NOTE — Telephone Encounter (Signed)
Patient's employer called stating they are faxing forms back to provider for it to be completed. States they need to know when patient can return to work and what limitations she has.

## 2016-04-04 NOTE — Telephone Encounter (Signed)
Employer called to verify receipt of form. Per Lonna Cobb, form has been received and all required information on the form has been provided.

## 2016-04-04 NOTE — Telephone Encounter (Signed)
Paperwork has been faxed to employer, confirmation received.

## 2016-04-08 ENCOUNTER — Encounter (INDEPENDENT_AMBULATORY_CARE_PROVIDER_SITE_OTHER): Payer: Self-pay | Admitting: Orthopaedic Surgery

## 2016-04-08 ENCOUNTER — Ambulatory Visit (INDEPENDENT_AMBULATORY_CARE_PROVIDER_SITE_OTHER): Payer: 59 | Admitting: Orthopaedic Surgery

## 2016-04-08 VITALS — BP 136/98 | HR 92 | Resp 16 | Ht 62.0 in | Wt 160.0 lb

## 2016-04-08 DIAGNOSIS — Z96641 Presence of right artificial hip joint: Secondary | ICD-10-CM

## 2016-04-08 NOTE — Progress Notes (Signed)
   Office Visit Note   Patient: Rose Campbell           Date of Birth: Mar 11, 1985           MRN: [id number] Visit Date: 04/08/2016              Requested by: Darreld Mclean, MD [address] [place], [place] [address] PCP: Lamar Blinks, MD   Assessment & Plan: Visit Diagnoses: s/p right THR 5 weeks. Think she is doing well. Groin pain related to recent fall-we will monitor  Plan:OP PT, continue with cane, office 3 weeks  Follow-Up Instructions: No Follow-up on file.   Orders:  No orders of the defined types were placed in this encounter.  No orders of the defined types were placed in this encounter.     Procedures: No procedures performed   Clinical Data: No additional findings.   Subjective: Chief Complaint  Patient presents with  . Right Hip - Routine Post Op    Ms. Rose Campbell is 5 weeks status post Right total hip replacement.  Pt ambulating with a cane and trying to lose some of the weight brought on by the prednisone for her RA.  Pt relates she has right groin pain, as she feel off her bed trying to maneuver onto her left leg.  Rose Campbell had a recent minor fall with onset of groin pain. No bruising. No increased swelling. His fever and chills.  Review of Systems   Objective: Vital Signs: There were no vitals taken for this visit.  Physical Exam  Ortho Exam leg lengths appeared to be symmetrical, mild pain with internal/external rotation of right hip compared to left. No swelling distally neurovascular exam intact. Incision healing nicely.  Specialty Comments:  No specialty comments available.  Imaging: No results found.   PMFS History: Patient Active Problem List   Diagnosis Date Noted  . Unilateral primary osteoarthritis, right hip 03/05/2016  . Osteoarthritis of left hip 08/03/2015  . 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 Latex allergy 08/01/2015  . Weight gain due to medication 10/19/2014  . Depression with anxiety  08/06/2013  . Uses oral contraception 07/01/2012  . Lupus 08/09/2011  . Joint pain 07/23/2011   Past Medical History:  Diagnosis Date  . Arthritis   . Depression    takes Cymbalta daily  . Joint pain   . Joint swelling   . Lupus   . Polyarthralgia    takes Humira and Methotrexate  . Seizures ([place])    hx of-as a child. No meds    No family history on file.  Past Surgical History:  Procedure Laterality Date  . TOTAL HIP ARTHROPLASTY Left 08/03/2015   Procedure: LEFT TOTAL HIP ARTHROPLASTY;  Surgeon: Garald Balding, MD;  Location: [facility];  Service: Orthopedics;  Laterality: Left;  . TOTAL HIP ARTHROPLASTY Right 03/05/2016   Procedure: TOTAL HIP ARTHROPLASTY;  Surgeon: Garald Balding, MD;  Location: [facility];  Service: Orthopedics;  Laterality: Right;   Social History   Occupational History  . Not on file.   Social History Main Topics  . Smoking status: Never Smoker  . Smokeless tobacco: Never Used  . Alcohol use No  . Drug use: No  . Sexual activity: Yes    Birth control/ protection: Pill

## 2016-04-28 ENCOUNTER — Encounter: Payer: Self-pay | Admitting: Family Medicine

## 2016-04-28 DIAGNOSIS — D8989 Other specified disorders involving the immune mechanism, not elsewhere classified: Secondary | ICD-10-CM

## 2016-04-28 DIAGNOSIS — M329 Systemic lupus erythematosus, unspecified: Secondary | ICD-10-CM

## 2016-04-29 MED ORDER — PREDNISONE 10 MG PO TABS: 10.0000 mg | ORAL_TABLET | Freq: Every day | ORAL | 1 refills | Status: DC

## 2016-05-03 ENCOUNTER — Ambulatory Visit (INDEPENDENT_AMBULATORY_CARE_PROVIDER_SITE_OTHER): Payer: 59

## 2016-05-03 ENCOUNTER — Ambulatory Visit (INDEPENDENT_AMBULATORY_CARE_PROVIDER_SITE_OTHER): Payer: 59 | Admitting: Orthopaedic Surgery

## 2016-05-03 ENCOUNTER — Encounter (INDEPENDENT_AMBULATORY_CARE_PROVIDER_SITE_OTHER): Payer: Self-pay | Admitting: Orthopaedic Surgery

## 2016-05-03 VITALS — BP 133/102 | HR 94 | Ht 63.0 in | Wt 170.0 lb

## 2016-05-03 DIAGNOSIS — Z96641 Presence of right artificial hip joint: Secondary | ICD-10-CM

## 2016-05-03 NOTE — Progress Notes (Signed)
Office Visit Note   Patient: Rose Campbell           Date of Birth: 06-21-1985           MRN: [id number] Visit Date: 05/03/2016              Requested by: Darreld Mclean, MD [address] [place], [place] [address] PCP: Lamar Blinks, MD   Assessment & Plan: Visit Diagnoses:  1. Status post right hip replacement   Early myositis ossificans right hip though I believe is causing her some discomfort. She presently is on prednisone for her arthritis and will restart Humira.  Plan: Follow-up in 2 months, continue exercises and use of cane.  Follow-Up Instructions: Return in about 2 months (around 07/03/2016).   Orders:  Orders Placed This Encounter  Procedures  . XR Pelvis 1-2 Views   No orders of the defined types were placed in this encounter.     Procedures: No procedures performed   Clinical Data: No additional findings.   Subjective: Chief Complaint  Patient presents with  . Right Hip - Routine Post Op    Ms. Rose Campbell is a 31 year old female that is 2 month status post Total right hip replacement. She is still having groin pain, having trouble getting comfortable sleeping. Her RA doctor is putting back on Humiera.   2 months status post primary right total hip replacement and doing well with the exception of some groin pain. She denies fever or chills. Her incision is healing without problem. No numbness or tingling. Being followed by the rheumatologist and presently taking 10 mg of prednisone daily. She'll restart her Humira within the next week or 2 HPI  Review of Systems   Objective: Vital Signs: BP (!) 133/102   Pulse 94   Ht 5\' 3"  (1.6 m)   Wt 170 lb (77.1 kg)   BMI 30.11 kg/m   Physical Exam  Ortho Exam painless range of motion of both hips with the exception of the right with his for mild plan the extreme of internal/external rotation. Lower legs appear to be symmetrical. Incision is healing nicely right hip without problem.  Neurovascular exam intact.  Specialty Comments:  No specialty comments available.  Imaging: Xr Pelvis 1-2 Views  Result Date: 05/03/2016 The pelvis and lateral the right hip were obtained. The right hip joint is located. There is some mild ectopic calcification consistent with myositis ossificans. Similar findings identified on the left. No complicating features identified within the myositis.    PMFS History: Patient Active Problem List   Diagnosis Date Noted  . Unilateral primary osteoarthritis, right hip 03/05/2016  . Osteoarthritis of left hip 08/03/2015  . 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 Latex allergy 08/01/2015  . Weight gain due to medication 10/19/2014  . Depression with anxiety 08/06/2013  . Uses oral contraception 07/01/2012  . Lupus 08/09/2011  . Joint pain 07/23/2011   Past Medical History:  Diagnosis Date  . Arthritis   . Depression    takes Cymbalta daily  . Joint pain   . Joint swelling   . Lupus   . Polyarthralgia    takes Humira and Methotrexate  . Seizures ([place])    hx of-as a child. No meds    History reviewed. No pertinent family history.  Past Surgical History:  Procedure Laterality Date  . TOTAL HIP ARTHROPLASTY Left 08/03/2015   Procedure: LEFT TOTAL HIP ARTHROPLASTY;  Surgeon: Garald Balding, MD;  Location:  [facility];  Service: Orthopedics;  Laterality: Left;  . TOTAL HIP ARTHROPLASTY Right 03/05/2016   Procedure: TOTAL HIP ARTHROPLASTY;  Surgeon: Garald Balding, MD;  Location: [facility];  Service: Orthopedics;  Laterality: Right;   Social History   Occupational History  . Not on file.   Social History Main Topics  . Smoking status: Never Smoker  . Smokeless tobacco: Never Used  . Alcohol use No  . Drug use: No  . Sexual activity: Yes    Birth control/ protection: Pill

## 2016-05-17 ENCOUNTER — Telehealth (INDEPENDENT_AMBULATORY_CARE_PROVIDER_SITE_OTHER): Payer: Self-pay | Admitting: Orthopaedic Surgery

## 2016-05-22 NOTE — Telephone Encounter (Signed)
Opened in error

## 2016-06-17 ENCOUNTER — Encounter: Payer: Self-pay | Admitting: Family Medicine

## 2016-06-17 ENCOUNTER — Other Ambulatory Visit: Payer: Self-pay | Admitting: Family Medicine

## 2016-06-17 DIAGNOSIS — F329 Major depressive disorder, single episode, unspecified: Secondary | ICD-10-CM

## 2016-06-17 DIAGNOSIS — F32A Depression, unspecified: Secondary | ICD-10-CM

## 2016-06-17 DIAGNOSIS — M329 Systemic lupus erythematosus, unspecified: Secondary | ICD-10-CM

## 2016-06-17 DIAGNOSIS — D8989 Other specified disorders involving the immune mechanism, not elsewhere classified: Secondary | ICD-10-CM

## 2016-06-18 ENCOUNTER — Other Ambulatory Visit: Payer: Self-pay | Admitting: Emergency Medicine

## 2016-06-18 DIAGNOSIS — F32A Depression, unspecified: Secondary | ICD-10-CM

## 2016-06-18 DIAGNOSIS — F329 Major depressive disorder, single episode, unspecified: Secondary | ICD-10-CM

## 2016-06-18 DIAGNOSIS — D8989 Other specified disorders involving the immune mechanism, not elsewhere classified: Secondary | ICD-10-CM

## 2016-06-18 DIAGNOSIS — M329 Systemic lupus erythematosus, unspecified: Secondary | ICD-10-CM

## 2016-06-18 MED ORDER — PREDNISONE 10 MG PO TABS: 10.0000 mg | ORAL_TABLET | Freq: Every day | ORAL | 1 refills | Status: DC

## 2016-06-18 MED ORDER — DULOXETINE HCL 60 MG PO CPEP: ORAL_CAPSULE | ORAL | 1 refills | Status: DC

## 2016-07-05 ENCOUNTER — Ambulatory Visit (INDEPENDENT_AMBULATORY_CARE_PROVIDER_SITE_OTHER): Payer: 59 | Admitting: Orthopaedic Surgery

## 2016-07-15 ENCOUNTER — Encounter (INDEPENDENT_AMBULATORY_CARE_PROVIDER_SITE_OTHER): Payer: Self-pay

## 2016-07-15 ENCOUNTER — Ambulatory Visit (INDEPENDENT_AMBULATORY_CARE_PROVIDER_SITE_OTHER): Payer: 59 | Admitting: Orthopaedic Surgery

## 2016-07-30 ENCOUNTER — Encounter (INDEPENDENT_AMBULATORY_CARE_PROVIDER_SITE_OTHER): Payer: Self-pay

## 2016-07-30 ENCOUNTER — Ambulatory Visit (INDEPENDENT_AMBULATORY_CARE_PROVIDER_SITE_OTHER): Payer: 59 | Admitting: Orthopaedic Surgery

## 2016-08-01 ENCOUNTER — Ambulatory Visit (INDEPENDENT_AMBULATORY_CARE_PROVIDER_SITE_OTHER): Payer: 59 | Admitting: Orthopaedic Surgery

## 2016-08-01 ENCOUNTER — Ambulatory Visit (INDEPENDENT_AMBULATORY_CARE_PROVIDER_SITE_OTHER): Payer: 59

## 2016-08-01 ENCOUNTER — Encounter (INDEPENDENT_AMBULATORY_CARE_PROVIDER_SITE_OTHER): Payer: Self-pay | Admitting: Orthopaedic Surgery

## 2016-08-01 VITALS — BP 140/84 | HR 116 | Resp 14 | Ht 62.0 in | Wt 160.0 lb

## 2016-08-01 DIAGNOSIS — Z96641 Presence of right artificial hip joint: Secondary | ICD-10-CM | POA: Diagnosis not present

## 2016-08-01 NOTE — Progress Notes (Signed)
Office Visit Note   Patient: Rose Campbell           Date of Birth: April 11, 1985           MRN: [id number] Visit Date: 08/01/2016              Requested by: Darreld Mclean, MD [address] [place], [place] [address] PCP: Darreld Mclean, MD   Assessment & Plan: Visit Diagnoses:  1. Status post right hip replacement   Myositis ossificans right hip which I believe is causing her discomfort  Plan: Suhey has a history of lupus. She's presently on Humira and prednisone. She cannot take NSAIDs. Like her to try Tylenol rather than try an addictive medicine. Hopefully this will slowly resolve over time  Follow-Up Instructions: Return in about 6 months (around 02/01/2017).   Orders:  Orders Placed This Encounter  Procedures  . XR Pelvis 1-2 Views   No orders of the defined types were placed in this encounter.     Procedures: No procedures performed   Clinical Data: No additional findings.   Subjective: Chief Complaint  Patient presents with  . Right Hip - Routine Post Op    Ms. Rose Campbell is 2 months status post R hip replacement. She relates this hip has been more difficult to manage that the Left THA. She relates she is having trouble sleeping.  Mrs. Rose Campbell is not having any problem with her left total hip replacement. She does note that her right total hip replacement has been a bit slower intercourse. She does have some aches and pains in the area of her groin. She doesn't have any sensation of her  hip  "slipping out of place." No numbness or tingling. Denies any fever or chills  HPI  Review of Systems   Objective: Vital Signs: BP 140/84   Pulse (!) 116   Resp 14   Ht 5\' 2"  (1.575 m)   Wt 160 lb (72.6 kg)   BMI 29.26 kg/m   Physical Exam  Ortho Exam still uses a cane for ambulation. Slight limp on the right lower extremity. Some discomfort with internal/external rotation of her right hip compared to left. Does have chronic swelling of both  of her knees apparently related to her lupus. Have her feet. Good pulses.  Specialty Comments:  No specialty comments available.  Imaging: Xr Pelvis 1-2 Views  Result Date: 08/01/2016 AP the pelvis was obtained demonstrating myositis ossificans about the right hip which is more obvious now than on her previous films. She does have some mild myositis ossificans interval left total hip replacement. The components are in excellent position without evidence of loosening or malposition.    PMFS History: Patient Active Problem List   Diagnosis Date Noted  . Unilateral primary osteoarthritis, right hip 03/05/2016  . Osteoarthritis of left hip 08/03/2015  . 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 Latex allergy 08/01/2015  . Weight gain due to medication 10/19/2014  . Depression with anxiety 08/06/2013  . Uses oral contraception 07/01/2012  . Lupus 08/09/2011  . Joint pain 07/23/2011   Past Medical History:  Diagnosis Date  . Arthritis   . Depression    takes Cymbalta daily  . Joint pain   . Joint swelling   . Lupus   . Polyarthralgia    takes Humira and Methotrexate  . Seizures ([place])    hx of-as a child. No meds    History reviewed. No pertinent family history.  Past Surgical History:  Procedure Laterality Date  . TOTAL HIP ARTHROPLASTY Left 08/03/2015   Procedure: LEFT TOTAL HIP ARTHROPLASTY;  Surgeon: Garald Balding, MD;  Location: [facility];  Service: Orthopedics;  Laterality: Left;  . TOTAL HIP ARTHROPLASTY Right 03/05/2016   Procedure: TOTAL HIP ARTHROPLASTY;  Surgeon: Garald Balding, MD;  Location: [facility];  Service: Orthopedics;  Laterality: Right;   Social History   Occupational History  . Not on file.   Social History Main Topics  . Smoking status: Never Smoker  . Smokeless tobacco: Never Used  . Alcohol use No  . Drug use: No  . Sexual activity: Yes    Birth control/ protection: Pill     Garald Balding, MD   Note - This record has been created using  [employer].  Chart creation errors have been sought, but may not always  have been located. Such creation errors do not reflect on  the standard of medical care.

## 2016-08-25 ENCOUNTER — Encounter: Payer: Self-pay | Admitting: Family Medicine

## 2016-08-26 ENCOUNTER — Other Ambulatory Visit: Payer: Self-pay | Admitting: Emergency Medicine

## 2016-08-26 DIAGNOSIS — F329 Major depressive disorder, single episode, unspecified: Secondary | ICD-10-CM

## 2016-08-26 DIAGNOSIS — F32A Depression, unspecified: Secondary | ICD-10-CM

## 2016-08-26 MED ORDER — DULOXETINE HCL 60 MG PO CPEP: ORAL_CAPSULE | ORAL | 1 refills | Status: DC

## 2016-08-28 ENCOUNTER — Telehealth: Payer: Self-pay | Admitting: Rheumatology

## 2016-08-28 NOTE — Telephone Encounter (Signed)
Patient having a lot of pain for a few weeks now. Patient having pain in elbows, wrists, fingers, knees. Patient wants to know what to do. Tylenol not helping.

## 2016-08-29 NOTE — Telephone Encounter (Signed)
Sounds like she is having a flare of her arthritis with multiple joint arthralgias-check with rheumatologist first

## 2016-08-29 NOTE — Telephone Encounter (Signed)
Pt has a RA doctor she is seeing. Please advise

## 2016-08-30 NOTE — Telephone Encounter (Signed)
Called pt and told her what PW suggested. Her appt is not until October and she is in pain. I told her to call RA doctor back late Friday and early Monday to check for cancellations

## 2016-09-03 NOTE — Progress Notes (Deleted)
[place] at [facility] [address] [place], [place] [address] [phone]  Date:  09/04/2016   Name:  Lajada Janes   DOB:  1985-11-18   MRN:  [id number]  PCP:  Darreld Mclean, MD    Chief Complaint: No chief complaint on file.   History of Present Illness:  Celese Dickison is a 31 y.o. very pleasant female patient who presents with the following:  History of lupus and associated degenerative joint changes.  She has had bilateral hip replacements in the recent past.  Left in 2017 and right in February of this year Her rheumatologist is with [facility] Rheumatology, Dr. Orvil Feil, MD - 04/29/2016 6:45 PM EDT [name] is here for f/u Spondyloarthritis, inflammatory arthritis, therapy monitoring  GYN care?  She may be due for a pap   Patient Active Problem List   Diagnosis Date Noted  . Unilateral primary osteoarthritis, right hip 03/05/2016  . Osteoarthritis of left hip 08/03/2015  . 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 Latex allergy 08/01/2015  . Weight gain due to medication 10/19/2014  . Depression with anxiety 08/06/2013  . Uses oral contraception 07/01/2012  . Lupus 08/09/2011  . Joint pain 07/23/2011    Past Medical History:  Diagnosis Date  . Arthritis   . Depression    takes Cymbalta daily  . Joint pain   . Joint swelling   . Lupus   . Polyarthralgia    takes Humira and Methotrexate  . Seizures ([place])    hx of-as a child. No meds    Past Surgical History:  Procedure Laterality Date  . TOTAL HIP ARTHROPLASTY Left 08/03/2015   Procedure: LEFT TOTAL HIP ARTHROPLASTY;  Surgeon: Garald Balding, MD;  Location: [place];  Service: Orthopedics;  Laterality: Left;  . TOTAL HIP ARTHROPLASTY Right 03/05/2016   Procedure: TOTAL HIP ARTHROPLASTY;  Surgeon: Garald Balding, MD;  Location: [place];  Service: Orthopedics;  Laterality: Right;    Social History  Substance Use Topics  .  Smoking status: Never Smoker  . Smokeless tobacco: Never Used  . Alcohol use No    No family history on file.  Allergies  Allergen Reactions  . Latex Hives    Medication list has been reviewed and updated.  Current Outpatient Prescriptions on File Prior to Visit  Medication Sig Dispense Refill  . DULoxetine (CYMBALTA) 60 MG capsule TAKE 1 CAPSULE BY MOUTH TWO TIMES DAILY 60 capsule 1  . HUMIRA PEN 40 MG/0.8ML PNKT     . HYDROcodone-acetaminophen (NORCO/VICODIN) 5-325 MG tablet TAKE 1 TABLET BY MOUTH TWICE A DAY AS NEEDED FOR CHRONIC ARTHRITIS PAIN  0  . methocarbamol (ROBAXIN) 500 MG tablet Take 1 tablet (500 mg total) by mouth every 12 (twelve) hours as needed for muscle spasms. (Patient not taking: Reported on 05/03/2016) 30 tablet 0  . Norgestim-Eth Estrad Triphasic (ORTHO TRI-CYCLEN LO PO) Take 1 tablet by mouth daily.     Marland Kitchen oxyCODONE (OXY IR/ROXICODONE) 5 MG immediate release tablet Take 1-2 tablets (5-10 mg total) by mouth every 4 (four) hours as needed for severe pain. (Patient not taking: Reported on 08/01/2016) 60 tablet 0  . oxyCODONE (OXY IR/ROXICODONE) 5 MG immediate release tablet Take 1-2 tablets (5-10 mg total) by mouth every 12 (twelve) hours as needed for breakthrough pain. (Patient not taking: Reported on 05/03/2016) 30 tablet 0  . predniSONE (DELTASONE) 10 MG tablet Take 1 tablet (10  mg total) by mouth daily with breakfast. 30 tablet 1  . rivaroxaban (XARELTO) 10 MG TABS tablet Take 1 tablet (10 mg total) by mouth daily with breakfast. (Patient not taking: Reported on 05/03/2016) 30 tablet 0  . TRINESSA, 28, 0.18/0.215/0.25 MG-35 MCG tablet Take 1 tablet by mouth daily.  0   No current facility-administered medications on file prior to visit.     Review of Systems:  As per HPI- otherwise negative.   Physical Examination: There were no vitals filed for this visit. There were no vitals filed for this visit. There is no height or weight on file to calculate  BMI. Ideal Body Weight:    GEN: WDWN, NAD, Non-toxic, A & O x 3 HEENT: Atraumatic, Normocephalic. Neck supple. No masses, No LAD. Ears and Nose: No external deformity. CV: RRR, No M/G/R. No JVD. No thrill. No extra heart sounds. PULM: CTA B, no wheezes, crackles, rhonchi. No retractions. No resp. distress. No accessory muscle use. ABD: S, NT, ND, +BS. No rebound. No HSM. EXTR: No c/c/e NEURO Normal gait.  PSYCH: Normally interactive. Conversant. Not depressed or anxious appearing.  Calm demeanor.    Assessment and Plan: ***  Signed Lamar Blinks, MD

## 2016-09-04 ENCOUNTER — Ambulatory Visit: Payer: Self-pay | Admitting: Family Medicine

## 2016-09-04 ENCOUNTER — Encounter: Payer: Self-pay | Admitting: Family Medicine

## 2016-09-04 DIAGNOSIS — Z0289 Encounter for other administrative examinations: Secondary | ICD-10-CM

## 2016-09-05 ENCOUNTER — Telehealth (INDEPENDENT_AMBULATORY_CARE_PROVIDER_SITE_OTHER): Payer: Self-pay | Admitting: Orthopaedic Surgery

## 2016-09-05 NOTE — Telephone Encounter (Signed)
08/01/2016 OV NOTE FAXED TO [place] [phone]

## 2016-09-13 ENCOUNTER — Telehealth (INDEPENDENT_AMBULATORY_CARE_PROVIDER_SITE_OTHER): Payer: Self-pay | Admitting: Orthopaedic Surgery

## 2016-09-13 ENCOUNTER — Other Ambulatory Visit (INDEPENDENT_AMBULATORY_CARE_PROVIDER_SITE_OTHER): Payer: Self-pay | Admitting: Orthopedic Surgery

## 2016-09-13 DIAGNOSIS — D8989 Other specified disorders involving the immune mechanism, not elsewhere classified: Secondary | ICD-10-CM

## 2016-09-13 DIAGNOSIS — M329 Systemic lupus erythematosus, unspecified: Secondary | ICD-10-CM

## 2016-09-13 NOTE — Telephone Encounter (Signed)
Patient called stating [employer] had not receive her records that I faxed on 8/16. I called her back and told her that I would re fax [phone].

## 2016-11-05 ENCOUNTER — Other Ambulatory Visit: Payer: Self-pay | Admitting: Emergency Medicine

## 2016-11-05 DIAGNOSIS — F32A Depression, unspecified: Secondary | ICD-10-CM

## 2016-11-05 DIAGNOSIS — M329 Systemic lupus erythematosus, unspecified: Secondary | ICD-10-CM

## 2016-11-05 DIAGNOSIS — D8989 Other specified disorders involving the immune mechanism, not elsewhere classified: Secondary | ICD-10-CM

## 2016-11-05 DIAGNOSIS — F329 Major depressive disorder, single episode, unspecified: Secondary | ICD-10-CM

## 2016-11-05 MED ORDER — PREDNISONE 10 MG PO TABS: 10.0000 mg | ORAL_TABLET | Freq: Every day | ORAL | 1 refills | Status: DC

## 2016-11-05 MED ORDER — DULOXETINE HCL 60 MG PO CPEP: ORAL_CAPSULE | ORAL | 1 refills | Status: DC

## 2016-11-12 ENCOUNTER — Encounter: Payer: Self-pay | Admitting: Family Medicine

## 2016-11-13 ENCOUNTER — Other Ambulatory Visit: Payer: Self-pay | Admitting: Emergency Medicine

## 2016-11-13 DIAGNOSIS — M329 Systemic lupus erythematosus, unspecified: Secondary | ICD-10-CM

## 2016-11-13 DIAGNOSIS — F329 Major depressive disorder, single episode, unspecified: Secondary | ICD-10-CM

## 2016-11-13 DIAGNOSIS — D8989 Other specified disorders involving the immune mechanism, not elsewhere classified: Secondary | ICD-10-CM

## 2016-11-13 DIAGNOSIS — F32A Depression, unspecified: Secondary | ICD-10-CM

## 2016-11-13 MED ORDER — PREDNISONE 10 MG PO TABS: 10.0000 mg | ORAL_TABLET | Freq: Every day | ORAL | 1 refills | Status: DC

## 2016-11-13 MED ORDER — DULOXETINE HCL 60 MG PO CPEP: ORAL_CAPSULE | ORAL | 1 refills | Status: DC

## 2016-11-28 ENCOUNTER — Telehealth: Payer: Self-pay

## 2016-11-28 NOTE — Telephone Encounter (Signed)
Patient called stating that she is needing a Rx for an antibiotic before her dentist appt. Monday.  Cb# [phone].  Please advise.  Thank you

## 2016-11-29 ENCOUNTER — Other Ambulatory Visit (INDEPENDENT_AMBULATORY_CARE_PROVIDER_SITE_OTHER): Payer: Self-pay

## 2016-11-29 MED ORDER — CEPHALEXIN 500 MG PO CAPS: 500.0000 mg | ORAL_CAPSULE | Freq: Four times a day (QID) | ORAL | 0 refills | Status: DC

## 2016-11-29 NOTE — Telephone Encounter (Signed)
Called pt.

## 2016-12-10 ENCOUNTER — Other Ambulatory Visit: Payer: Self-pay | Admitting: Family Medicine

## 2016-12-10 DIAGNOSIS — F32A Depression, unspecified: Secondary | ICD-10-CM

## 2016-12-10 DIAGNOSIS — F329 Major depressive disorder, single episode, unspecified: Secondary | ICD-10-CM

## 2017-01-03 ENCOUNTER — Ambulatory Visit (INDEPENDENT_AMBULATORY_CARE_PROVIDER_SITE_OTHER): Payer: 59 | Admitting: Orthopaedic Surgery

## 2017-01-03 ENCOUNTER — Telehealth: Payer: Self-pay

## 2017-01-03 NOTE — Telephone Encounter (Signed)
Calling concerning appt.

## 2017-01-29 ENCOUNTER — Telehealth: Payer: Self-pay | Admitting: *Deleted

## 2017-01-29 NOTE — Telephone Encounter (Signed)
Received request for Medical records from [place], forwarded to [place] for email/scan/SLS 01/09

## 2017-02-02 NOTE — Progress Notes (Deleted)
[place] at [facility] [address], [place], [place] [address] [phone]  Date:  02/05/2017   Name:  Rose Campbell   DOB:  1985/06/09   MRN:  [id number]  PCP:  Darreld Mclean, MD    Chief Complaint: No chief complaint on file.   History of Present Illness:  Kevona Soulliere is a 32 y.o. very pleasant female patient who presents with the following:  Young patient with lupus and associated arthritis-  Here today with concern about her medication ?who is her rheumatologist now  Patient Active Problem List   Diagnosis Date Noted  . Unilateral primary osteoarthritis, right hip 03/05/2016  . Osteoarthritis of left hip 08/03/2015  . 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 Latex allergy 08/01/2015  . Weight gain due to medication 10/19/2014  . Depression with anxiety 08/06/2013  . Uses oral contraception 07/01/2012  . Lupus 08/09/2011  . Joint pain 07/23/2011    Past Medical History:  Diagnosis Date  . Arthritis   . Depression    takes Cymbalta daily  . Joint pain   . Joint swelling   . Lupus   . Polyarthralgia    takes Humira and Methotrexate  . Seizures ([place])    hx of-as a child. No meds    Past Surgical History:  Procedure Laterality Date  . TOTAL HIP ARTHROPLASTY Left 08/03/2015   Procedure: LEFT TOTAL HIP ARTHROPLASTY;  Surgeon: Garald Balding, MD;  Location: [place];  Service: Orthopedics;  Laterality: Left;  . TOTAL HIP ARTHROPLASTY Right 03/05/2016   Procedure: TOTAL HIP ARTHROPLASTY;  Surgeon: Garald Balding, MD;  Location: [place];  Service: Orthopedics;  Laterality: Right;    Social History   Tobacco Use  . Smoking status: Never Smoker  . Smokeless tobacco: Never Used  Substance Use Topics  . Alcohol use: No  . Drug use: No    No family history on file.  Allergies  Allergen Reactions  . Latex Hives    Medication list has been reviewed and updated.  Current Outpatient Medications on  File Prior to Visit  Medication Sig Dispense Refill  . cephALEXin (KEFLEX) 500 MG capsule Take 1 capsule (500 mg total) 4 (four) times daily by mouth. Take 4 tablets prior to any dental procedure. 16 capsule 0  . DULoxetine (CYMBALTA) 60 MG capsule TAKE 1 CAPSULE BY MOUTH TWO TIMES DAILY 60 capsule 1  . DULoxetine (CYMBALTA) 60 MG capsule TAKE 1 CAPSULE BY MOUTH TWO TIMES DAILY 60 capsule 1  . HUMIRA PEN 40 MG/0.8ML PNKT     . HYDROcodone-acetaminophen (NORCO/VICODIN) 5-325 MG tablet TAKE 1 TABLET BY MOUTH TWICE A DAY AS NEEDED FOR CHRONIC ARTHRITIS PAIN  0  . methocarbamol (ROBAXIN) 500 MG tablet Take 1 tablet (500 mg total) by mouth every 12 (twelve) hours as needed for muscle spasms. (Patient not taking: Reported on 05/03/2016) 30 tablet 0  . Norgestim-Eth Estrad Triphasic (ORTHO TRI-CYCLEN LO PO) Take 1 tablet by mouth daily.     Marland Kitchen oxyCODONE (OXY IR/ROXICODONE) 5 MG immediate release tablet Take 1-2 tablets (5-10 mg total) by mouth every 4 (four) hours as needed for severe pain. (Patient not taking: Reported on 08/01/2016) 60 tablet 0  . oxyCODONE (OXY IR/ROXICODONE) 5 MG immediate release tablet Take 1-2 tablets (5-10 mg total) by mouth every 12 (twelve) hours as needed for breakthrough pain. (Patient not taking: Reported on 05/03/2016) 30 tablet 0  . predniSONE (DELTASONE) 10  MG tablet Take 1 tablet (10 mg total) by mouth daily with breakfast. 30 tablet 1  . rivaroxaban (XARELTO) 10 MG TABS tablet Take 1 tablet (10 mg total) by mouth daily with breakfast. (Patient not taking: Reported on 05/03/2016) 30 tablet 0  . TRINESSA, 28, 0.18/0.215/0.25 MG-35 MCG tablet Take 1 tablet by mouth daily.  0   No current facility-administered medications on file prior to visit.     Review of Systems:  As per HPI- otherwise negative.   Physical Examination: There were no vitals filed for this visit. There were no vitals filed for this visit. There is no height or weight on file to calculate BMI. Ideal  Body Weight:    GEN: WDWN, NAD, Non-toxic, A & O x 3 HEENT: Atraumatic, Normocephalic. Neck supple. No masses, No LAD. Ears and Nose: No external deformity. CV: RRR, No M/G/R. No JVD. No thrill. No extra heart sounds. PULM: CTA B, no wheezes, crackles, rhonchi. No retractions. No resp. distress. No accessory muscle use. ABD: S, NT, ND, +BS. No rebound. No HSM. EXTR: No c/c/e NEURO Normal gait.  PSYCH: Normally interactive. Conversant. Not depressed or anxious appearing.  Calm demeanor.    Assessment and Plan: ***  Signed Lamar Blinks, MD

## 2017-02-05 ENCOUNTER — Ambulatory Visit: Payer: Self-pay | Admitting: Family Medicine

## 2017-02-06 ENCOUNTER — Encounter (INDEPENDENT_AMBULATORY_CARE_PROVIDER_SITE_OTHER): Payer: Self-pay | Admitting: Orthopaedic Surgery

## 2017-02-06 ENCOUNTER — Ambulatory Visit (INDEPENDENT_AMBULATORY_CARE_PROVIDER_SITE_OTHER): Payer: 59 | Admitting: Orthopaedic Surgery

## 2017-02-06 VITALS — BP 135/100 | HR 80 | Resp 12 | Ht 61.0 in | Wt 161.0 lb

## 2017-02-06 DIAGNOSIS — Z96641 Presence of right artificial hip joint: Secondary | ICD-10-CM

## 2017-02-06 NOTE — Progress Notes (Signed)
Office Visit Note   Patient: Rose Campbell           Date of Birth: 1985/11/17           MRN: [id number] Visit Date: 02/06/2017              Requested by: Darreld Mclean, MD [address] [place], [place] [address] PCP: Darreld Mclean, MD   Assessment & Plan: Visit Diagnoses:  1. Status post right hip replacement     Plan: 11 months status post primary right total hip replacement doing relatively well. As history of ankylosing spondylitis and is presently on 5 mg of prednisone every other day, Humira and sulfasalazine. Does use a cane for ambulation. Still having some discomfort on the right hip. 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 Urge her to continue with her exercises meds and I will see her in 6 months. Might be worthwhile repeating films at that time Follow-Up Instructions: Return in about 6 months (around 08/06/2017).   Orders:  No orders of the defined types were placed in this encounter.  No orders of the defined types were placed in this encounter.     Procedures: No procedures performed   Clinical Data: No additional findings.   Subjective: Chief Complaint  Patient presents with  . Right Hip - Pain    Rose Campbell is a 31 y o S/P 11 mo. Right hip arthroplasty. She relates still having pain in the Right hip, not the left hip. Ambulates with a cane. Rheumatologist said arthritis is now in bilateral hands, not sure why.   Still having some discomfort and decreased motion of her right hip but definitely better than she was preoperatively. 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 She's had a prior left total hip replacement with evidence of very mild myositis. There is significantly more on her films of the right hip. I'm sure that's causing a lot of her discomfort. She presently is on Humira, 5 mg of  prednisone every other day and sulfasalazine for her ankylosing spondylitis no numbness or tingling.  HPI  Review of Systems  Constitutional: Positive for fatigue. Negative for chills and fever.  Eyes: Negative for itching.  Respiratory: Negative for chest tightness and shortness of breath.   Cardiovascular: Negative for chest pain, palpitations and leg swelling.  Gastrointestinal: Negative for blood in stool, constipation and diarrhea.  Endocrine: Negative for polyuria.  Genitourinary: Negative for dysuria.  Musculoskeletal: Positive for back pain and joint swelling. Negative for neck pain and neck stiffness.  Allergic/Immunologic: Negative for immunocompromised state.  Neurological: Negative for dizziness and numbness.  Hematological: Does not bruise/bleed easily.  Psychiatric/Behavioral: Positive for sleep disturbance. The patient is not nervous/anxious.      Objective: Vital Signs: BP (!) 135/100   Pulse 80   Resp 12   Ht 5\' 1"  (1.549 m)   Wt 161 lb (73 kg)   BMI 30.42 kg/m   Physical Exam  Ortho Exam awake alert and oriented 3. Comfortable sitting painless range of motion of both hips with internal/external rotation slightly less motion on her right than her left hip. The thigh was not swollen or hot. Neurovascular exam appears to be intact. Incision is intact. No localizers of tenderness just some generalized "achiness". Straight leg raise negative.  Specialty Comments:  No specialty comments available.  Imaging:  No results found.   PMFS History: Patient Active Problem List   Diagnosis Date Noted  . Unilateral primary osteoarthritis, right hip 03/05/2016  . Osteoarthritis of left hip 08/03/2015  . 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 Latex allergy 08/01/2015  . Weight gain due to medication 10/19/2014  . Depression with anxiety 08/06/2013  . Uses oral contraception 07/01/2012  . Lupus 08/09/2011  . Joint pain 07/23/2011   Past Medical History:    Diagnosis Date  . Arthritis   . Depression    takes Cymbalta daily  . Joint pain   . Joint swelling   . Lupus   . Polyarthralgia    takes Humira and Methotrexate  . Seizures ([place])    hx of-as a child. No meds    History reviewed. No pertinent family history.  Past Surgical History:  Procedure Laterality Date  . TOTAL HIP ARTHROPLASTY Left 08/03/2015   Procedure: LEFT TOTAL HIP ARTHROPLASTY;  Surgeon: Garald Balding, MD;  Location: [place];  Service: Orthopedics;  Laterality: Left;  . TOTAL HIP ARTHROPLASTY Right 03/05/2016   Procedure: TOTAL HIP ARTHROPLASTY;  Surgeon: Garald Balding, MD;  Location: [place];  Service: Orthopedics;  Laterality: Right;   Social History   Occupational History  . Not on file  Tobacco Use  . Smoking status: Never Smoker  . Smokeless tobacco: Never Used  Substance and Sexual Activity  . Alcohol use: No  . Drug use: No  . Sexual activity: Yes    Birth control/protection: Pill

## 2017-02-07 ENCOUNTER — Ambulatory Visit (INDEPENDENT_AMBULATORY_CARE_PROVIDER_SITE_OTHER): Payer: 59 | Admitting: Orthopaedic Surgery

## 2017-02-08 NOTE — Progress Notes (Deleted)
[place] at [facility] [address] [place], [place] [address] [phone]  Date:  02/10/2017   Name:  Brylie Sneath   DOB:  January 26, 1985   MRN:  [id number]  PCP:  Darreld Mclean, MD    Chief Complaint: No chief complaint on file.   History of Present Illness:  Mehek Miles is a 32 y.o. very pleasant female patient who presents with the following:  I last saw her about a year ago:  History of lupus which has caused significant joint damage at a young age.  She had her left hip replaced last summer and this has done well. She has much less left hip pain since her operation. However she is now limited by pain in her RIGHT hip and would like to have this one replaced as well.  Dr. Durward Fortes at [facility] ortho will do this procedure for her  She was noted to have hypokalemia in November- however this had normalized by last labs on 12/13.   She is on cymbalta, prednisone, pain medication as needed and OCP  Pap: Flu:  Patient Active Problem List   Diagnosis Date Noted  . Unilateral primary osteoarthritis, right hip 03/05/2016  . Osteoarthritis of left hip 08/03/2015  . 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 Latex allergy 08/01/2015  . Weight gain due to medication 10/19/2014  . Depression with anxiety 08/06/2013  . Uses oral contraception 07/01/2012  . Lupus 08/09/2011  . Joint pain 07/23/2011    Past Medical History:  Diagnosis Date  . Arthritis   . Depression    takes Cymbalta daily  . Joint pain   . Joint swelling   . Lupus   . Polyarthralgia    takes Humira and Methotrexate  . Seizures ([place])    hx of-as a child. No meds    Past Surgical History:  Procedure Laterality Date  . TOTAL HIP ARTHROPLASTY Left 08/03/2015   Procedure: LEFT TOTAL HIP ARTHROPLASTY;  Surgeon: Garald Balding, MD;  Location: [place];  Service: Orthopedics;  Laterality: Left;  . TOTAL HIP ARTHROPLASTY Right 03/05/2016   Procedure:  TOTAL HIP ARTHROPLASTY;  Surgeon: Garald Balding, MD;  Location: [place];  Service: Orthopedics;  Laterality: Right;    Social History   Tobacco Use  . Smoking status: Never Smoker  . Smokeless tobacco: Never Used  Substance Use Topics  . Alcohol use: No  . Drug use: No    No family history on file.  Allergies  Allergen Reactions  . Latex Hives    Medication list has been reviewed and updated.  Current Outpatient Medications on File Prior to Visit  Medication Sig Dispense Refill  . cephALEXin (KEFLEX) 500 MG capsule Take 1 capsule (500 mg total) 4 (four) times daily by mouth. Take 4 tablets prior to any dental procedure. 16 capsule 0  . DULoxetine (CYMBALTA) 60 MG capsule TAKE 1 CAPSULE BY MOUTH TWO TIMES DAILY 60 capsule 1  . DULoxetine (CYMBALTA) 60 MG capsule TAKE 1 CAPSULE BY MOUTH TWO TIMES DAILY 60 capsule 1  . HUMIRA PEN 40 MG/0.8ML PNKT     . HYDROcodone-acetaminophen (NORCO/VICODIN) 5-325 MG tablet TAKE 1 TABLET BY MOUTH TWICE A DAY AS NEEDED FOR CHRONIC ARTHRITIS PAIN  0  . methocarbamol (ROBAXIN) 500 MG tablet Take 1 tablet (500 mg total) by mouth every 12 (twelve) hours as needed for muscle spasms. (Patient not taking: Reported on 02/06/2017) 30 tablet 0  .  Norgestim-Eth Estrad Triphasic (ORTHO TRI-CYCLEN LO PO) Take 1 tablet by mouth daily.     Marland Kitchen oxyCODONE (OXY IR/ROXICODONE) 5 MG immediate release tablet Take 1-2 tablets (5-10 mg total) by mouth every 4 (four) hours as needed for severe pain. (Patient not taking: Reported on 02/06/2017) 60 tablet 0  . oxyCODONE (OXY IR/ROXICODONE) 5 MG immediate release tablet Take 1-2 tablets (5-10 mg total) by mouth every 12 (twelve) hours as needed for breakthrough pain. (Patient not taking: Reported on 02/06/2017) 30 tablet 0  . predniSONE (DELTASONE) 10 MG tablet Take 1 tablet (10 mg total) by mouth daily with breakfast. (Patient taking differently: Take 5 mg by mouth every other day. ) 30 tablet 1  . rivaroxaban (XARELTO) 10 MG  TABS tablet Take 1 tablet (10 mg total) by mouth daily with breakfast. (Patient not taking: Reported on 02/06/2017) 30 tablet 0  . sulfaSALAzine (AZULFIDINE) 500 MG tablet Take by mouth.    [name], 28, 0.18/0.215/0.25 MG-35 MCG tablet Take 1 tablet by mouth daily.  0   No current facility-administered medications on file prior to visit.     Review of Systems:  As per HPI- otherwise negative.   Physical Examination: There were no vitals filed for this visit. There were no vitals filed for this visit. There is no height or weight on file to calculate BMI. Ideal Body Weight:    GEN: WDWN, NAD, Non-toxic, A & O x 3 HEENT: Atraumatic, Normocephalic. Neck supple. No masses, No LAD. Ears and Nose: No external deformity. CV: RRR, No M/G/R. No JVD. No thrill. No extra heart sounds. PULM: CTA B, no wheezes, crackles, rhonchi. No retractions. No resp. distress. No accessory muscle use. ABD: S, NT, ND, +BS. No rebound. No HSM. EXTR: No c/c/e NEURO Normal gait.  PSYCH: Normally interactive. Conversant. Not depressed or anxious appearing.  Calm demeanor.    Assessment and Plan: ***  Signed Lamar Blinks, MD

## 2017-02-10 ENCOUNTER — Ambulatory Visit: Payer: 59 | Admitting: Family Medicine

## 2017-02-10 DIAGNOSIS — Z0289 Encounter for other administrative examinations: Secondary | ICD-10-CM

## 2017-02-24 ENCOUNTER — Telehealth (INDEPENDENT_AMBULATORY_CARE_PROVIDER_SITE_OTHER): Payer: Self-pay | Admitting: Orthopaedic Surgery

## 2017-02-24 NOTE — Telephone Encounter (Signed)
Called pt and advised her to see RA doctor.

## 2017-02-24 NOTE — Telephone Encounter (Signed)
Patient called stating that her left wrist is very painful and swollen and she "can hear the bones rubbing together."  Patient states she is unable to drive because both hands and wrists are too sore.  She wants to know what you can take for the pain.

## 2017-03-12 ENCOUNTER — Other Ambulatory Visit: Payer: Self-pay | Admitting: Family Medicine

## 2017-03-12 DIAGNOSIS — D8989 Other specified disorders involving the immune mechanism, not elsewhere classified: Secondary | ICD-10-CM

## 2017-03-12 DIAGNOSIS — M329 Systemic lupus erythematosus, unspecified: Secondary | ICD-10-CM

## 2017-03-13 ENCOUNTER — Encounter: Payer: Self-pay | Admitting: Family Medicine

## 2017-03-13 DIAGNOSIS — F329 Major depressive disorder, single episode, unspecified: Secondary | ICD-10-CM

## 2017-03-13 DIAGNOSIS — M329 Systemic lupus erythematosus, unspecified: Secondary | ICD-10-CM

## 2017-03-13 DIAGNOSIS — D8989 Other specified disorders involving the immune mechanism, not elsewhere classified: Secondary | ICD-10-CM

## 2017-03-13 DIAGNOSIS — F32A Depression, unspecified: Secondary | ICD-10-CM

## 2017-03-14 MED ORDER — PREDNISONE 10 MG PO TABS: 10.0000 mg | ORAL_TABLET | Freq: Every day | ORAL | 3 refills | Status: DC

## 2017-03-14 MED ORDER — DULOXETINE HCL 60 MG PO CPEP: 60.0000 mg | ORAL_CAPSULE | Freq: Two times a day (BID) | ORAL | 3 refills | Status: DC

## 2017-03-17 ENCOUNTER — Telehealth: Payer: Self-pay | Admitting: Family Medicine

## 2017-03-17 NOTE — Telephone Encounter (Signed)
Copied from [place] [phone]. Topic: Quick Communication - Rx Refill/Question >> Mar 17, 2017  3:17 PM Scherrie Gerlach wrote: Medication: predniSONE (DELTASONE) 10 MG tablet [facility] needs to clarify does this 10 mg replace what pt used to be on, which was a 5 mg daily.(prescribed by another dr on 12/06/2016)   new rx sent in 03/14/17. Ref no: [id number] [phone]

## 2017-03-18 ENCOUNTER — Telehealth: Payer: Self-pay

## 2017-03-18 NOTE — Telephone Encounter (Signed)
Please advise in PCP absence.  

## 2017-03-18 NOTE — Telephone Encounter (Signed)
Spoke w/ [facility], Pt to be on Prednisone 10mg .

## 2017-03-18 NOTE — Telephone Encounter (Signed)
Yes, she has been prescribed prednisone 10 mg

## 2017-03-18 NOTE — Telephone Encounter (Signed)
Copied from [place] [id number]. Topic: Quick Communication - Rx Refill/Question >> Mar 17, 2017  3:17 PM Scherrie Gerlach wrote: Medication: predniSONE (DELTASONE) 10 MG tablet [facility] needs to clarify does this 10 mg replace what pt used to be on, which was a 5 mg daily.(prescribed by another dr on 12/06/2016)   Ref no: [id number] [phone]   >> Mar 18, 2017  9:03 AM Carolyn Stare wrote: [place] with [facility] call to say this is there  final attempt to clarify the below   predniSONE (DELTASONE) 10 MG tablet    [phone]  RJP [phone]

## 2017-05-01 ENCOUNTER — Telehealth (INDEPENDENT_AMBULATORY_CARE_PROVIDER_SITE_OTHER): Payer: Self-pay | Admitting: Orthopaedic Surgery

## 2017-05-01 NOTE — Telephone Encounter (Signed)
Left message to let them lknow we have not received those papers yet and to re fax them to [phone]

## 2017-05-01 NOTE — Telephone Encounter (Signed)
Cindy from [employer] left a voicemail to confirm if we received the attending physician's statement and and if Dr. Durward Fortes will be able to fill it out.  Please call back [phone] ext [phone]

## 2017-06-06 ENCOUNTER — Encounter: Payer: Self-pay | Admitting: Family Medicine

## 2017-06-11 ENCOUNTER — Encounter: Payer: Self-pay | Admitting: Family Medicine

## 2017-06-19 ENCOUNTER — Encounter: Payer: Self-pay | Admitting: Family Medicine

## 2017-06-21 NOTE — Progress Notes (Signed)
[place] at [facility] [address], [place], [place] [address] [phone]  Date:  06/23/2017   Name:  Rose Campbell   DOB:  11-26-1985   MRN:  [id number]  PCP:  Rose Mclean, MD    Chief Complaint: Depression (worsening over last few months); Anxiety; and Insomnia   History of Present Illness:  Rose Campbell is a 32 y.o. very pleasant female patient who presents with the following:  Patient who I know well but had not seen since 1/18 History of lupus s/p bilateral hip replacements  Pt notes that "I thought life and stuff would be better since surgery." Her 2nd hip is still painful, and she is not sleeping well, she still has a very hard time getting around Her insomnia seems to be due to pain and also restlessness, anxiety She notes that some other joints are also painful such as her hands She has hard time driving, opening things, writing Her hands bothering her is more recent  Her lupus doc is with [facility] Dr. Earnest Conroy- per her last note 3/19: Rose Campbell is here for f/u spondyloarthritis,ankylosing spondylitis, therapy monitoring  They increased her dosages of Humira, sulfasalazine in hopes of improving her sx  She is on pred as needed - 5 mg qod generally  She has lost some weight, but is frustrated as her function is not better She is using her cane to walk  She is embarrassed about the thought of a scooter and has not wanted to get one   Dr. Durward Fortes does not wish to give her pain medication at this time She is still on cymbalta 120 per day She does feel like this helps her some, but she still continues to feel   She is on LT disability at this time, she is not working. She is worried that she wlll lose her job   Admits that she is feeling really depressed and anxious- she suffers from both at this time She is sleeping just a few hours at a time She is not able to get up and do much right  now No SI- she just wants to get better   NCCSR: no entries since 2018 Patient Active Problem List   Diagnosis Date Noted  . Unilateral primary osteoarthritis, right hip 03/05/2016  . Osteoarthritis of left hip 08/03/2015  . 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 Latex allergy 08/01/2015  . Weight gain due to medication 10/19/2014  . Depression with anxiety 08/06/2013  . Uses oral contraception 07/01/2012  . Lupus ([place]) 08/09/2011  . Joint pain 07/23/2011    Past Medical History:  Diagnosis Date  . Arthritis   . Depression    takes Cymbalta daily  . Joint pain   . Joint swelling   . Lupus ([place])   . Polyarthralgia    takes Humira and Methotrexate  . Seizures ([place])    hx of-as a child. No meds    Past Surgical History:  Procedure Laterality Date  . TOTAL HIP ARTHROPLASTY Left 08/03/2015   Procedure: LEFT TOTAL HIP ARTHROPLASTY;  Surgeon: Garald Balding, MD;  Location: [place];  Service: Orthopedics;  Laterality: Left;  . TOTAL HIP ARTHROPLASTY Right 03/05/2016   Procedure: TOTAL HIP ARTHROPLASTY;  Surgeon: Garald Balding, MD;  Location: [facility];  Service: Orthopedics;  Laterality: Right;    Social History   Tobacco Use  . Smoking status: Never Smoker  . Smokeless tobacco: Never  Used  Substance Use Topics  . Alcohol use: No  . Drug use: No    No family history on file.  Allergies  Allergen Reactions  . Latex Hives    Medication list has been reviewed and updated.  Current Outpatient Medications on File Prior to Visit  Medication Sig Dispense Refill  . cephALEXin (KEFLEX) 500 MG capsule Take 1 capsule (500 mg total) 4 (four) times daily by mouth. Take 4 tablets prior to any dental procedure. 16 capsule 0  . DULoxetine (CYMBALTA) 60 MG capsule TAKE 1 CAPSULE BY MOUTH TWO TIMES DAILY 60 capsule 1  . DULoxetine (CYMBALTA) 60 MG capsule Take 1 capsule (60 mg total) by mouth 2 (two) times daily. 180 capsule 3  . HUMIRA PEN 40 MG/0.8ML PNKT once a week.     Lenard Forth Triphasic (ORTHO TRI-CYCLEN LO PO) Take 1 tablet by mouth daily.     . predniSONE (DELTASONE) 10 MG tablet Take 1 tablet (10 mg total) by mouth daily with breakfast. (Patient taking differently: Take 5 mg by mouth every other day. ) 30 tablet 1  . predniSONE (DELTASONE) 10 MG tablet Take 1 tablet (10 mg total) by mouth daily with breakfast. 90 tablet 3  . sulfaSALAzine (AZULFIDINE) 500 MG tablet Take 1,000 mg by mouth 2 (two) times daily.     [name], 28, 0.18/0.215/0.25 MG-35 MCG tablet Take 1 tablet by mouth daily.  0  . rivaroxaban (XARELTO) 10 MG TABS tablet Take 1 tablet (10 mg total) by mouth daily with breakfast. (Patient not taking: Reported on 02/06/2017) 30 tablet 0   No current facility-administered medications on file prior to visit.     Review of Systems:  As per HPI- otherwise negative.   Physical Examination: Vitals:   06/23/17 1125  BP: (!) 136/94  Pulse: 73  Resp: 16   Vitals:   06/23/17 1125  Weight: 145 lb 9.6 oz (66 kg)  Height: 5\' 1"  (1.549 m)   Body mass index is 27.51 kg/m. Ideal Body Weight: Weight in (lb) to have BMI = 25: 132  GEN: WDWN, NAD, Non-toxic, A & O x 3, looks well physically but has abnormal gait HEENT: Atraumatic, Normocephalic. Neck supple. No masses, No LAD. Ears and Nose: No external deformity. CV: RRR, No M/G/R. No JVD. No thrill. No extra heart sounds. PULM: CTA B, no wheezes, crackles, rhonchi. No retractions. No resp. distress. No accessory muscle use. ABD: S, NT, ND EXTR: No c/c/e NEURO antalgic gait, uses a cane PSYCH: Normally interactive. Conversant. Not depressed or anxious appearing.  Calm demeanor.  Weak grip in both hands   Assessment and Plan: Adjustment reaction with anxiety and depression - Plan: sertraline (ZOLOFT) 50 MG tablet  Systemic lupus erythematosus, unspecified SLE type, unspecified organ involvement status ([place]) - Plan: Ambulatory referral to Physical Therapy  Other chronic pain -  Plan: HYDROcodone-acetaminophen (NORCO/VICODIN) 5-325 MG tablet, CANCELED: Pain Mgmt, Profile 8 w/Conf, U  Poor balance - Plan: Ambulatory referral to Physical Therapy  Generalized weakness - Plan: Ambulatory referral to Physical Therapy  Here today to discuss some chronic issues- she has lupus and is s/p bilateral hip replacements However she continues to have hip pain, slow gait, and now more pains in other joints such as her hands She feels off balance and is afraid that she may fall.   She does not get out of the house much, and is becoming depressed although she assures me that she is at no risk of  self harm Also, she is kept up at night by the pain in her RIGHT hip as well as her anxiety.  Plan to start her on a pain control regimen rx for hydrocodone, she will see me in one month Also plan to come off cymbalta and try sertraline instead. See pt instructions for taper guidelines Also referral to PT   Will do contract and UDS at next visit- did not do contract today as I am not sure what dose of med we will end up on   [place], MD  Did NOT get UDS today as she was not able to give a sample

## 2017-06-23 ENCOUNTER — Ambulatory Visit (INDEPENDENT_AMBULATORY_CARE_PROVIDER_SITE_OTHER): Payer: 59 | Admitting: Family Medicine

## 2017-06-23 ENCOUNTER — Encounter: Payer: Self-pay | Admitting: Family Medicine

## 2017-06-23 VITALS — BP 136/94 | HR 73 | Resp 16 | Ht 61.0 in | Wt 145.6 lb

## 2017-06-23 DIAGNOSIS — M329 Systemic lupus erythematosus, unspecified: Secondary | ICD-10-CM

## 2017-06-23 DIAGNOSIS — G8929 Other chronic pain: Secondary | ICD-10-CM

## 2017-06-23 DIAGNOSIS — F4323 Adjustment disorder with mixed anxiety and depressed mood: Secondary | ICD-10-CM | POA: Diagnosis not present

## 2017-06-23 DIAGNOSIS — R2689 Other abnormalities of gait and mobility: Secondary | ICD-10-CM

## 2017-06-23 DIAGNOSIS — R531 Weakness: Secondary | ICD-10-CM

## 2017-06-23 MED ORDER — HYDROCODONE-ACETAMINOPHEN 5-325 MG PO TABS: ORAL_TABLET | ORAL | 0 refills | Status: DC

## 2017-06-23 MED ORDER — SERTRALINE HCL 50 MG PO TABS: 50.0000 mg | ORAL_TABLET | Freq: Every day | ORAL | 5 refills | Status: DC

## 2017-06-23 NOTE — Patient Instructions (Addendum)
Lets taper off cymbalta and go onto sertraline (zoloft). Take one cymbalta a day for 5 days, then every other day for about 5 doses- then stop and go on sertraline 50 mg, after one week can increase to 100 mg of sertraline  We will have you use hydrocodone 5 mg at bedtime as needed for pain  I will set you up with physical therapy to work on your strength and balance Please think about seeing a counselor to discuss some of what you have been going through   Please let me know if you are not doing ok, and otherwise let's visit in one month

## 2017-06-27 ENCOUNTER — Telehealth: Payer: Self-pay | Admitting: *Deleted

## 2017-06-27 ENCOUNTER — Encounter: Payer: Self-pay | Admitting: Family Medicine

## 2017-06-27 NOTE — Telephone Encounter (Signed)
Received request for Medical records from [place], forwarded to [place] for email/scan/SLS 06/07

## 2017-07-01 ENCOUNTER — Telehealth: Payer: Self-pay

## 2017-07-01 NOTE — Telephone Encounter (Signed)
PA initiated via Covermymeds; KEY: [id number]. Received real time PA approval.   Request Reference Number: [id number]. HYDROCO/APAP TAB 5-325MG  is approved through 12/31/2017. For further questions, call [phone]

## 2017-07-24 DIAGNOSIS — M459 Ankylosing spondylitis of unspecified sites in spine: Secondary | ICD-10-CM | POA: Insufficient documentation

## 2017-07-24 NOTE — Progress Notes (Deleted)
[place] at [facility] [address] [place], [place] [address] [phone]  Date:  07/28/2017   Name:  Rose Campbell   DOB:  29-Nov-1985   MRN:  [id number]  PCP:  Darreld Mclean, MD    Chief Complaint: No chief complaint on file.   History of Present Illness:  Rose Campbell is a 32 y.o. very pleasant female patient who presents with the following:  Short term follow-up today [name] is a very nice young woman who unfortunately has had a lot of health problems for her age.  She suffers from lupus, ankylosing spondylitis and has undergone bilateral hip replacements due to her disease    From our last visit about one month ago: Patient who I know well but had not seen since 1/18 History of lupus s/p bilateral hip replacements  Pt notes that "I thought life and stuff would be better since surgery." Her 2nd hip is still painful, and she is not sleeping well, she still has a very hard time getting around Her insomnia seems to be due to pain and also restlessness, anxiety She notes that some other joints are also painful such as her hands She has hard time driving, opening things, writing Her hands bothering her is more recent  Her lupus doc is with [facility] Dr. Earnest Conroy- per her last note 3/19: Rose Campbell is here for f/u spondyloarthritis,ankylosing spondylitis, therapy monitoring  They increased her dosages of Humira, sulfasalazine in hopes of improving her sx  She is on pred as needed - 5 mg qod generally  She has lost some weight, but is frustrated as her function is not better She is using her cane to walk  She is embarrassed about the thought of a scooter and has not wanted to get one   Dr. Durward Fortes does not wish to give her pain medication at this time She is still on cymbalta 120 per day She does feel like this helps her some, but she still continues to feel  She is on LT disability at this time, she  is not working. She is worried that she wlll lose her job  Admits that she is feeling really depressed and anxious- she suffers from both at this time She is sleeping just a few hours at a time She is not able to get up and do much right now No SI- she just wants to get better /////////////////////////////////////////////////////////////////////////////////////////////// Here today to discuss some chronic issues- she has lupus and is s/p bilateral hip replacements However she continues to have hip pain, slow gait, and now more pains in other joints such as her hands She feels off balance and is afraid that she may fall.   She does not get out of the house much, and is becoming depressed although she assures me that she is at no risk of self harm Also, she is kept up at night by the pain in her RIGHT hip as well as her anxiety.  Plan to start her on a pain control regimen rx for hydrocodone, she will see me in one month Also plan to come off cymbalta and try sertraline instead. See pt instructions for taper guidelines Also referral to PT  Will do contract and UDS at next visit- did not do contract today as I am not sure what dose of med we will end up on   [place] review:  Since our last visit, pt notes that  Patient  Active Problem List   Diagnosis Date Noted  . Unilateral primary osteoarthritis, right hip 03/05/2016  . Osteoarthritis of left hip 08/03/2015  . 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 Latex allergy 08/01/2015  . Weight gain due to medication 10/19/2014  . Depression with anxiety 08/06/2013  . Uses oral contraception 07/01/2012  . Lupus ([place]) 08/09/2011  . Joint pain 07/23/2011    Past Medical History:  Diagnosis Date  . Arthritis   . Depression    takes Cymbalta daily  . Joint pain   . Joint swelling   . Lupus ([place])   . Polyarthralgia    takes Humira and Methotrexate  . Seizures ([place])    hx of-as a child. No meds    Past Surgical History:  Procedure  Laterality Date  . TOTAL HIP ARTHROPLASTY Left 08/03/2015   Procedure: LEFT TOTAL HIP ARTHROPLASTY;  Surgeon: Garald Balding, MD;  Location: [facility];  Service: Orthopedics;  Laterality: Left;  . TOTAL HIP ARTHROPLASTY Right 03/05/2016   Procedure: TOTAL HIP ARTHROPLASTY;  Surgeon: Garald Balding, MD;  Location: [facility];  Service: Orthopedics;  Laterality: Right;    Social History   Tobacco Use  . Smoking status: Never Smoker  . Smokeless tobacco: Never Used  Substance Use Topics  . Alcohol use: No  . Drug use: No    No family history on file.  Allergies  Allergen Reactions  . Latex Hives    Medication list has been reviewed and updated.  Current Outpatient Medications on File Prior to Visit  Medication Sig Dispense Refill  . cephALEXin (KEFLEX) 500 MG capsule Take 1 capsule (500 mg total) 4 (four) times daily by mouth. Take 4 tablets prior to any dental procedure. 16 capsule 0  . DULoxetine (CYMBALTA) 60 MG capsule TAKE 1 CAPSULE BY MOUTH TWO TIMES DAILY 60 capsule 1  . DULoxetine (CYMBALTA) 60 MG capsule Take 1 capsule (60 mg total) by mouth 2 (two) times daily. 180 capsule 3  . HUMIRA PEN 40 MG/0.8ML PNKT once a week.     Marland Kitchen HYDROcodone-acetaminophen (NORCO/VICODIN) 5-325 MG tablet Take 1/2 or 1 at bedtime as needed for pain 30 tablet 0  . Norgestim-Eth Estrad Triphasic (ORTHO TRI-CYCLEN LO PO) Take 1 tablet by mouth daily.     . predniSONE (DELTASONE) 10 MG tablet Take 1 tablet (10 mg total) by mouth daily with breakfast. (Patient taking differently: Take 5 mg by mouth every other day. ) 30 tablet 1  . predniSONE (DELTASONE) 10 MG tablet Take 1 tablet (10 mg total) by mouth daily with breakfast. 90 tablet 3  . sertraline (ZOLOFT) 50 MG tablet Take 1 tablet (50 mg total) by mouth daily. Increase to 100 mg after one week 60 tablet 5  . sulfaSALAzine (AZULFIDINE) 500 MG tablet Take 1,000 mg by mouth 2 (two) times daily.     Rose Campbell, 29, 0.18/0.215/0.25 MG-35 MCG tablet Take 1  tablet by mouth daily.  0   No current facility-administered medications on file prior to visit.     Review of Systems:  ***  Physical Examination: There were no vitals filed for this visit. There were no vitals filed for this visit. There is no height or weight on file to calculate BMI. Ideal Body Weight:    ***  Assessment and Plan: ***  Signed Lamar Blinks, MD

## 2017-07-28 ENCOUNTER — Ambulatory Visit: Payer: 59 | Admitting: Family Medicine

## 2017-07-28 DIAGNOSIS — Z0289 Encounter for other administrative examinations: Secondary | ICD-10-CM

## 2017-08-01 ENCOUNTER — Ambulatory Visit (INDEPENDENT_AMBULATORY_CARE_PROVIDER_SITE_OTHER): Payer: 59 | Admitting: Orthopaedic Surgery

## 2017-08-01 ENCOUNTER — Encounter (INDEPENDENT_AMBULATORY_CARE_PROVIDER_SITE_OTHER): Payer: Self-pay | Admitting: Orthopaedic Surgery

## 2017-08-01 ENCOUNTER — Ambulatory Visit (INDEPENDENT_AMBULATORY_CARE_PROVIDER_SITE_OTHER): Payer: Self-pay

## 2017-08-01 VITALS — BP 122/99 | HR 100 | Ht 61.0 in | Wt 139.0 lb

## 2017-08-01 DIAGNOSIS — M25551 Pain in right hip: Secondary | ICD-10-CM | POA: Diagnosis not present

## 2017-08-01 DIAGNOSIS — Z96641 Presence of right artificial hip joint: Secondary | ICD-10-CM | POA: Diagnosis not present

## 2017-08-01 NOTE — Progress Notes (Signed)
Office Visit Note   Patient: Rose Campbell           Date of Birth: 11-Jan-1986           MRN: [id number] Visit Date: 08/01/2017              Requested by: Darreld Mclean, MD [address] [place], [place] [address] PCP: Darreld Mclean, MD   Assessment & Plan: Visit Diagnoses:  1. Pain in right hip   2. 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  Has a history of mixed connective tissue disease i.e. lupus.  Presently being treated.  Uses a cane for ambulation.  Mild pain and some limitation of motion right hip I am sure is related to the myositis ossificans.  Long discussion and review of her x-rays with Ms. Bordeaux.  I would suggest monitoring this.  At some point we could always consider excising the myositis after obtaining bone scan to monitor its activity.  For the moment she seems to be happy without increased pain  Follow-Up Instructions: Return in about 6 months (around 02/01/2018).   Orders:  Orders Placed This Encounter  Procedures  . XR HIP UNILAT W OR W/O PELVIS 2-3 VIEWS RIGHT   No orders of the defined types were placed in this encounter.     Procedures: No procedures performed   Clinical Data: No additional findings.   Subjective: Chief Complaint  Patient presents with  . Follow-up    6  MO F/U CALCIUM BUILD UP ON R HIP, STILL HAVING PAIN AND LIMPS TO WALK, NOT MUCH MOTION. THINKS R LEG IS LONGER THAN L. STILL HAVING BACK PAIN  Still experiencing some limited range of motion of her right hip with some discomfort.  No numbness or tingling.  Does have history of chronic back pain.  Had successful left total hip replacement several years ago and is doing very well on that side  HPI  Review of Systems  Constitutional: Negative for fatigue and fever.  HENT: Positive for ear pain.     Eyes: Negative for pain.  Respiratory: Negative for cough and shortness of breath.   Cardiovascular: Positive for leg swelling.  Gastrointestinal: Negative for constipation and diarrhea.  Genitourinary: Negative for difficulty urinating.  Musculoskeletal: Positive for back pain and neck pain.  Skin: Negative for rash.  Neurological: Positive for weakness. Negative for numbness.  Hematological: Bruises/bleeds easily.  Psychiatric/Behavioral: Positive for sleep disturbance.     Objective: Vital Signs: BP (!) 122/99 (BP Location: Left Arm, Patient Position: Sitting, Cuff Size: Normal)   Pulse 100   Ht 5\' 1"  (1.549 m)   Wt 139 lb (63 kg)   BMI 26.26 kg/m   Physical Exam  Ortho Exam awake alert and oriented x3.  Comfortable sitting.  Painless range of motion left hip with internal/external rotation.  Leg lengths appear to be symmetrical.  Mild pain with internal/external rotation of her right hip and some limited external/internal rotation compared to the left side.  No local tenderness about the thigh or pain about the lateral aspect of her right hip.  Straight leg raise negative.  Does have some arthritic changes in her right knee with some mild discomfort.  No distal edema.  Skin intact  Specialty Comments:  No specialty comments available.  Imaging: Xr Hip  Unilat W Or W/o Pelvis 2-3 Views Right  Result Date: 08/01/2017 AP the pelvis and lateral of the right hip were obtained and compared to the films were performed approximately a year ago.  There is significant myositis ossificans termination about her right hip more than on the left.  This limits her motion.  It appears to be more mature now than it was a year ago.  Total hip components on both the right and left appear to be in good position without complication other than the myositis    PMFS History: Patient Active Problem List   Diagnosis Date Noted  . Ankylosing spondylitis ([place]) 07/24/2017  . Unilateral primary  osteoarthritis, right hip 03/05/2016  . Osteoarthritis of left hip 08/03/2015  . 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 Latex allergy 08/01/2015  . Weight gain due to medication 10/19/2014  . Depression with anxiety 08/06/2013  . Uses oral contraception 07/01/2012  . Lupus ([place]) 08/09/2011  . Joint pain 07/23/2011   Past Medical History:  Diagnosis Date  . Arthritis   . Depression    takes Cymbalta daily  . Joint pain   . Joint swelling   . Lupus ([place])   . Polyarthralgia    takes Humira and Methotrexate  . Seizures ([place])    hx of-as a child. No meds    History reviewed. No pertinent family history.  Past Surgical History:  Procedure Laterality Date  . TOTAL HIP ARTHROPLASTY Left 08/03/2015   Procedure: LEFT TOTAL HIP ARTHROPLASTY;  Surgeon: Garald Balding, MD;  Location: [facility];  Service: Orthopedics;  Laterality: Left;  . TOTAL HIP ARTHROPLASTY Right 03/05/2016   Procedure: TOTAL HIP ARTHROPLASTY;  Surgeon: Garald Balding, MD;  Location: [facility];  Service: Orthopedics;  Laterality: Right;   Social History   Occupational History  . Not on file  Tobacco Use  . Smoking status: Never Smoker  . Smokeless tobacco: Never Used  Substance and Sexual Activity  . Alcohol use: No  . Drug use: No  . Sexual activity: Yes    Birth control/protection: Pill

## 2017-08-02 NOTE — Progress Notes (Deleted)
[place] at [facility] [address], [place], [place] [address] [phone]  Date:  08/06/2017   Name:  Rose Campbell   DOB:  03-17-1985   MRN:  [id number]  PCP:  Darreld Mclean, MD    Chief Complaint: No chief complaint on file.   History of Present Illness:  Rose Campbell is a 32 y.o. very pleasant female patient who presents with the following:  Short term follow-up visit today Last seen here in early June for her chronic hip pain and depression:  Here today to discuss some chronic issues- she has lupus and is s/p bilateral hip replacements However she continues to have hip pain, slow gait, and now more pains in other joints such as her hands She feels off balance and is afraid that she may fall.   She does not get out of the house much, and is becoming depressed although she assures me that she is at no risk of self harm Also, she is kept up at night by the pain in her RIGHT hip as well as her anxiety.  Plan to start her on a pain control regimen rx for hydrocodone, she will see me in one month Also plan to come off cymbalta and try sertraline instead. See pt instructions for taper guidelines Also referral to PT   She also did see ortho last week:  Assessment & Plan: Visit Diagnoses:  1. Pain in right hip   2. 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  Has a history of mixed connective tissue disease i.e. lupus.  Presently being treated.  Uses a cane for ambulation.  Mild pain and some limitation of motion right hip I am sure is related to the myositis ossificans.  Long discussion and review of her x-rays with Rose Campbell.  I would suggest monitoring this.  At some point we could always consider excising the myositis after obtaining bone scan to monitor its  activity.  For the moment she seems to be happy without increased pain  Follow-Up Instructions: Return in about 6 months (around 02/01/2018   Needs contract and UDS today           Patient Active Problem List   Diagnosis Date Noted  . Ankylosing spondylitis ([place]) 07/24/2017  . Unilateral primary osteoarthritis, right hip 03/05/2016  . Osteoarthritis of left hip 08/03/2015  . 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 Latex allergy 08/01/2015  . Weight gain due to medication 10/19/2014  . Depression with anxiety 08/06/2013  . Uses oral contraception 07/01/2012  . Lupus ([place]) 08/09/2011  . Joint pain 07/23/2011    Past Medical History:  Diagnosis Date  . Arthritis   . Depression    takes Cymbalta daily  . Joint pain   . Joint swelling   . Lupus ([place])   . Polyarthralgia    takes Humira and Methotrexate  . Seizures ([place])    hx of-as a child. No meds    Past Surgical History:  Procedure Laterality Date  . TOTAL HIP ARTHROPLASTY Left 08/03/2015   Procedure: LEFT TOTAL HIP ARTHROPLASTY;  Surgeon: Garald Balding, MD;  Location: [facility];  Service: Orthopedics;  Laterality: Left;  . TOTAL HIP ARTHROPLASTY Right 03/05/2016   Procedure: TOTAL HIP ARTHROPLASTY;  Surgeon: Garald Balding, MD;  Location: [facility];  Service: Orthopedics;  Laterality: Right;    Social History   Tobacco Use  . Smoking status: Never Smoker  . Smokeless tobacco: Never Used  Substance Use Topics  . Alcohol use: No  . Drug use: No    No family history on file.  Allergies  Allergen Reactions  . Latex Hives    Medication list has been reviewed and updated.  Current Outpatient Medications on File Prior to Visit  Medication Sig Dispense Refill  . cephALEXin (KEFLEX) 500 MG capsule Take 1 capsule (500 mg total) 4 (four) times daily by mouth. Take 4 tablets prior to any dental procedure. (Patient not taking: Reported on 08/01/2017) 16 capsule 0  . DULoxetine (CYMBALTA) 60 MG capsule TAKE 1  CAPSULE BY MOUTH TWO TIMES DAILY (Patient not taking: Reported on 08/01/2017) 60 capsule 1  . DULoxetine (CYMBALTA) 60 MG capsule Take 1 capsule (60 mg total) by mouth 2 (two) times daily. (Patient not taking: Reported on 08/01/2017) 180 capsule 3  . HUMIRA PEN 40 MG/0.8ML PNKT once a week.     Marland Kitchen HYDROcodone-acetaminophen (NORCO/VICODIN) 5-325 MG tablet Take 1/2 or 1 at bedtime as needed for pain 30 tablet 0  . Norgestim-Eth Estrad Triphasic (ORTHO TRI-CYCLEN LO PO) Take 1 tablet by mouth daily.     . predniSONE (DELTASONE) 10 MG tablet Take 1 tablet (10 mg total) by mouth daily with breakfast. (Patient taking differently: Take 5 mg by mouth every other day. ) 30 tablet 1  . predniSONE (DELTASONE) 10 MG tablet Take 1 tablet (10 mg total) by mouth daily with breakfast. (Patient not taking: Reported on 08/01/2017) 90 tablet 3  . sertraline (ZOLOFT) 50 MG tablet Take 1 tablet (50 mg total) by mouth daily. Increase to 100 mg after one week 60 tablet 5  . sulfaSALAzine (AZULFIDINE) 500 MG tablet Take 1,000 mg by mouth 2 (two) times daily.     [name], 29, 0.18/0.215/0.25 MG-35 MCG tablet Take 1 tablet by mouth daily.  0   No current facility-administered medications on file prior to visit.     Review of Systems:  As per HPI- otherwise negative.   Physical Examination: There were no vitals filed for this visit. There were no vitals filed for this visit. There is no height or weight on file to calculate BMI. Ideal Body Weight:    GEN: WDWN, NAD, Non-toxic, A & O x 3 HEENT: Atraumatic, Normocephalic. Neck supple. No masses, No LAD. Ears and Nose: No external deformity. CV: RRR, No M/G/R. No JVD. No thrill. No extra heart sounds. PULM: CTA B, no wheezes, crackles, rhonchi. No retractions. No resp. distress. No accessory muscle use. ABD: S, NT, ND, +BS. No rebound. No HSM. EXTR: No c/c/e NEURO Normal gait.  PSYCH: Normally interactive. Conversant. Not depressed or anxious appearing.  Calm  demeanor.    Assessment and Plan: ***  Signed Lamar Blinks, MD

## 2017-08-06 ENCOUNTER — Ambulatory Visit: Payer: 59 | Admitting: Family Medicine

## 2017-08-06 ENCOUNTER — Telehealth: Payer: Self-pay | Admitting: *Deleted

## 2017-08-06 ENCOUNTER — Encounter: Payer: Self-pay | Admitting: Family Medicine

## 2017-08-06 DIAGNOSIS — Z0289 Encounter for other administrative examinations: Secondary | ICD-10-CM

## 2017-08-06 NOTE — Telephone Encounter (Signed)
Received request for Medical records from [place] [employer], forwarded to [place] for email/scan/SLS 07/17   

## 2017-08-09 NOTE — Progress Notes (Signed)
[place] at [facility] [address], [place],  [address] [phone]  Date:  08/11/2017   Name:  Rose Campbell   DOB:  08-Dec-1985   MRN:  [id number]  PCP:  Darreld Mclean, MD    Chief Complaint: Anxiety (1 month follow up) and Depression   History of Present Illness:  Rose Campbell is a 32 y.o. very pleasant female patient who presents with the following:  Following up today- history of lupus and related significant joint problems at an early age. S/p bilateral hip replacements. From our last visit together in early June:  Patient who I know well but had not seen since 1/18 History of lupus s/p bilateral hip replacements  Pt notes that "I thought life and stuff would be better since surgery." Her 2nd hip is still painful, and she is not sleeping well, she still has a very hard time getting around Her insomnia seems to be due to pain and also restlessness, anxiety She notes that some other joints are also painful such as her hands She has hard time driving, opening things, writing Her hands bothering her is more recent  Her lupus doc is with [facility] Dr. Earnest Conroy- per her last note 3/19: Rose Campbell is here for f/u spondyloarthritis,ankylosing spondylitis, therapy monitoring  They increased her dosages of Humira, sulfasalazine in hopes of improving her sx  She is on pred as needed - 5 mg qod generally  She has lost some weight, but is frustrated as her function is not better She is using her cane to walk  She is embarrassed about the thought of a scooter and has not wanted to get one   Dr. Durward Fortes does not wish to give her pain medication at this time She is still on cymbalta 120 per day She does feel like this helps her some, but she still continues to feel   She is on LT disability at this time, she is not working. She is worried that she wlll lose her job   Admits that she is feeling  really depressed and anxious- she suffers from both at this time She is sleeping just a few hours at a time She is not able to get up and do much right now No SI- she just wants to get better //////////////////////////////////////////////////////////////////////////////////////////// Here today to discuss some chronic issues- she has lupus and is s/p bilateral hip replacements However she continues to have hip pain, slow gait, and now more pains in other joints such as her hands She feels off balance and is afraid that she may fall.   She does not get out of the house much, and is becoming depressed although she assures me that she is at no risk of self harm Also, she is kept up at night by the pain in her RIGHT hip as well as her anxiety.  Plan to start her on a pain control regimen rx for hydrocodone, she will see me in one month Also plan to come off cymbalta and try sertraline instead. See pt instructions for taper guidelines Also referral to PT   Needs contract and UDS today She reports that she saw her rheum last week- they are stopping humira and changing to a new med that I am not familiar with  We hope that this will help with her pain and joint swelling She does feel like her energy is a bit better since we changed to sertraline  She is off cymbalta and is on sertraline. She does not feel ike her mood is all that much better however she is not worse and has no SI  She is taking 2 pills of sertraline now or 100 mg  They hydrocodone at night is helpful somewhat and she would like to continue to use this - 5mg  at bedtime   Reviewed NCCSR: ok, no unexpected entries today  Did UDS and contract today    Patient Active Problem List   Diagnosis Date Noted  . Ankylosing spondylitis ([place]) 07/24/2017  . Unilateral primary osteoarthritis, right hip 03/05/2016  . Osteoarthritis of left hip 08/03/2015  . 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 Latex allergy 08/01/2015  . Weight gain  due to medication 10/19/2014  . Depression with anxiety 08/06/2013  . Uses oral contraception 07/01/2012  . Lupus ([place]) 08/09/2011  . Joint pain 07/23/2011    Past Medical History:  Diagnosis Date  . Arthritis   . Depression    takes Cymbalta daily  . Joint pain   . Joint swelling   . Lupus ([place])   . Polyarthralgia    takes Humira and Methotrexate  . Seizures ([place])    hx of-as a child. No meds    Past Surgical History:  Procedure Laterality Date  . TOTAL HIP ARTHROPLASTY Left 08/03/2015   Procedure: LEFT TOTAL HIP ARTHROPLASTY;  Surgeon: Garald Balding, MD;  Location: [facility];  Service: Orthopedics;  Laterality: Left;  . TOTAL HIP ARTHROPLASTY Right 03/05/2016   Procedure: TOTAL HIP ARTHROPLASTY;  Surgeon: Garald Balding, MD;  Location: [facility];  Service: Orthopedics;  Laterality: Right;    Social History   Tobacco Use  . Smoking status: Never Smoker  . Smokeless tobacco: Never Used  Substance Use Topics  . Alcohol use: No  . Drug use: No    No family history on file.  Allergies  Allergen Reactions  . Latex Hives    Medication list has been reviewed and updated.  Current Outpatient Medications on File Prior to Visit  Medication Sig Dispense Refill  . HUMIRA PEN 40 MG/0.8ML PNKT once a week.     Lenard Forth Triphasic (ORTHO TRI-CYCLEN LO PO) Take 1 tablet by mouth daily.     . predniSONE (DELTASONE) 10 MG tablet Take 1 tablet (10 mg total) by mouth daily with breakfast. (Patient taking differently: Take 5 mg by mouth every other day. ) 30 tablet 1  . sulfaSALAzine (AZULFIDINE) 500 MG tablet Take 1,000 mg by mouth 2 (two) times daily.      No current facility-administered medications on file prior to visit.     Review of Systems:  As per HPI- otherwise negative. No fever or chills    Physical Examination: Vitals:   08/11/17 1120  BP: (!) 130/92  Pulse: 86  Resp: 16  SpO2: 98%   Vitals:   08/11/17 1120  Weight: 148 lb (67.1 kg)   Height: 5\' 1"  (1.549 m)   Body mass index is 27.96 kg/m. Ideal Body Weight: Weight in (lb) to have BMI = 25: 132  GEN: WDWN, NAD, Non-toxic, A & O x 3, normal weight, looks well  HEENT: Atraumatic, Normocephalic. Neck supple. No masses, No LAD.  Bilateral TM wnl, oropharynx normal.  PEERL,EOMI.   Ears and Nose: No external deformity.   CV: RRR, No M/G/R. No JVD. No thrill. No extra heart sounds. PULM: CTA B, no wheezes, crackles, rhonchi. No retractions. No resp. distress. No accessory  muscle use. ABD: S, NT, ND EXTR: No c/c/e NEURO Normal gait for pt- wide based and slow, using a cane  PSYCH: Normally interactive. Conversant. Not depressed or anxious appearing.  Calm demeanor.    Assessment and Plan: Adjustment reaction with anxiety and depression - Plan: sertraline (ZOLOFT) 100 MG tablet  Other chronic pain - Plan: HYDROcodone-acetaminophen (NORCO/VICODIN) 5-325 MG tablet, Pain Mgmt, Profile 8 w/Conf, U  Following up today She has suffered a lot from physical pain in her life. We have started her on vicodin 5 at bedtime and she can continue to use this as needed Will try increasing her sertraline to 150 and then 200 mg if needed - she will let me know how this works for her  Follow-up in 3 months   Signed Lamar Blinks, MD

## 2017-08-11 ENCOUNTER — Ambulatory Visit (INDEPENDENT_AMBULATORY_CARE_PROVIDER_SITE_OTHER): Payer: 59 | Admitting: Family Medicine

## 2017-08-11 ENCOUNTER — Encounter: Payer: Self-pay | Admitting: Family Medicine

## 2017-08-11 VITALS — BP 130/92 | HR 86 | Resp 16 | Ht 61.0 in | Wt 148.0 lb

## 2017-08-11 DIAGNOSIS — F4323 Adjustment disorder with mixed anxiety and depressed mood: Secondary | ICD-10-CM

## 2017-08-11 DIAGNOSIS — G8929 Other chronic pain: Secondary | ICD-10-CM | POA: Diagnosis not present

## 2017-08-11 MED ORDER — HYDROCODONE-ACETAMINOPHEN 5-325 MG PO TABS: ORAL_TABLET | ORAL | 0 refills | Status: DC

## 2017-08-11 MED ORDER — SERTRALINE HCL 100 MG PO TABS: 200.0000 mg | ORAL_TABLET | Freq: Every day | ORAL | 3 refills | Status: DC

## 2017-08-11 NOTE — Patient Instructions (Signed)
Good to see you today!  I refilled your hydrocodone and sent to [facility] Please go to the lab for your UDS today Let's try going up further on your sertaline. Start by going up to 150 mg for 2 weeks, then can go up to 200 mg if you like. Take care and please see me in 3 months or sooner if needed!

## 2017-08-13 ENCOUNTER — Encounter: Payer: Self-pay | Admitting: Family Medicine

## 2017-08-13 ENCOUNTER — Other Ambulatory Visit: Payer: Self-pay

## 2017-08-13 DIAGNOSIS — G8929 Other chronic pain: Secondary | ICD-10-CM

## 2017-08-13 NOTE — Telephone Encounter (Signed)
Received fax from [place] stating due to laws of opioid medications, [facility] will no longer dispense more than 30 day supply of patient's HYDROCODONE/APAP 5/325mg . Please send in new prescription for 30 day supply.

## 2017-08-14 LAB — PAIN MGMT, PROFILE 8 W/CONF, U
6 ACETYLMORPHINE: NEGATIVE ng/mL (ref <10)
ALPHAHYDROXYALPRAZOLAM: NEGATIVE ng/mL (ref <25)
AMINOCLONAZEPAM: NEGATIVE ng/mL (ref <25)
Alcohol Metabolites: NEGATIVE ng/mL (ref <500)
Alphahydroxymidazolam: NEGATIVE ng/mL (ref <50)
Alphahydroxytriazolam: NEGATIVE ng/mL (ref <50)
Amphetamines: NEGATIVE ng/mL (ref <500)
BENZODIAZEPINES: NEGATIVE ng/mL (ref <100)
BUPRENORPHINE, URINE: NEGATIVE ng/mL (ref <5)
Cocaine Metabolite: NEGATIVE ng/mL (ref <150)
Creatinine: 206.9 mg/dL
Hydroxyethylflurazepam: NEGATIVE ng/mL (ref <50)
LORAZEPAM: NEGATIVE ng/mL (ref <50)
MARIJUANA METABOLITE: NEGATIVE ng/mL (ref <20)
MARIJUANA METABOLITE: NEGATIVE ng/mL (ref <5)
MDMA: NEGATIVE ng/mL (ref <500)
Nordiazepam: NEGATIVE ng/mL (ref <50)
OPIATES: NEGATIVE ng/mL (ref <100)
Oxazepam: NEGATIVE ng/mL (ref <50)
Oxidant: NEGATIVE ug/mL (ref <200)
Oxycodone: NEGATIVE ng/mL (ref <100)
TEMAZEPAM: NEGATIVE ng/mL (ref <50)
pH: 6.26 (ref 4.5–9.0)

## 2017-08-14 MED ORDER — HYDROCODONE-ACETAMINOPHEN 5-325 MG PO TABS: ORAL_TABLET | ORAL | 0 refills | Status: DC

## 2017-08-14 NOTE — Telephone Encounter (Signed)
done

## 2017-08-20 ENCOUNTER — Telehealth (INDEPENDENT_AMBULATORY_CARE_PROVIDER_SITE_OTHER): Payer: Self-pay | Admitting: Orthopaedic Surgery

## 2017-08-20 NOTE — Telephone Encounter (Signed)
Records re faxed to [facility] retrievals [phone]/Rachel

## 2017-08-20 NOTE — Telephone Encounter (Signed)
I received VM from Apolonio Schneiders w/ [employer] records retrievals. IC, LMVM advising records were faxed 7/24. Advised if needs needs records re faxed or anything else to call and leave detailed msg as to her need. Stated if they have faxed a form that she would need to contact [place]

## 2017-08-25 ENCOUNTER — Telehealth: Payer: Self-pay | Admitting: *Deleted

## 2017-08-25 NOTE — Telephone Encounter (Signed)
Received request for Medical Records from [email or web address], Long Term Disability Ind c/o [employer] Retrievals; forwarded to [place] for email/scan/SLS 08/05

## 2017-09-19 ENCOUNTER — Encounter: Payer: Self-pay | Admitting: Family Medicine

## 2017-09-19 ENCOUNTER — Other Ambulatory Visit: Payer: Self-pay | Admitting: Family Medicine

## 2017-09-19 DIAGNOSIS — G8929 Other chronic pain: Secondary | ICD-10-CM

## 2017-09-19 MED ORDER — HYDROCODONE-ACETAMINOPHEN 5-325 MG PO TABS: ORAL_TABLET | ORAL | 0 refills | Status: DC

## 2017-09-19 MED ORDER — HYDROCODONE-ACETAMINOPHEN 5-325 MG PO TABS: ORAL_TABLET | ORAL | 0 refills | Status: AC

## 2017-09-19 NOTE — Telephone Encounter (Signed)
Could not log into NCCSR.  However per my notes it should be time for refill OV and UDS are UTD Refilled for now and for 30 days

## 2017-09-19 NOTE — Addendum Note (Signed)
Addended by: Lamar Blinks C on: 09/19/2017 04:21 PM   Modules accepted: Orders

## 2017-09-19 NOTE — Telephone Encounter (Signed)
e

## 2017-10-20 ENCOUNTER — Telehealth (INDEPENDENT_AMBULATORY_CARE_PROVIDER_SITE_OTHER): Payer: Self-pay | Admitting: Orthopaedic Surgery

## 2017-10-20 NOTE — Telephone Encounter (Signed)
PLEASE ADVISE.

## 2017-10-20 NOTE — Telephone Encounter (Signed)
LEFT MESSAGE FOR PT, WAITING FOR CALL BACK

## 2017-10-20 NOTE — Telephone Encounter (Signed)
Call pt and check on present status re work-if she plans on returning to work, if so, when. If rheumatologist is keeping her out of work

## 2017-10-20 NOTE — Telephone Encounter (Signed)
Rupali from [employer] called requesting a return call to clarify if the patient is able to return to work and to let her know if he feels the patient is still considered disabled in any capacity.  Please call back at [phone]

## 2017-10-22 NOTE — Telephone Encounter (Signed)
Left message with pt to confirm work status, waiting for call back. Peer to peer set up for 10/23/17 at 2:30

## 2017-10-23 NOTE — Telephone Encounter (Signed)
PER TO PEER COMPLETE

## 2017-10-23 NOTE — Telephone Encounter (Signed)
LEFT MESSAGE AGAIN TO CHECK ON WORK STATUS WAITING FOR PT TO CALL BACK

## 2017-10-24 ENCOUNTER — Telehealth: Payer: Self-pay

## 2017-10-24 NOTE — Telephone Encounter (Signed)
Copied from [place] [id number]. Topic: General - Other >> Oct 24, 2017 10:25 AM Sheran Luz wrote: Reason for CRM: Darren from Dr. Melburn Hake office requesting the after hours number for peer to peer with Dr. Lorelei Pont. Darren can be reached at [phone] to be given correct number.

## 2017-10-28 NOTE — Telephone Encounter (Signed)
I called last week and left my number for Dr. Melburn Hake to call me back.  Called again today but they said they do not need me any longer.  I think this is a disability review?

## 2017-10-29 ENCOUNTER — Telehealth: Payer: Self-pay

## 2017-10-29 ENCOUNTER — Encounter: Payer: Self-pay | Admitting: Family Medicine

## 2017-10-29 NOTE — Telephone Encounter (Signed)
Received rx request for hydrocodone-acetaminophen 5-325mg  via fax Contract: 2019 UDS: 08/11/17 Last OV: 08/11/17 Next Ov: 11/12/17 Last refill: 09/19/17, #30, 0RF, but rx filled 2X? Database: no discrepancies noted.   Please advise.

## 2017-10-29 NOTE — Telephone Encounter (Signed)
Reviewed NCCSR:   10/26/2017  1  09/19/2017  Hydrocodone-Acetamin 5-325 Mg  30.00 30 Je Cop  [id number]  [facility] ([id number])  0/0 5.00 MME Comm Ins  [place]  09/19/2017  1  09/19/2017  Hydrocodone-Acetamin 5-325 Mg  30.00 30 Je Cop  [id number]  Opt ([id number])  0/0 5.00 MME Comm Ins  [place]  08/20/2017  1  08/11/2017  Hydrocodone-Acetamin 5-325 Mg  30.00 30 Je Cop  [id number]  Opt ([id number])  0/0 5.00 MME Comm Ins  [place]  06/27/2017  2  06/23/2017  Hydrocodone-Acetamin 5-325 Mg  30.00 30 Je Cop  [id number]  [facility] ([id number])  0/0 5.00 MME Private Pay  [place]  03/25/2016  1  03/25/2016  Oxycodone Hcl 5 Mg Tablet  30.00 7 Pe Whi  [id number]  [facility] ([id number])  0/0 32.14 MME Comm Ins  [place]   It looks like this was just recently refilled Will sent message to pt to clarify

## 2017-10-31 ENCOUNTER — Telehealth (INDEPENDENT_AMBULATORY_CARE_PROVIDER_SITE_OTHER): Payer: Self-pay | Admitting: Orthopaedic Surgery

## 2017-10-31 ENCOUNTER — Telehealth: Payer: Self-pay | Admitting: *Deleted

## 2017-10-31 NOTE — Telephone Encounter (Signed)
Received correspondence regarding patient's disability claim from [place]; forwarded to provider/SLS 10/11

## 2017-10-31 NOTE — Telephone Encounter (Signed)
Patient called stating she was returning your call.   °

## 2017-10-31 NOTE — Telephone Encounter (Signed)
Notified pt peer to peer was done

## 2017-11-03 ENCOUNTER — Encounter: Payer: Self-pay | Admitting: Family Medicine

## 2017-11-03 NOTE — Telephone Encounter (Signed)
Today I received a packet from [place] about her disability claim. I provided a letter and will fax to provided number

## 2017-11-08 NOTE — Progress Notes (Deleted)
[place] at [facility] [address] [place], [place] [address] [phone]  Date:  11/12/2017   Name:  Avianna Moynahan   DOB:  1985-05-28   MRN:  [id number]  PCP:  Darreld Mclean, MD    Chief Complaint: No chief complaint on file.   History of Present Illness:  Gloris Shiroma is a 32 y.o. very pleasant female patient who presents with the following:  Periodic follow-up visit today Last seen here in July: She reports that she saw her rheum last week- they are stopping humira and changing to a new med that I am not familiar with  We hope that this will help with her pain and joint swelling She does feel like her energy is a bit better since we changed to sertraline  She is off cymbalta and is on sertraline. She does not feel ike her mood is all that much better however she is not worse and has no SI  She is taking 2 pills of sertraline now or 100 mg They hydrocodone at night is helpful somewhat and she would like to continue to use this - 5mg  at bedtime  Did UDS and contract today //////////////////////////////////// She has suffered a lot from physical pain in her life. We have started her on vicodin 5 at bedtime and she can continue to use this as needed Will try increasing her sertraline to 150 and then 200 mg if needed - she will let me know how this works for her   Pap: Flu: Patient Active Problem List   Diagnosis Date Noted  . Ankylosing spondylitis ([place]) 07/24/2017  . Unilateral primary osteoarthritis, right hip 03/05/2016  . Osteoarthritis of left hip 08/03/2015  . 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 Latex allergy 08/01/2015  . Weight gain due to medication 10/19/2014  . Depression with anxiety 08/06/2013  . Uses oral contraception 07/01/2012  . Lupus ([place]) 08/09/2011  . Joint pain 07/23/2011    Past Medical History:  Diagnosis Date  . Arthritis   . Depression    takes Cymbalta daily  . Joint pain    . Joint swelling   . Lupus ([place])   . Polyarthralgia    takes Humira and Methotrexate  . Seizures ([place])    hx of-as a child. No meds    Past Surgical History:  Procedure Laterality Date  . TOTAL HIP ARTHROPLASTY Left 08/03/2015   Procedure: LEFT TOTAL HIP ARTHROPLASTY;  Surgeon: Garald Balding, MD;  Location: [facility];  Service: Orthopedics;  Laterality: Left;  . TOTAL HIP ARTHROPLASTY Right 03/05/2016   Procedure: TOTAL HIP ARTHROPLASTY;  Surgeon: Garald Balding, MD;  Location: [facility];  Service: Orthopedics;  Laterality: Right;    Social History   Tobacco Use  . Smoking status: Never Smoker  . Smokeless tobacco: Never Used  Substance Use Topics  . Alcohol use: No  . Drug use: No    No family history on file.  Allergies  Allergen Reactions  . Latex Hives    Medication list has been reviewed and updated.  Current Outpatient Medications on File Prior to Visit  Medication Sig Dispense Refill  . HUMIRA PEN 40 MG/0.8ML PNKT once a week.     Marland Kitchen HYDROcodone-acetaminophen (NORCO/VICODIN) 5-325 MG tablet Take 1/2 or 1 at bedtime as needed for pain 30 tablet 0  . HYDROcodone-acetaminophen (NORCO/VICODIN) 5-325 MG tablet Take 1/2 or 1 at bedtime as needed for pain.  Please  fill in 30 days 30 tablet 0  . Norgestim-Eth Estrad Triphasic (ORTHO TRI-CYCLEN LO PO) Take 1 tablet by mouth daily.     . predniSONE (DELTASONE) 10 MG tablet Take 1 tablet (10 mg total) by mouth daily with breakfast. (Patient taking differently: Take 5 mg by mouth every other day. ) 30 tablet 1  . sertraline (ZOLOFT) 100 MG tablet Take 2 tablets (200 mg total) by mouth daily. 180 tablet 3  . sulfaSALAzine (AZULFIDINE) 500 MG tablet Take 1,000 mg by mouth 2 (two) times daily.      No current facility-administered medications on file prior to visit.     Review of Systems:  As per HPI- otherwise negative.   Physical Examination: There were no vitals filed for this visit. There were no vitals filed for this  visit. There is no height or weight on file to calculate BMI. Ideal Body Weight:    GEN: WDWN, NAD, Non-toxic, A & O x 3 HEENT: Atraumatic, Normocephalic. Neck supple. No masses, No LAD. Ears and Nose: No external deformity. CV: RRR, No M/G/R. No JVD. No thrill. No extra heart sounds. PULM: CTA B, no wheezes, crackles, rhonchi. No retractions. No resp. distress. No accessory muscle use. ABD: S, NT, ND, +BS. No rebound. No HSM. EXTR: No c/c/e NEURO Normal gait.  PSYCH: Normally interactive. Conversant. Not depressed or anxious appearing.  Calm demeanor.    Assessment and Plan: ***  Signed Lamar Blinks, MD

## 2017-11-12 ENCOUNTER — Ambulatory Visit: Payer: 59 | Admitting: Family Medicine

## 2017-11-13 ENCOUNTER — Ambulatory Visit: Payer: 59 | Admitting: Family Medicine

## 2017-11-13 DIAGNOSIS — Z0289 Encounter for other administrative examinations: Secondary | ICD-10-CM

## 2017-11-18 NOTE — Progress Notes (Deleted)
[place] at [facility] [address], [place], [place] [address] [phone]  Date:  11/19/2017   Name:  Finnleigh Marchetti   DOB:  [birth year]   MRN:  [id number]  PCP:  Darreld Mclean, MD    Chief Complaint: No chief complaint on file.   History of Present Illness:  Ilze Squyres is a 32 y.o. very pleasant female patient who presents with the following:  History of lupus/ ankylosing spondylitis with severe joint damage.  She has canceled several appts with me recently I last saw her in July: She has suffered a lot from physical pain in her life. We have started her on vicodin 5 at bedtime and she can continue to use this as needed Will try increasing her sertraline to 150 and then 200 mg if needed - she will let me know how this works for her   UDS July 2019 Her rheumatologist is at [facility], Dr. Esmond Plants Per her last note from July: D/C Humira  Start Cosentyx 150 mg once week x 5 weeks, then 150 mg SQ monthly. Continue sulfasalazine 500 mg 2 tabs in morning and 2 tabs at bedtime. Continue prednisone 10 mg only as needed.  Pap: Flu: NCCSR reviewed: 10/26/2017  1   09/19/2017  Hydrocodone-Acetamin 5-325 Mg  30.00 30 Je Cop  [id number]  Opt (4524)  0/0 5.00 MME Comm Ins  [place]  09/19/2017  1   09/19/2017  Hydrocodone-Acetamin 5-325 Mg  30.00 30 Je Cop  [id number]  Opt (4524)  0/0 5.00 MME Comm Ins  [place]  08/20/2017  1   08/11/2017  Hydrocodone-Acetamin 5-325 Mg  30.00 30 Je Cop  [id number]  Opt (4524)  0/0 5.00 MME Comm Ins  [place]  06/27/2017  2   06/23/2017  Hydrocodone-Acetamin 5-325 Mg  30.00 30 Je Cop  [id number]  Wal ([id number])  0/0 5.00 MME Private Pay  [place]  03/25/2016  1   03/25/2016  Oxycodone Hcl 5 Mg Tablet  30.00 7 Pe Whi  [id number]  Wal ([id number])  0/0 32.14 MME Comm Ins  [place]  03/14/2016  1   03/14/2016  Oxycodone Hcl 5 Mg Tablet  60.00 6 Br Pet  [id number]  Eck ([id number])  0/0 75.00 MME Comm Ins  [place]  03/07/2016  1   03/07/2016  Oxycodone Hcl 5 Mg  Tablet  80.00 7 Br Pet  [id number]  Eck ([id number])  0/0 85.71 MME Comm Ins  [place]  01/31/2016  1   01/29/2016  Hydrocodone-Acetamin 5-325 Mg  60.00 30 Je Cop  [id number]  Eck ([id number])  0/0 10.00 MME Comm Ins  [place]  01/03/2016  1   01/03/2016  Hydrocodone-Acetamin 5-325 Mg  60.00 30 Je Cop  [id number]  Eck ([id number])  0/0       Patient Active Problem List   Diagnosis Date Noted  . Ankylosing spondylitis ([place]) 07/24/2017  . Unilateral primary osteoarthritis, right hip 03/05/2016  . Osteoarthritis of left hip 08/03/2015  . 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 Latex allergy 08/01/2015  . Weight gain due to medication 10/19/2014  . Depression with anxiety 08/06/2013  . Uses oral contraception 07/01/2012  . Lupus ([place]) 08/09/2011  . Joint pain 07/23/2011    Past Medical History:  Diagnosis Date  . Arthritis   . Depression    takes Cymbalta daily  . Joint pain   . Joint swelling   . Lupus ([place])   . Polyarthralgia  takes Humira and Methotrexate  . Seizures ([place])    hx of-as a child. No meds    Past Surgical History:  Procedure Laterality Date  . TOTAL HIP ARTHROPLASTY Left 08/03/2015   Procedure: LEFT TOTAL HIP ARTHROPLASTY;  Surgeon: Garald Balding, MD;  Location: [place];  Service: Orthopedics;  Laterality: Left;  . TOTAL HIP ARTHROPLASTY Right 03/05/2016   Procedure: TOTAL HIP ARTHROPLASTY;  Surgeon: Garald Balding, MD;  Location: [place];  Service: Orthopedics;  Laterality: Right;    Social History   Tobacco Use  . Smoking status: Never Smoker  . Smokeless tobacco: Never Used  Substance Use Topics  . Alcohol use: No  . Drug use: No    No family history on file.  Allergies  Allergen Reactions  . Latex Hives    Medication list has been reviewed and updated.  Current Outpatient Medications on File Prior to Visit  Medication Sig Dispense Refill  . HUMIRA PEN 40 MG/0.8ML PNKT once a week.     Marland Kitchen HYDROcodone-acetaminophen (NORCO/VICODIN) 5-325 MG tablet Take 1/2 or 1 at bedtime as  needed for pain 30 tablet 0  . HYDROcodone-acetaminophen (NORCO/VICODIN) 5-325 MG tablet Take 1/2 or 1 at bedtime as needed for pain.  Please fill in 30 days 30 tablet 0  . Norgestim-Eth Estrad Triphasic (ORTHO TRI-CYCLEN LO PO) Take 1 tablet by mouth daily.     . predniSONE (DELTASONE) 10 MG tablet Take 1 tablet (10 mg total) by mouth daily with breakfast. (Patient taking differently: Take 5 mg by mouth every other day. ) 30 tablet 1  . sertraline (ZOLOFT) 100 MG tablet Take 2 tablets (200 mg total) by mouth daily. 180 tablet 3  . sulfaSALAzine (AZULFIDINE) 500 MG tablet Take 1,000 mg by mouth 2 (two) times daily.      No current facility-administered medications on file prior to visit.     Review of Systems:  As per HPI- otherwise negative.   Physical Examination: There were no vitals filed for this visit. There were no vitals filed for this visit. There is no height or weight on file to calculate BMI. Ideal Body Weight:    GEN: WDWN, NAD, Non-toxic, A & O x 3 HEENT: Atraumatic, Normocephalic. Neck supple. No masses, No LAD. Ears and Nose: No external deformity. CV: RRR, No M/G/R. No JVD. No thrill. No extra heart sounds. PULM: CTA B, no wheezes, crackles, rhonchi. No retractions. No resp. distress. No accessory muscle use. ABD: S, NT, ND, +BS. No rebound. No HSM. EXTR: No c/c/e NEURO Normal gait.  PSYCH: Normally interactive. Conversant. Not depressed or anxious appearing.  Calm demeanor.    Assessment and Plan: ***  Signed Lamar Blinks, MD

## 2017-11-19 ENCOUNTER — Encounter: Payer: Self-pay | Admitting: Family Medicine

## 2017-11-19 ENCOUNTER — Ambulatory Visit: Payer: 59 | Admitting: Family Medicine

## 2017-11-20 ENCOUNTER — Encounter: Payer: Self-pay | Admitting: Family Medicine

## 2017-11-27 ENCOUNTER — Other Ambulatory Visit: Payer: Self-pay | Admitting: Family Medicine

## 2017-11-27 DIAGNOSIS — G8929 Other chronic pain: Secondary | ICD-10-CM

## 2017-11-27 MED ORDER — HYDROCODONE-ACETAMINOPHEN 5-325 MG PO TABS: ORAL_TABLET | ORAL | 0 refills | Status: DC

## 2017-11-27 NOTE — Telephone Encounter (Signed)
UDS is UTD  10/26/2017  1   09/19/2017  Hydrocodone-Acetamin 5-325 Mg  30.00 30 Je Cop  [id number]  Opt (4524)  0/0 5.00 MME Comm Ins  [place]  09/19/2017  1   09/19/2017  Hydrocodone-Acetamin 5-325 Mg  30.00 30 Je Cop  [id number]  Opt (4524)  0/0 5.00 MME Comm Ins  [place]  08/20/2017  1   08/11/2017  Hydrocodone-Acetamin 5-325 Mg  30.00 30 Je Cop  [id number]  Opt (4524)  0/0 5.00 MME Comm Ins  [place]  06/27/2017  2   06/23/2017  Hydrocodone-Acetamin 5-325 Mg  30.00 30 Je Cop  [id number]  Wal (5343)  0/0 5.00 MME Private Pay  [place]  03/25/2016  1   03/25/2016  Oxycodone Hcl 5 Mg Tablet  30.00 7 Pe Whi  [id number]  Wal (5396)  0/0 32.14 MME Comm Ins  [place]  03/14/2016  1   03/14/2016  Oxycodone Hcl 5 Mg Tablet  60.00 6 Br Pet  [id number]  Eck ([id number])  0/0      Ok to refill hydrocodone Meds ordered this encounter  Medications  . HYDROcodone-acetaminophen (NORCO/VICODIN) 5-325 MG tablet    Sig: Take 1/2 or 1 at bedtime as needed for pain    Dispense:  30 tablet    Refill:  0

## 2017-12-03 ENCOUNTER — Encounter: Payer: Self-pay | Admitting: Family Medicine

## 2018-01-05 ENCOUNTER — Ambulatory Visit (INDEPENDENT_AMBULATORY_CARE_PROVIDER_SITE_OTHER): Payer: 59 | Admitting: Orthopaedic Surgery

## 2018-01-05 ENCOUNTER — Telehealth: Payer: Self-pay | Admitting: Family Medicine

## 2018-01-05 NOTE — Telephone Encounter (Signed)
Copied from [place] [id number]. Topic: Quick Communication - See Telephone Encounter >> Jan 05, 2018  4:43 PM Blase Mess A wrote: CRM for notification. See Telephone encounter for: 01/05/18.  Dr. Janina Mayo working on behalf of [employer] is calling to request a peer to peer on the patient.Go over a few or the restrictions and limitations. The Dr. Aletha Halim try to keep it as brief as possible 2-3 minutes [phone] Confidential VM

## 2018-01-06 ENCOUNTER — Telehealth: Payer: Self-pay | Admitting: *Deleted

## 2018-01-06 IMAGING — DX DG HIP (WITH OR WITHOUT PELVIS) 1V PORT*L*
3 series · 3 of 3 positions shown · non-contrast
Comparison: Portable exam 5852 hours compared to 06/05/2015

CLINICAL DATA: Post LEFT hip replacement

EXAM:
DG HIP (WITH OR WITHOUT PELVIS) 1V PORT LEFT

[pelvis ap]
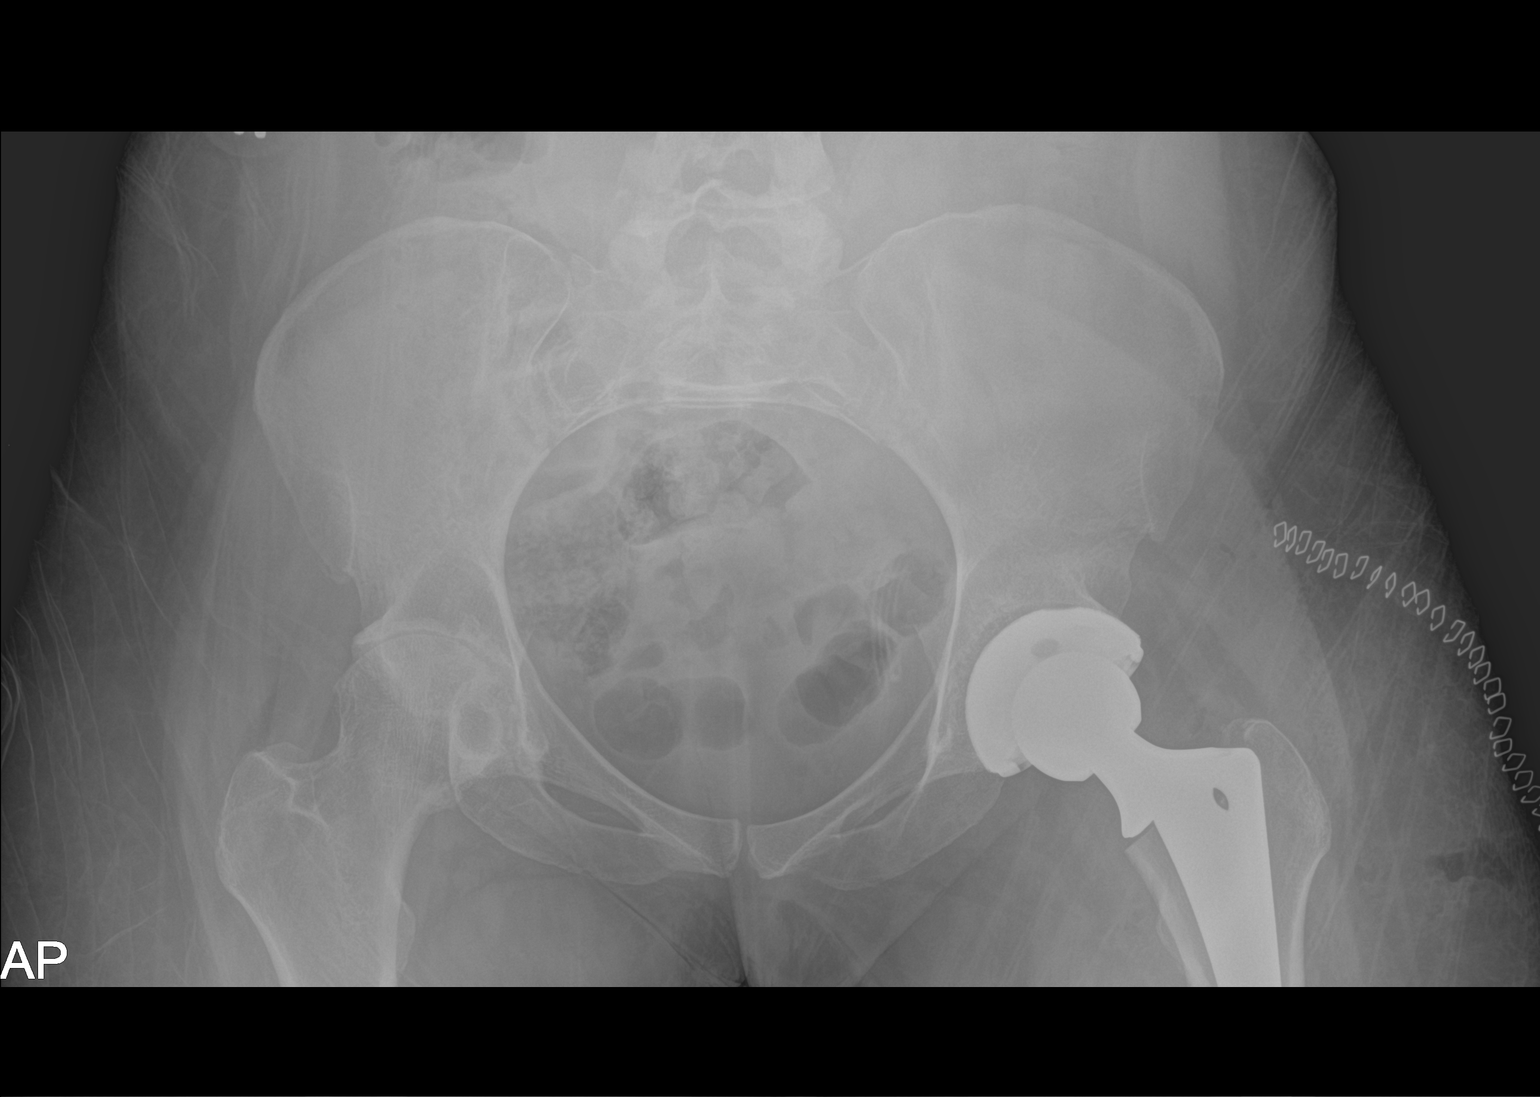

[hip ap]
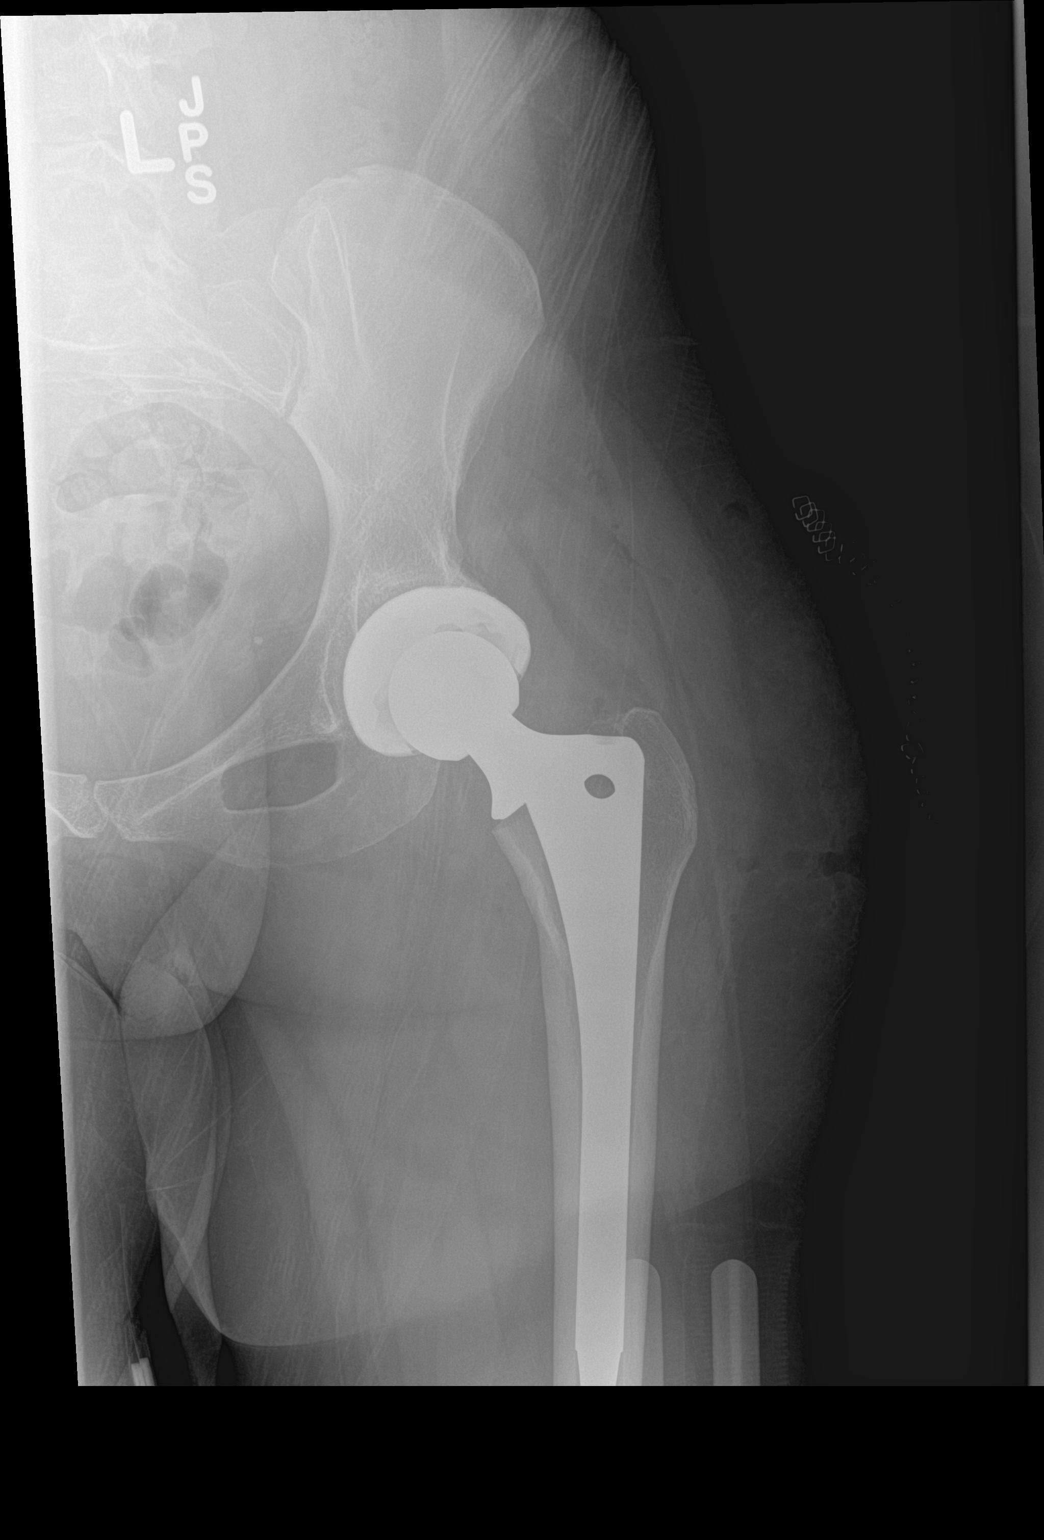

[x table lat]
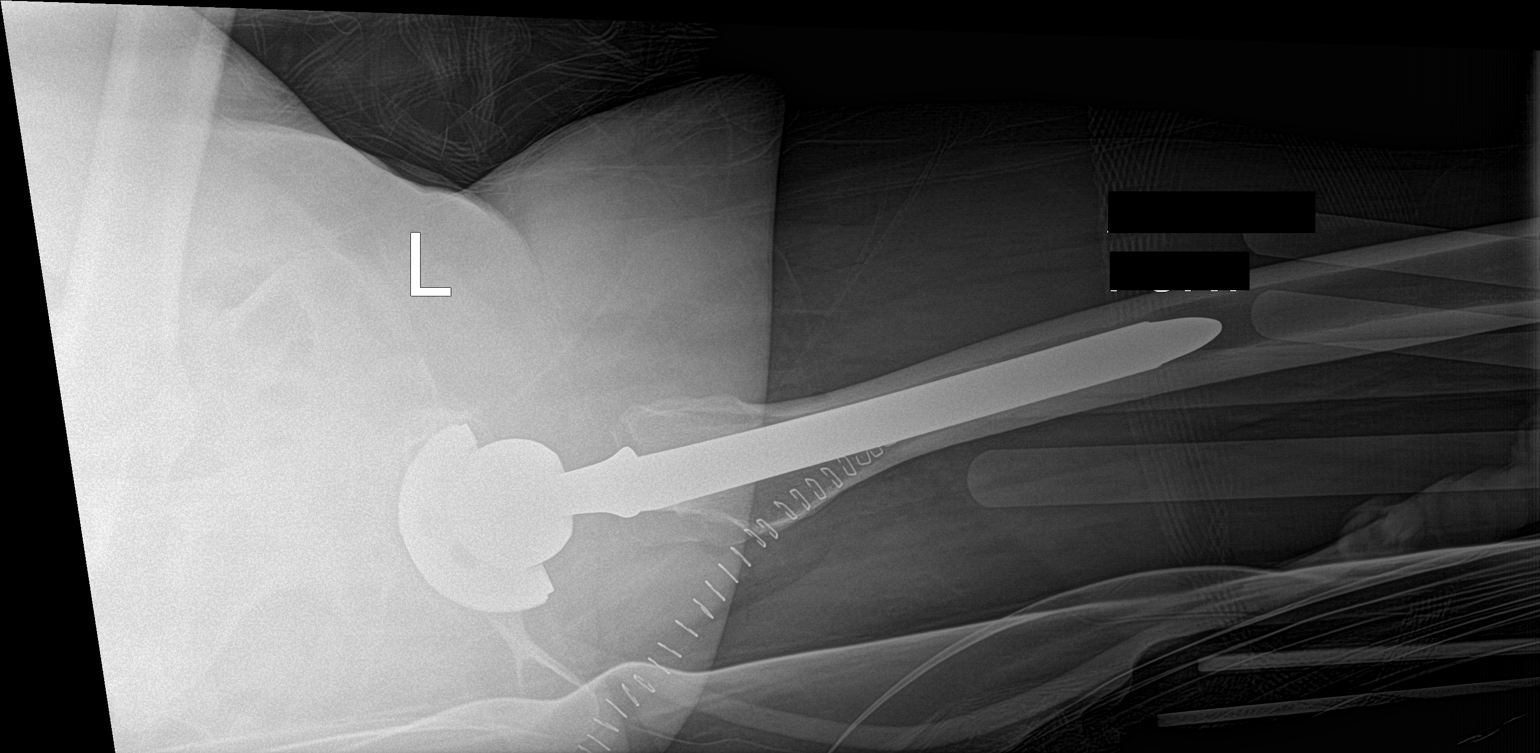

[3 of 3 positions shown; findings below may reference images not displayed]

FINDINGS: Components of a LEFT hip prosthesis are identified.

No acute fracture, dislocation or bone destruction.

Advanced osteoarthritic changes RIGHT hip.

Marked osseous demineralization.
IMPRESSION: LEFT hip prosthesis without acute complication.

Osseous demineralization with advanced osteoarthritic changes of
RIGHT hip joint.

## 2018-01-06 NOTE — Telephone Encounter (Signed)
Spoke with Dr. at [facility] on her behalf.  Answer questions about her function ability to the best of my ability.  I related that Rose Campbell has chronic hip and back pain, due to her lupus and ankylosing spondylitis. She has so far not want to use a scooter or wheelchair, but I do feel she would benefit from this.  She is not able to do a job which requires any significant standing.  Also sitting for long periods or in one position for a long time is problematic for her.  I related that she can likely walk for 5 to 10 minutes/h.  I related that it might be possible for her to work a part-time sedentary job, however there are days when her pain limits her ability to drive.

## 2018-01-06 NOTE — Telephone Encounter (Signed)
No peer-to-peer needed, per disability Dr. Arlyn Dunning notes are sufficient, patient no showed last appt, not seen since 07/2017

## 2018-01-06 NOTE — Telephone Encounter (Signed)
Peer-to-peer for patient's disability, please call [phone] to speak to doctor. Thank you.

## 2018-01-06 NOTE — Telephone Encounter (Signed)
Have not seen since July- have no new info for the disability company

## 2018-01-16 ENCOUNTER — Telehealth: Payer: Self-pay | Admitting: *Deleted

## 2018-01-16 NOTE — Telephone Encounter (Signed)
Received LTD paperwork from [employer], "As part of the claim review process, we had an [place] Consultant review your patient's claim and conduct an independent review". Enclosed is the report for provider's review and comment.If provider is not in agreement, they need to submit a written response with clinical information; forwarded to provider/SLS 12/27

## 2018-01-19 NOTE — Telephone Encounter (Signed)
Rose Campbell with [employer] and states that there is a deadline for the [employer]. Deadline is 1/320. Please advise.

## 2018-01-22 ENCOUNTER — Telehealth: Payer: Self-pay

## 2018-01-22 NOTE — Telephone Encounter (Signed)
Called her back to discuss.  Her disability claim has been denied, but she plans to appeal  She is working with a Chief Executive Officer who is helping her.  We also discussed SSD for her  Offered support and encouragement to her

## 2018-01-22 NOTE — Telephone Encounter (Signed)
I received this paperwork, and also spoke with the physician with [employer] approximately 2 weeks ago, at that time I answered questions about this patient's functional abilities.. I reviewed this paperwork, and it appears that the insurance physician disagrees with my assessment.  At this time I do not have anything further to add, as I did give my honest opinion about this patient's disability. I called the patient and left a message on her voicemail with the same information

## 2018-01-22 NOTE — Telephone Encounter (Signed)
No I have not seen it. I have nothing in my folders. Was it given to provider?

## 2018-01-22 NOTE — Telephone Encounter (Signed)
Paperwork on providers desk for review and comments.

## 2018-01-22 NOTE — Telephone Encounter (Signed)
Copied from [place] [phone]. Topic: General - Other >> Jan 22, 2018  4:35 PM Windy Kalata wrote: Reason for CRM: Patient is calling back asking to talk to Dr. Lorelei Pont in regards to her message today about her [place] paperwork. Please advise  Best call back is [phone]

## 2018-01-22 NOTE — Telephone Encounter (Signed)
Mel Almond, did you receive this from provider; please advise. Thanks/SLS 01/02

## 2018-03-25 IMAGING — XA DG FLUORO GUIDE NDL PLC/BX
1 series · 1 of 1 positions shown · non-contrast
Comparison: none

CLINICAL DATA: Right hip arthritis.  Hip pain.

[Series 1: ortho standard · 1 of 1 slices shown]
[im 1/1]
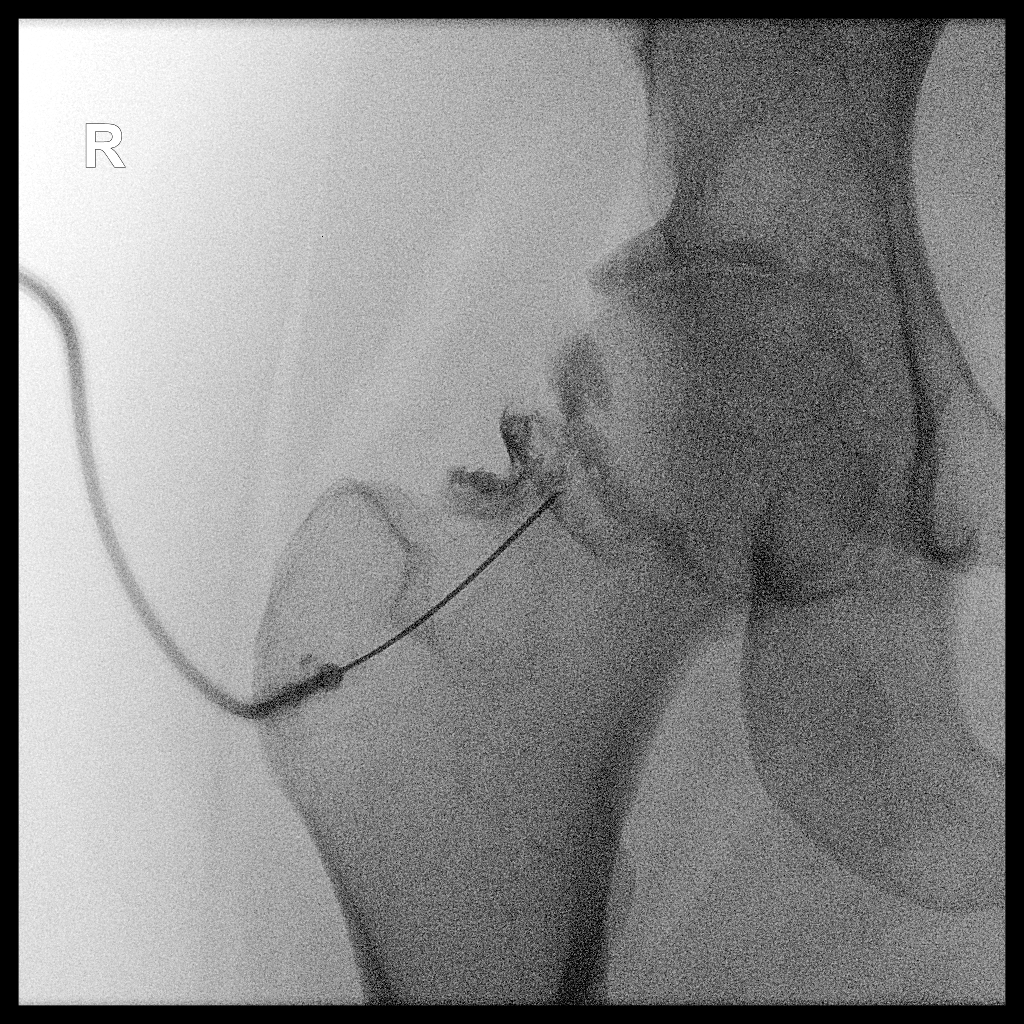

[1 of 1 positions shown; findings below may reference images not displayed]

EXAM:
RIGHT HIP INJECTION UNDER FLUOROSCOPY

FLUOROSCOPY TIME:  Fluoroscopy Time:  1 second

Radiation Exposure Index (if provided by the fluoroscopic device):
8.17 microGray*m^2

Number of Acquired Spot Images: 0

PROCEDURE:
Overlying skin prepped with Betadine, draped in the usual sterile
fashion, and infiltrated locally with buffered Lidocaine. A 3.5 inch
22 gauge spinal needle was advanced to the lateral aspect of the
right femoral head-neck junction. 1 ml of Lidocaine injected easily.
Diagnostic injection of Isovue-M 200 demonstrates intra-articular
spread without intravascular component.

120 mg of Depo-Medrol and 2 mL of 0.5% bupivacaine were then
administered. No immediate complication.
IMPRESSION: Technically successful right hip injection under fluoroscopy.

## 2018-04-23 ENCOUNTER — Encounter: Payer: Self-pay | Admitting: Family Medicine

## 2018-04-24 NOTE — Telephone Encounter (Signed)
Received Physician Consultant Review from [place] for Long -Term/Permanent Disability via [place]; forwarded to provider/SLS 04/03

## 2018-04-30 ENCOUNTER — Telehealth: Payer: Self-pay | Admitting: Family Medicine

## 2018-04-30 ENCOUNTER — Encounter: Payer: Self-pay | Admitting: Family Medicine

## 2018-04-30 NOTE — Telephone Encounter (Signed)
I received a packet of information from her disability claim. They asked for me to submit any new information that I might have. At this time I do not have anything further to add to this claim, I last saw Rose Campbell in the office in July

## 2020-12-27 ENCOUNTER — Encounter: Payer: Self-pay | Admitting: Family Medicine

## 2021-09-11 NOTE — Progress Notes (Addendum)
[place] at [facility] [address], [place], [place] [address] [phone]  Date:  09/13/2021   Name:  Rose Campbell   DOB:  1985/10/29   MRN:  [id number]  PCP:  Darreld Mclean, MD    Chief Complaint: Annual Exam (Concerns/ questions: none/Pap: sees [facility]/Hep C/ HIV screen due today)   History of Present Illness:  Rose Campbell is a 36 y.o. very pleasant female patient who presents with the following:  Patient seen today for physical exam- MEDICARE-I have not seen her in some time.  History of lupus, related significant joint disease at a young age.  Status post bilateral hip replacements Most recent visit with myself was in 2019  Her rheumatology care is through [facility].  Most recent visit in July: Assessment 1. Ankylosing spondylitis of sacral region [facility]) Comprehensive Metabolic Panel  CBC and Differential  CRP, Non-Cardiac  Sedimentation Rate (ESR)  2. Inflammatory arthritis Comprehensive Metabolic Panel  CBC and Differential  CRP, Non-Cardiac  Sedimentation Rate (ESR)  3. Drug therapy Comprehensive Metabolic Panel  CBC and Differential  CRP, Non-Cardiac  Sedimentation Rate (ESR)  Plan/Orders Continue Remicade 400 mg IV every 8 weeks, only 1 infusion so far.  Continue leflunomide 20 mg daily. Continue tylenol #3 Continue diclofenac as needed. Return in about 6 months (around 01/27/2022).  Pap screening- per her GYN  Can offer HIV and hep C screening; patient declined HIV screening as Medicare does not cover Can check cholesterol, A1c, thyroid, vitamin D  She is no longer on humira, started remicade.  She thinks this may be helping her some with her joint symptoms Pt reports she was seen at [facility] main in [place] on 08/29/21 after syncope.  She did not stay for full evaluation however.  I am unable to see these records on epic The patient provided the medical record number she was given at [facility]  [place]-this does not fit with her medical record through [facility]  She is having some sx of anxiety and depression-she is not currently taking sertraline.  She would be interested in restarting this medication.  She denies suicidal ideation  Her GYN is DR Benjie Karvonen She is on OCP for contraception Patient Active Problem List   Diagnosis Date Noted   Ankylosing spondylitis ([place]) 07/24/2017   Unilateral primary osteoarthritis, right hip 03/05/2016   Osteoarthritis of left hip 08/03/2015   S/P total hip arthroplasty 08/03/2015   Latex allergy 08/01/2015   Weight gain due to medication 10/19/2014   Depression with anxiety 08/06/2013   Uses oral contraception 07/01/2012   Lupus ([place]) 08/09/2011   Joint pain 07/23/2011    Past Medical History:  Diagnosis Date   Arthritis    Depression    takes Cymbalta daily   Joint pain    Joint swelling    Lupus ([place])    Polyarthralgia    takes Humira and Methotrexate   Seizures ([place])    hx of-as a child. No meds    Past Surgical History:  Procedure Laterality Date   TOTAL HIP ARTHROPLASTY Left 08/03/2015   Procedure: LEFT TOTAL HIP ARTHROPLASTY;  Surgeon: Garald Balding, MD;  Location: [place];  Service: Orthopedics;  Laterality: Left;   TOTAL HIP ARTHROPLASTY Right 03/05/2016   Procedure: TOTAL HIP ARTHROPLASTY;  Surgeon: Garald Balding, MD;  Location: [place];  Service: Orthopedics;  Laterality: Right;    Social History   Tobacco Use   Smoking  status: Never   Smokeless tobacco: Never  Vaping Use   Vaping Use: Never used  Substance Use Topics   Alcohol use: No   Drug use: No    No family history on file.  Allergies  Allergen Reactions   Latex Hives    Medication list has been reviewed and updated.  Current Outpatient Medications on File Prior to Visit  Medication Sig Dispense Refill   HYDROcodone-acetaminophen (NORCO/VICODIN) 5-325 MG tablet Take 1/2 or 1 at bedtime as needed for pain.  Please fill in 30 days 30 tablet 0   No  current facility-administered medications on file prior to visit.    Review of Systems:  As per HPI- otherwise negative.   Physical Examination: Vitals:   09/13/21 0908  BP: 118/70  Pulse: 93  Resp: 18  Temp: 97.8 F (36.6 C)  SpO2: 98%   Vitals:   09/13/21 0908  Weight: 161 lb 9.6 oz (73.3 kg)  Height: 5' 1"  (1.549 m)   Body mass index is 30.53 kg/m. Ideal Body Weight: Weight in (lb) to have BMI = 25: 132  GEN: no acute distress.  Mild obesity, looks well HEENT: Atraumatic, Normocephalic.  Ears and Nose: No external deformity. CV: RRR, No M/G/R. No JVD. No thrill. No extra heart sounds. PULM: CTA B, no wheezes, crackles, rhonchi. No retractions. No resp. distress. No accessory muscle use. ABD: S, NT, ND, +BS. No rebound. No HSM. EXTR: No c/c/e PSYCH: Normally interactive. Conversant.  Uses cane for support  Assessment and Plan: Screening for deficiency anemia  Routine screening for STI (sexually transmitted infection) - Plan: Hepatitis C antibody, RPR, Hepatitis B surface antibody,quantitative, Hepatitis B surface antigen, CANCELED: HIV Antibody (routine testing w rflx)  Screening for diabetes mellitus - Plan: Hemoglobin A1c  Screening for hyperlipidemia - Plan: Lipid panel  Fatigue, unspecified type - Plan: VITAMIN D 25 Hydroxy (Vit-D Deficiency, Fractures)  Adjustment reaction with anxiety and depression - Plan: sertraline (ZOLOFT) 100 MG tablet  Elevated glucose - Plan: Hemoglobin A1c  Vitamin D deficiency - Plan: VITAMIN D 25 Hydroxy (Vit-D Deficiency, Fractures), Cholecalciferol (VITAMIN D3) 1.25 MG (50000 UT) CAPS  Physical exam  Patient seen today for follow-up/Medicare physical Routine labs are pending as above Will plan further follow- up pending labs. Start back on sertraline 50 mg, increase to 100 after 1 to 2 weeks  Signed Lamar Blinks, MD  Received labs as below, message to patient Results for orders placed or performed in visit on  09/13/21  Hemoglobin A1c  Result Value Ref Range   Hgb A1c MFr Bld 4.6 4.6 - 6.5 %  Lipid panel  Result Value Ref Range   Cholesterol 151 0 - 200 mg/dL   Triglycerides 170.0 (H) 0.0 - 149.0 mg/dL   HDL 61.90 >39.00 mg/dL   VLDL 34.0 0.0 - 40.0 mg/dL   LDL Cholesterol 55 0 - 99 mg/dL   Total CHOL/HDL Ratio 2    NonHDL 88.72   VITAMIN D 25 Hydroxy (Vit-D Deficiency, Fractures)  Result Value Ref Range   VITD <7.00 (L) 30.00 - 100.00 ng/mL  Hepatitis C antibody  Result Value Ref Range   Hepatitis C Ab NON-REACTIVE NON-REACTIVE  Hepatitis B surface antibody,quantitative  Result Value Ref Range   Hep B S AB Quant (Post) <5 (L) > OR = 10 mIU/mL  Hepatitis B surface antigen  Result Value Ref Range   Hepatitis B Surface Ag NON-REACTIVE NON-REACTIVE    Received lab updates 8/25-message to patient

## 2021-09-13 ENCOUNTER — Encounter: Payer: Self-pay | Admitting: Family Medicine

## 2021-09-13 ENCOUNTER — Ambulatory Visit (INDEPENDENT_AMBULATORY_CARE_PROVIDER_SITE_OTHER): Payer: Medicare Other | Admitting: Family Medicine

## 2021-09-13 VITALS — BP 118/70 | HR 93 | Temp 97.8°F | Resp 18 | Ht 61.0 in | Wt 161.6 lb

## 2021-09-13 DIAGNOSIS — F4323 Adjustment disorder with mixed anxiety and depressed mood: Secondary | ICD-10-CM

## 2021-09-13 DIAGNOSIS — Z136 Encounter for screening for cardiovascular disorders: Secondary | ICD-10-CM | POA: Diagnosis not present

## 2021-09-13 DIAGNOSIS — Z113 Encounter for screening for infections with a predominantly sexual mode of transmission: Secondary | ICD-10-CM | POA: Diagnosis not present

## 2021-09-13 DIAGNOSIS — R7309 Other abnormal glucose: Secondary | ICD-10-CM | POA: Diagnosis not present

## 2021-09-13 DIAGNOSIS — R5383 Other fatigue: Secondary | ICD-10-CM

## 2021-09-13 DIAGNOSIS — D849 Immunodeficiency, unspecified: Secondary | ICD-10-CM

## 2021-09-13 DIAGNOSIS — Z130 Encounter for screening for diseases of the blood and blood-forming organs and certain disorders involving the immune mechanism: Secondary | ICD-10-CM

## 2021-09-13 DIAGNOSIS — Z131 Encounter for screening for diabetes mellitus: Secondary | ICD-10-CM | POA: Diagnosis not present

## 2021-09-13 DIAGNOSIS — E559 Vitamin D deficiency, unspecified: Secondary | ICD-10-CM

## 2021-09-13 DIAGNOSIS — Z13220 Encounter for screening for lipoid disorders: Secondary | ICD-10-CM | POA: Diagnosis not present

## 2021-09-13 DIAGNOSIS — Z0000 Encounter for general adult medical examination without abnormal findings: Secondary | ICD-10-CM

## 2021-09-13 LAB — LIPID PANEL
Cholesterol: 151 mg/dL (ref 0–200)
HDL: 61.9 mg/dL (ref >39.00)
LDL Cholesterol: 55 mg/dL (ref 0–99)
NonHDL: 88.72
Total CHOL/HDL Ratio: 2
Triglycerides: 170 mg/dL — ABNORMAL HIGH (ref 0.0–149.0)
VLDL: 34 mg/dL (ref 0.0–40.0)

## 2021-09-13 LAB — VITAMIN D 25 HYDROXY (VIT D DEFICIENCY, FRACTURES): VITD: <7 ng/mL — ABNORMAL LOW (ref 30.00–100.00)

## 2021-09-13 LAB — HEMOGLOBIN A1C: Hgb A1c MFr Bld: 4.6 % (ref 4.6–6.5)

## 2021-09-13 MED ORDER — SERTRALINE HCL 100 MG PO TABS: 100.0000 mg | ORAL_TABLET | Freq: Every day | ORAL | 3 refills | Status: DC

## 2021-09-13 MED ORDER — VITAMIN D3 1.25 MG (50000 UT) PO CAPS: ORAL_CAPSULE | ORAL | 0 refills | Status: DC

## 2021-09-13 NOTE — Patient Instructions (Addendum)
Good to see you today- I will be in touch with your labs asap Let's have you start back on sertraline - start with 50 mg and increase to a whole tablet after a week as needed Medicare may not pay for HIV screening but we will try to bill them!   Please let me know your MRN from recent ER visit and I will look into this for you

## 2021-09-14 ENCOUNTER — Encounter: Payer: Self-pay | Admitting: Family Medicine

## 2021-09-14 LAB — HEPATITIS B SURFACE ANTIGEN: Hepatitis B Surface Ag: NONREACTIVE

## 2021-09-14 LAB — HEPATITIS C ANTIBODY: Hepatitis C Ab: NONREACTIVE

## 2021-09-14 LAB — HEPATITIS B SURFACE ANTIBODY, QUANTITATIVE: Hep B S AB Quant (Post): <5 m[IU]/mL — ABNORMAL LOW (ref >=10)

## 2021-09-14 LAB — RPR: RPR Ser Ql: NONREACTIVE

## 2021-09-14 NOTE — Telephone Encounter (Signed)
Faxed

## 2021-09-27 ENCOUNTER — Encounter: Payer: Self-pay | Admitting: Family Medicine

## 2022-02-21 ENCOUNTER — Telehealth: Payer: Self-pay | Admitting: Family Medicine

## 2022-02-21 NOTE — Telephone Encounter (Signed)
Copied from [place] [phone]. Topic: Medicare AWV >> Feb 21, 2022 12:37 PM Gillis Santa wrote: Reason for CRM: LVM PATIENT TO CALL [phone] SCHEDULE AWV WITH HEALTH COACH TINA TELE VISIT ONLY

## 2022-03-04 ENCOUNTER — Telehealth: Payer: Medicare Other | Admitting: Physician Assistant

## 2022-03-04 DIAGNOSIS — B9689 Other specified bacterial agents as the cause of diseases classified elsewhere: Secondary | ICD-10-CM

## 2022-03-04 DIAGNOSIS — J028 Acute pharyngitis due to other specified organisms: Secondary | ICD-10-CM | POA: Diagnosis not present

## 2022-03-04 MED ORDER — AMOXICILLIN 500 MG PO CAPS: 500.0000 mg | ORAL_CAPSULE | Freq: Two times a day (BID) | ORAL | 0 refills | Status: AC

## 2022-03-04 NOTE — Progress Notes (Signed)
Virtual Visit Consent   Rose Campbell, you are scheduled for a virtual visit with a [facility] provider today. Just as with appointments in the office, your consent must be obtained to participate. Your consent will be active for this visit and any virtual visit you may have with one of our providers in the next 365 days. If you have a MyChart account, a copy of this consent can be sent to you electronically.  As this is a virtual visit, video technology does not allow for your provider to perform a traditional examination. This may limit your provider's ability to fully assess your condition. If your provider identifies any concerns that need to be evaluated in person or the need to arrange testing (such as labs, EKG, etc.), we will make arrangements to do so. Although advances in technology are sophisticated, we cannot ensure that it will always work on either your end or our end. If the connection with a video visit is poor, the visit may have to be switched to a telephone visit. With either a video or telephone visit, we are not always able to ensure that we have a secure connection.  By engaging in this virtual visit, you consent to the provision of healthcare and authorize for your insurance to be billed (if applicable) for the services provided during this visit. Depending on your insurance coverage, you may receive a charge related to this service.  I need to obtain your verbal consent now. Are you willing to proceed with your visit today? Leilani Cherubini has provided verbal consent on 03/04/2022 for a virtual visit (video or telephone). Mar Daring, PA-C  Date: 03/04/2022 6:50 PM  Virtual Visit via Video Note   I, Mar Daring, connected with  Rose Campbell  ([id number], 1985/08/05) on 03/04/22 at  6:45 PM EST by a video-enabled telemedicine application and verified that I am speaking with the correct person using two identifiers.  Location: Patient: Virtual Visit Location  Patient: Home Provider: Virtual Visit Location Provider: Home Office   I discussed the limitations of evaluation and management by telemedicine and the availability of in person appointments. The patient expressed understanding and agreed to proceed.    History of Present Illness: Rose Campbell is a 37 y.o. who identifies as a female who was assigned female at birth, and is being seen today for sore throat.  HPI: Sore Throat  This is a new problem. The current episode started in the past 7 days (past few days then woke up this morning with hoarse voice). The problem has been gradually worsening. There has been no fever. Associated symptoms include coughing (mild), a hoarse voice and trouble swallowing. Pertinent negatives include no congestion, diarrhea, ear discharge, ear pain, headaches, neck pain, shortness of breath, swollen glands or vomiting. She has had no exposure to strep or mono. She has tried cool liquids and acetaminophen (cough drops) for the symptoms. The treatment provided no relief.     Problems:  Patient Active Problem List   Diagnosis Date Noted   Ankylosing spondylitis ([place]) 07/24/2017   Unilateral primary osteoarthritis, right hip 03/05/2016   Osteoarthritis of left hip 08/03/2015   S/P total hip arthroplasty 08/03/2015   Latex allergy 08/01/2015   Weight gain due to medication 10/19/2014   Depression with anxiety 08/06/2013   Uses oral contraception 07/01/2012   Lupus ([place]) 08/09/2011   Joint pain 07/23/2011    Allergies:  Allergies  Allergen Reactions   Latex Hives   Medications:  Current  Outpatient Medications:    amoxicillin (AMOXIL) 500 MG capsule, Take 1 capsule (500 mg total) by mouth 2 (two) times daily for 10 days., Disp: 20 capsule, Rfl: 0   Cholecalciferol (VITAMIN D3) 1.25 MG (50000 UT) CAPS, Take 1 weekly for 12 weeks, Disp: 12 capsule, Rfl: 0   HYDROcodone-acetaminophen (NORCO/VICODIN) 5-325 MG tablet, Take 1/2 or 1 at bedtime as needed for pain.   Please fill in 30 days, Disp: 30 tablet, Rfl: 0   sertraline (ZOLOFT) 100 MG tablet, Take 1 tablet (100 mg total) by mouth daily., Disp: 90 tablet, Rfl: 3  Observations/Objective: Patient is well-developed, well-nourished in no acute distress.  Resting comfortably at home.  Head is normocephalic, atraumatic.  No labored breathing.  Speech is clear and coherent with logical content.  Patient is alert and oriented at baseline.    Assessment and Plan: 1. Acute bacterial pharyngitis - amoxicillin (AMOXIL) 500 MG capsule; Take 1 capsule (500 mg total) by mouth 2 (two) times daily for 10 days.  Dispense: 20 capsule; Refill: 0  - Suspect bacterial pharyngitis - Amoxil prescribed - Tylenol and Ibuprofen alternating every 4 hours - Salt water gargles - Chloraseptic spray - Liquid and soft food diet - Push fluids - New toothbrush in 3 days - Seek in person evaluation if not improving or if symptoms worsen   Follow Up Instructions: I discussed the assessment and treatment plan with the patient. The patient was provided an opportunity to ask questions and all were answered. The patient agreed with the plan and demonstrated an understanding of the instructions.  A copy of instructions were sent to the patient via MyChart unless otherwise noted below.    The patient was advised to call back or seek an in-person evaluation if the symptoms worsen or if the condition fails to improve as anticipated.  Time:  I spent 8 minutes with the patient via telehealth technology discussing the above problems/concerns.    Mar Daring, PA-C

## 2022-03-04 NOTE — Patient Instructions (Signed)
Rose Campbell, thank you for joining Mar Daring, PA-C for today's virtual visit.  While this provider is not your primary care provider (PCP), if your PCP is located in our provider database this encounter information will be shared with them immediately following your visit.   [facility] account gives you access to today's visit and all your visits, tests, and labs performed at [facility] " click here if you don't have a [facility] account or go to [email or web address]  Consent: (Patient) Rose Campbell provided verbal consent for this virtual visit at the beginning of the encounter.  Current Medications:  Current Outpatient Medications:    amoxicillin (AMOXIL) 500 MG capsule, Take 1 capsule (500 mg total) by mouth 2 (two) times daily for 10 days., Disp: 20 capsule, Rfl: 0   Cholecalciferol (VITAMIN D3) 1.25 MG (50000 UT) CAPS, Take 1 weekly for 12 weeks, Disp: 12 capsule, Rfl: 0   HYDROcodone-acetaminophen (NORCO/VICODIN) 5-325 MG tablet, Take 1/2 or 1 at bedtime as needed for pain.  Please fill in 30 days, Disp: 30 tablet, Rfl: 0   sertraline (ZOLOFT) 100 MG tablet, Take 1 tablet (100 mg total) by mouth daily., Disp: 90 tablet, Rfl: 3   Medications ordered in this encounter:  Meds ordered this encounter  Medications   amoxicillin (AMOXIL) 500 MG capsule    Sig: Take 1 capsule (500 mg total) by mouth 2 (two) times daily for 10 days.    Dispense:  20 capsule    Refill:  0    Order Specific Question:   Supervising Provider    Answer:   Chase Picket [id number]     *If you need refills on other medications prior to your next appointment, please contact your pharmacy*  Follow-Up: Call back or seek an in-person evaluation if the symptoms worsen or if the condition fails to improve as anticipated.  [place] [phone]  Other Instructions  Strep Throat, Adult Strep throat is an infection in the throat that is  caused by bacteria. It is common during the cold months of the year. It mostly affects children who are 93-93 years old. However, people of all ages can get it at any time of the year. This infection spreads from person to person (is contagious) through coughing, sneezing, or having close contact. Your health care provider may use other names to describe the infection. When strep throat affects the tonsils, it is called tonsillitis. When it affects the back of the throat, it is called pharyngitis. What are the causes? This condition is caused by the Streptococcus pyogenes bacteria. What increases the risk? You are more likely to develop this condition if: You care for school-age children, or are around school-age children. Children are more likely to get strep throat and may spread it to others. You spend time in crowded places where the infection can spread easily. You have close contact with someone who has strep throat. What are the signs or symptoms? Symptoms of this condition include: Fever or chills. Redness, swelling, or pain in the tonsils or throat. Pain or difficulty when swallowing. White or yellow spots on the tonsils or throat. Tender glands in the neck and under the jaw. Bad smelling breath. Red rash all over the body. This is rare. How is this diagnosed? This condition is diagnosed by tests that check for the presence and the amount of bacteria that cause strep throat. They are: Rapid strep test. Your throat is swabbed and checked  for the presence of bacteria. Results are usually ready in minutes. Throat culture test. Your throat is swabbed. The sample is placed in a cup that allows infections to grow. Results are usually ready in 1 or 2 days. How is this treated? This condition may be treated with: Medicines that kill germs (antibiotics). Medicines that relieve pain or fever. These include: Ibuprofen or acetaminophen. Aspirin, only for people who are over the age of  54. Throat lozenges. Throat sprays. Follow these instructions at home: Medicines  Take over-the-counter and prescription medicines only as told by your health care provider. Take your antibiotic medicine as told by your health care provider. Do not stop taking the antibiotic even if you start to feel better. Eating and drinking  If you have trouble swallowing, try eating soft foods until your sore throat feels better. Drink enough fluid to keep your urine pale yellow. To help relieve pain, you may have: Warm fluids, such as soup and tea. Cold fluids, such as frozen desserts or popsicles. General instructions Gargle with a salt-water mixture 3-4 times a day or as needed. To make a salt-water mixture, completely dissolve -1 tsp (3-6 g) of salt in 1 cup (237 mL) of warm water. Get plenty of rest. Stay home from work or school until you have been taking antibiotics for 24 hours. Do not use any products that contain nicotine or tobacco. These products include cigarettes, chewing tobacco, and vaping devices, such as e-cigarettes. If you need help quitting, ask your health care provider. It is up to you to get your test results. Ask your health care provider, or the department that is doing the test, when your results will be ready. Keep all follow-up visits. This is important. How is this prevented?  Do not share food, drinking cups, or personal items that could cause the infection to spread to other people. Wash your hands often with soap and water for at least 20 seconds. If soap and water are not available, use hand sanitizer. Make sure that all people in your house wash their hands well. Have family members tested if they have a sore throat or fever. They may need an antibiotic if they have strep throat. Contact a health care provider if: You have swelling in your neck that keeps getting bigger. You develop a rash, cough, or earache. You cough up a thick mucus that is green,  yellow-brown, or bloody. You have pain or discomfort that does not get better with medicine. Your symptoms seem to be getting worse. You have a fever. Get help right away if: You have new symptoms, such as vomiting, severe headache, stiff or painful neck, chest pain, or shortness of breath. You have severe throat pain, drooling, or changes in your voice. You have swelling of the neck, or the skin on the neck becomes red and tender. You have signs of dehydration, such as tiredness (fatigue), dry mouth, and decreased urination. You become increasingly sleepy, or you cannot wake up completely. Your joints become red or painful. These symptoms may represent a serious problem that is an emergency. Do not wait to see if the symptoms will go away. Get medical help right away. Call your local emergency services (911 in the [place] Do not drive yourself to the hospital. Summary Strep throat is an infection in the throat that is caused by the Streptococcus pyogenes bacteria. This infection is spread from person to person (is contagious) through coughing, sneezing, or having close contact. Take your medicines, including antibiotics,  as told by your health care provider. Do not stop taking the antibiotic even if you start to feel better. To prevent the spread of germs, wash your hands well with soap and water. Have others do the same. Do not share food, drinking cups, or personal items. Get help right away if you have new symptoms, such as vomiting, severe headache, stiff or painful neck, chest pain, or shortness of breath. This information is not intended to replace advice given to you by your health care provider. Make sure you discuss any questions you have with your health care provider. Document Revised: 05/02/2020 Document Reviewed: 05/02/2020 Elsevier Patient Education  [place].    If you have been instructed to have an in-person evaluation today at a local Urgent Care facility, please  use the link below. It will take you to a list of all of our available [facility], including address, phone number and hours of operation. Please do not delay care.  [facility]  If you or a family member do not have a primary care provider, use the link below to schedule a visit and establish care. When you choose a [facility] primary care physician or advanced practice provider, you gain a long-term partner in health. Find a Primary Care Provider  Learn more about [facility] in-office and virtual care options: [facility]

## 2022-03-29 ENCOUNTER — Encounter: Payer: Self-pay | Admitting: Family Medicine

## 2022-03-29 DIAGNOSIS — F4323 Adjustment disorder with mixed anxiety and depressed mood: Secondary | ICD-10-CM

## 2022-03-29 MED ORDER — SERTRALINE HCL 100 MG PO TABS: 100.0000 mg | ORAL_TABLET | Freq: Every day | ORAL | 0 refills | Status: DC

## 2022-08-21 ENCOUNTER — Encounter: Payer: Self-pay | Admitting: Family Medicine

## 2022-08-21 ENCOUNTER — Encounter (INDEPENDENT_AMBULATORY_CARE_PROVIDER_SITE_OTHER): Payer: Self-pay

## 2022-08-21 ENCOUNTER — Telehealth: Payer: Self-pay

## 2022-08-21 NOTE — Telephone Encounter (Signed)
Pt sent a message via the scheduling tool to let us know that she would be needing to address weight, skin rashes/side effects from infusions, new antidepressant at her physical that is scheduled for the end of August. Front office asked if she needed to f/u with Rheumatology about the infusion issues since they are manages that?  Please advise:

## 2022-09-14 NOTE — Patient Instructions (Incomplete)
Good to see you again today Recommend covid booster and flu shot this fall   I will be in touch with your labs ASAP  I ordered an underarm/breast ultrasound to be done at the [facility] imaging breast center.  Please let me know if you do not hear from them by the end of the week  Try taking the full 100 mg of sertraline for a few weeks, let me know how this works for you  Please be sure to let your rheumatologist know about increased pain in your knee, they may need to do some imaging

## 2022-09-14 NOTE — Progress Notes (Unsigned)
[facility] at [facility] [address], [address] [place], [place] [address] [phone]  Date:  09/16/2022   Name:  Rose Campbell   DOB:  [birth year]   MRN:  [id number]  PCP:  Pearline Cables, MD    Chief Complaint: Traditional Medicare OV (AWV due/HIV screen due/Pap- [facility]/Flu shot today: declines)   History of Present Illness:  Rose Campbell is a 37 y.o. very pleasant female patient who presents with the following:  Pt seen today for a medicare recheck visit  Last seen by myself about one year ago   History of lupus, related significant joint disease at a young age. Status post bilateral hip replacements, she also has other joint pain Rheumatology per [facility]- she was seen in March  1. Ankylosing spondylitis of sacral region (HCC) 2. Inflammatory arthritis 3. Drug therapy  Plan/Orders Apply for Remicade IV 400 mg every 6 weeks as symptoms return before 8 week infusion. Continue leflunomide, voltaren, tylenol #3. High risk medications, patient understands and agrees. Labs every 3 months. Return in about 6 months (around 09/22/2022).   Pap- UTD per Dr Evorn Gong- sone done per [facility] recently, CRP, ers, cmp, cbc She notes her infusion was recently moved to every 6 weeks as opposed to every 8 weeks  No family history of breast cancer - she prefers to start screening at age 62  Rose Campbell is here today with her sister.  She states she no longer drives, she does not feel confident in her strength or coordination to operate a motor vehicle.  She is not working.  She does most of her shopping, grocery delivery etc. online.  Patient Active Problem List   Diagnosis Date Noted   Ankylosing spondylitis (HCC) 07/24/2017   Unilateral primary osteoarthritis, right hip 03/05/2016   Osteoarthritis of left hip 08/03/2015   S/P total hip arthroplasty 08/03/2015   Latex allergy 08/01/2015   Weight gain due to medication 10/19/2014    Depression with anxiety 08/06/2013   Uses oral contraception 07/01/2012   Lupus (HCC) 08/09/2011   Joint pain 07/23/2011    Past Medical History:  Diagnosis Date   Arthritis    Depression    takes Cymbalta daily   Joint pain    Joint swelling    Lupus (HCC)    Polyarthralgia    takes Humira and Methotrexate   Seizures (HCC)    hx of-as a child. No meds    Past Surgical History:  Procedure Laterality Date   TOTAL HIP ARTHROPLASTY Left 08/03/2015   Procedure: LEFT TOTAL HIP ARTHROPLASTY;  Surgeon: Valeria Batman, MD;  Location: [facility] OR;  Service: Orthopedics;  Laterality: Left;   TOTAL HIP ARTHROPLASTY Right 03/05/2016   Procedure: TOTAL HIP ARTHROPLASTY;  Surgeon: Valeria Batman, MD;  Location: [facility] OR;  Service: Orthopedics;  Laterality: Right;    Social History   Tobacco Use   Smoking status: Never   Smokeless tobacco: Never  Vaping Use   Vaping status: Never Used  Substance Use Topics   Alcohol use: No   Drug use: No    No family history on file.  Allergies  Allergen Reactions   Latex Hives    Medication list has been reviewed and updated.  Current Outpatient Medications on File Prior to Visit  Medication Sig Dispense Refill   acetaminophen-codeine (TYLENOL #3) 300-30 MG tablet Take by mouth.     diclofenac (VOLTAREN) 50 MG EC tablet Take 50  mg by mouth 2 (two) times daily.     HYDROcodone-acetaminophen (NORCO/VICODIN) 5-325 MG tablet Take 1/2 or 1 at bedtime as needed for pain.  Please fill in 30 days 30 tablet 0   leflunomide (ARAVA) 20 MG tablet Take 1 tablet by mouth daily.     sertraline (ZOLOFT) 100 MG tablet Take 1 tablet (100 mg total) by mouth daily. 90 tablet 0   TRI-LO-MILI 0.18/0.215/0.25 MG-25 MCG tab Take 1 tablet by mouth daily.     No current facility-administered medications on file prior to visit.    Review of Systems:  As per HPI- otherwise negative.   Physical Examination: Vitals:   09/16/22 0859  BP: 124/60  Pulse: 80   Resp: 18  Temp: (!) 97.3 F (36.3 C)  SpO2: 97%   Vitals:   09/16/22 0859  Weight: 154 lb (69.9 kg)  Height: 5\' 1"  (1.549 m)   Body mass index is 29.1 kg/m. Ideal Body Weight: Weight in (lb) to have BMI = 25: 132  GEN: no acute distress.  Overweight, looks well HEENT: Atraumatic, Normocephalic.  Bilateral TM wnl, oropharynx normal.  PEERL,EOMI.   Ears and Nose: No external deformity. CV: RRR, No M/G/R. No JVD. No thrill. No extra heart sounds. PULM: CTA B, no wheezes, crackles, rhonchi. No retractions. No resp. distress. No accessory muscle use. ABD: S, NT, ND, +BS. No rebound. No HSM. EXTR: No c/c/e PSYCH: Normally interactive. Conversant.  Uses cane for ambulation Patient points out "swelling" in her right axillary area.  It has been present for about 2 months, mildly tender On exam, she does have a firm and slightly tender mass in the right axilla.  Left axilla, breast exam normal bilaterally  Assessment and Plan: Screening for hyperlipidemia - Plan: Lipid panel  Screening for diabetes mellitus - Plan: Hemoglobin A1c  Vitamin D deficiency - Plan: VITAMIN D 25 Hydroxy (Vit-D Deficiency, Fractures)  Fatigue, unspecified type - Plan: TSH, VITAMIN D 25 Hydroxy (Vit-D Deficiency, Fractures)  Thyroid disorder screening - Plan: TSH  Lupus (HCC)  Ankylosing spondylitis, unspecified site of spine (HCC)  Adjustment reaction with anxiety and depression - Plan: sertraline (ZOLOFT) 100 MG tablet  Axillary swelling - Plan: US BREAST COMPLETE UNI RIGHT INC AXILLA  Eczema, unspecified type - Plan: triamcinolone cream (KENALOG) 0.1 %  Patient seen today for follow-up.  She unfortunately suffers from lupus and ankylosing spondylitis which has had significant effect on her overall health and mobility She does suffer from depression symptoms, is currently taking sertraline 50 mg.  We will have her try increasing to 100, she will let me know how this works for her  Ordered right  axillary ultrasound Routine lab work pending as above   Signed Abbe Amsterdam, MD  Received labs, message to patient  Results for orders placed or performed in visit on 09/16/22  Hemoglobin A1c  Result Value Ref Range   Hgb A1c MFr Bld 4.4 (L) 4.6 - 6.5 %  TSH  Result Value Ref Range   TSH 1.35 0.35 - 5.50 uIU/mL  Lipid panel  Result Value Ref Range   Cholesterol 162 0 - 200 mg/dL   Triglycerides 130.8 0.0 - 149.0 mg/dL   HDL 65.78 >46.96 mg/dL   VLDL 29.5 0.0 - 28.4 mg/dL   LDL Cholesterol 75 0 - 99 mg/dL   Total CHOL/HDL Ratio 3    NonHDL 100.81   VITAMIN D 25 Hydroxy (Vit-D Deficiency, Fractures)  Result Value Ref Range   VITD 68.09 30.00 -  100.00 ng/mL

## 2022-09-16 ENCOUNTER — Ambulatory Visit (INDEPENDENT_AMBULATORY_CARE_PROVIDER_SITE_OTHER): Payer: Medicare Other | Admitting: Family Medicine

## 2022-09-16 ENCOUNTER — Encounter: Payer: Self-pay | Admitting: Family Medicine

## 2022-09-16 VITALS — BP 124/60 | HR 80 | Temp 97.3°F | Resp 18 | Ht 61.0 in | Wt 154.0 lb

## 2022-09-16 DIAGNOSIS — Z131 Encounter for screening for diabetes mellitus: Secondary | ICD-10-CM

## 2022-09-16 DIAGNOSIS — L309 Dermatitis, unspecified: Secondary | ICD-10-CM

## 2022-09-16 DIAGNOSIS — R5383 Other fatigue: Secondary | ICD-10-CM | POA: Diagnosis not present

## 2022-09-16 DIAGNOSIS — Z13220 Encounter for screening for lipoid disorders: Secondary | ICD-10-CM | POA: Diagnosis not present

## 2022-09-16 DIAGNOSIS — Z1329 Encounter for screening for other suspected endocrine disorder: Secondary | ICD-10-CM | POA: Diagnosis not present

## 2022-09-16 DIAGNOSIS — E559 Vitamin D deficiency, unspecified: Secondary | ICD-10-CM

## 2022-09-16 DIAGNOSIS — F4323 Adjustment disorder with mixed anxiety and depressed mood: Secondary | ICD-10-CM

## 2022-09-16 DIAGNOSIS — M459 Ankylosing spondylitis of unspecified sites in spine: Secondary | ICD-10-CM

## 2022-09-16 DIAGNOSIS — M329 Systemic lupus erythematosus, unspecified: Secondary | ICD-10-CM

## 2022-09-16 DIAGNOSIS — M7989 Other specified soft tissue disorders: Secondary | ICD-10-CM

## 2022-09-16 LAB — LIPID PANEL
Cholesterol: 162 mg/dL (ref 0–200)
HDL: 61.3 mg/dL (ref >39.00)
LDL Cholesterol: 75 mg/dL (ref 0–99)
NonHDL: 100.81
Total CHOL/HDL Ratio: 3
Triglycerides: 127 mg/dL (ref 0.0–149.0)
VLDL: 25.4 mg/dL (ref 0.0–40.0)

## 2022-09-16 LAB — HEMOGLOBIN A1C: Hgb A1c MFr Bld: 4.4 % — ABNORMAL LOW (ref 4.6–6.5)

## 2022-09-16 LAB — TSH: TSH: 1.35 u[IU]/mL (ref 0.35–5.50)

## 2022-09-16 LAB — VITAMIN D 25 HYDROXY (VIT D DEFICIENCY, FRACTURES): VITD: 68.09 ng/mL (ref 30.00–100.00)

## 2022-09-16 MED ORDER — TRIAMCINOLONE ACETONIDE 0.1 % EX CREA
1.0000 | TOPICAL_CREAM | Freq: Two times a day (BID) | CUTANEOUS | 3 refills | Status: AC
Start: 2022-09-16 — End: ?

## 2022-09-16 MED ORDER — SERTRALINE HCL 100 MG PO TABS
100.0000 mg | ORAL_TABLET | Freq: Every day | ORAL | 3 refills | Status: DC
Start: 2022-09-16 — End: 2023-09-08

## 2022-09-21 ENCOUNTER — Other Ambulatory Visit: Payer: Self-pay | Admitting: Family Medicine

## 2022-09-21 DIAGNOSIS — M7989 Other specified soft tissue disorders: Secondary | ICD-10-CM

## 2022-10-23 ENCOUNTER — Other Ambulatory Visit: Payer: Medicare Other

## 2022-11-15 ENCOUNTER — Encounter: Payer: Self-pay | Admitting: Family Medicine

## 2022-11-15 MED ORDER — TRIAMCINOLONE ACETONIDE 0.5 % EX OINT: 1.0000 | TOPICAL_OINTMENT | Freq: Two times a day (BID) | CUTANEOUS | 2 refills | Status: AC

## 2022-11-18 ENCOUNTER — Ambulatory Visit
Admission: RE | Admit: 2022-11-18 | Discharge: 2022-11-18 | Disposition: A | Discharge status: Home / Self Care | Payer: Medicare Other | Source: Ambulatory Visit | Attending: Family Medicine | Admitting: Family Medicine

## 2022-11-18 ENCOUNTER — Other Ambulatory Visit: Payer: Self-pay | Admitting: Family Medicine

## 2022-11-18 DIAGNOSIS — M7989 Other specified soft tissue disorders: Secondary | ICD-10-CM

## 2022-11-18 DIAGNOSIS — R921 Mammographic calcification found on diagnostic imaging of breast: Secondary | ICD-10-CM

## 2023-03-11 ENCOUNTER — Encounter: Payer: Self-pay | Admitting: Family Medicine

## 2023-03-11 DIAGNOSIS — L309 Dermatitis, unspecified: Secondary | ICD-10-CM

## 2023-03-19 NOTE — Progress Notes (Deleted)
 [facility] at [facility] [address] [place], [place] [address] [phone]  Date:  04/02/2023   Name:  Rose Campbell   DOB:  13-Aug-1985   MRN:  [id number]  PCP:  Pearline Cables, MD    Chief Complaint: No chief complaint on file.   History of Present Illness:  Rose Campbell is a 38 y.o. very pleasant female patient who presents with the following:  Patient seen today for concern of a few symptoms.  Most recent visit with myself was in August.  At that time she was living with her sister.  Patient was no longer able to drive or do much in the way of errands.  Also not working  History of lupus, related significant joint disease at a young age. Status post bilateral hip replacements, she also has other joint pain Rheumatology per [facility]-most recent visit was in November, she continues to get infusions every 6 weeks:  Assessment 1. Inflammatory arthritis leflunomide (ARAVA) 20 mg tablet  2. Spondyloarthritis leflunomide (ARAVA) 20 mg tablet  3. Ankylosing spondylitis of sacral region (CMD) leflunomide (ARAVA) 20 mg tablet  4. Drug therapy  Plan/Orders Uncontrolled arthritis. Increase Remicade IV 500 mg every 6 weeks. Continue leflunomide, voltaren, tylenol #3. High risk medications, patient understands and agrees. Labs every 3 months. Return in about 6 months (around 06/03/2023).  Pap smear per GYN We got some labs for her in August-atrial get lab work more recently last month Patient Active Problem List   Diagnosis Date Noted   Ankylosing spondylitis (HCC) 07/24/2017   Unilateral primary osteoarthritis, right hip 03/05/2016   Osteoarthritis of left hip 08/03/2015   S/P total hip arthroplasty 08/03/2015   Latex allergy 08/01/2015   Weight gain due to medication 10/19/2014   Depression with anxiety 08/06/2013   Uses oral contraception 07/01/2012   Lupus 08/09/2011   Joint pain 07/23/2011    Past Medical  History:  Diagnosis Date   Arthritis    Depression    takes Cymbalta daily   Joint pain    Joint swelling    Lupus (HCC)    Polyarthralgia    takes Humira and Methotrexate   Seizures (HCC)    hx of-as a child. No meds    Past Surgical History:  Procedure Laterality Date   TOTAL HIP ARTHROPLASTY Left 08/03/2015   Procedure: LEFT TOTAL HIP ARTHROPLASTY;  Surgeon: Valeria Batman, MD;  Location: [facility] OR;  Service: Orthopedics;  Laterality: Left;   TOTAL HIP ARTHROPLASTY Right 03/05/2016   Procedure: TOTAL HIP ARTHROPLASTY;  Surgeon: Valeria Batman, MD;  Location: [facility] OR;  Service: Orthopedics;  Laterality: Right;    Social History   Tobacco Use   Smoking status: Never   Smokeless tobacco: Never  Vaping Use   Vaping status: Never Used  Substance Use Topics   Alcohol use: No   Drug use: No    No family history on file.  Allergies  Allergen Reactions   Latex Hives    Medication list has been reviewed and updated.  Current Outpatient Medications on File Prior to Visit  Medication Sig Dispense Refill   acetaminophen-codeine (TYLENOL #3) 300-30 MG tablet Take by mouth.     diclofenac (VOLTAREN) 50 MG EC tablet Take 50 mg by mouth 2 (two) times daily.     HYDROcodone-acetaminophen (NORCO/VICODIN) 5-325 MG tablet Take 1/2 or 1 at bedtime as needed for pain.  Please fill in 30 days 30  tablet 0   leflunomide (ARAVA) 20 MG tablet Take 1 tablet by mouth daily.     sertraline (ZOLOFT) 100 MG tablet Take 1 tablet (100 mg total) by mouth daily. 90 tablet 3   TRI-LO-MILI 0.18/0.215/0.25 MG-25 MCG tab Take 1 tablet by mouth daily.     triamcinolone cream (KENALOG) 0.1 % Apply 1 Application topically 2 (two) times daily. Use as needed for rash 80 g 3   triamcinolone ointment (KENALOG) 0.5 % Apply 1 Application topically 2 (two) times daily. 30 g 2   No current facility-administered medications on file prior to visit.    Review of Systems:  As per HPI- otherwise  negative.   Physical Examination: There were no vitals filed for this visit. There were no vitals filed for this visit. There is no height or weight on file to calculate BMI. Ideal Body Weight:    GEN: no acute distress. HEENT: Atraumatic, Normocephalic.  Ears and Nose: No external deformity. CV: RRR, No M/G/R. No JVD. No thrill. No extra heart sounds. PULM: CTA B, no wheezes, crackles, rhonchi. No retractions. No resp. distress. No accessory muscle use. ABD: S, NT, ND, +BS. No rebound. No HSM. EXTR: No c/c/e PSYCH: Normally interactive. Conversant.    Assessment and Plan: ***  Signed Irish Piech, MDI called her, no answer.  Left message on her voicemail.  We are checking on this issue, want to make sure is nothing more acute.  We will get her on the schedule but please let me know if it is urgent

## 2023-03-27 ENCOUNTER — Ambulatory Visit: Payer: Medicare Other | Admitting: Family Medicine

## 2023-04-02 ENCOUNTER — Ambulatory Visit: Payer: Medicare Other | Admitting: Family Medicine

## 2023-04-02 DIAGNOSIS — M459 Ankylosing spondylitis of unspecified sites in spine: Secondary | ICD-10-CM

## 2023-04-02 DIAGNOSIS — M1990 Unspecified osteoarthritis, unspecified site: Secondary | ICD-10-CM

## 2023-04-04 ENCOUNTER — Telehealth: Payer: Self-pay | Admitting: Family Medicine

## 2023-04-04 NOTE — Telephone Encounter (Signed)
 FYI

## 2023-04-04 NOTE — Telephone Encounter (Signed)
 Copied from CRM [id number]. Topic: General - Other >> Apr 04, 2023  9:41 AM Sonny Dandy B wrote: Reason for CRM: Meriam Sprague from [facility] called regarding referral, states she has contacted the pt several times with no response. They will be declining the referral at this time. d

## 2023-08-03 ENCOUNTER — Encounter: Payer: Self-pay | Admitting: Family Medicine

## 2023-08-19 ENCOUNTER — Encounter: Payer: Self-pay | Admitting: Family Medicine

## 2023-09-07 ENCOUNTER — Other Ambulatory Visit: Payer: Self-pay | Admitting: Family Medicine

## 2023-09-07 DIAGNOSIS — F4323 Adjustment disorder with mixed anxiety and depressed mood: Secondary | ICD-10-CM

## 2023-09-17 ENCOUNTER — Encounter: Payer: Self-pay | Admitting: Family Medicine

## 2023-09-23 NOTE — Progress Notes (Unsigned)
 [facility] at [facility] [address] [place], [place] [address] [phone]  Date:  09/25/2023   Name:  Rose Campbell   DOB:  Jul 16, 1985   MRN:  [id number]  PCP:  Watt Harlene BROCKS, MD    Chief Complaint: No chief complaint on file.   History of Present Illness:  Rose Campbell is a 38 y.o. very pleasant female patient who presents with the following:  Patient seen today for virtual visit to discuss recent stressors and associated anxiety and depression symptoms Patient location is home, my location is office.  Patient identity confirmed with 2 factors, she gives consent for virtual visit today. The patient and myself are present on the video call today  Krisa her own significant musculoskeletal problems.  In addition, her sister April has recently been admitted with a critical illness which of course has been a stressor  Discussed the use of AI scribe software for clinical note transcription with the patient, who gave verbal consent to proceed.  History of Present Illness     Patient Active Problem List   Diagnosis Date Noted   Ankylosing spondylitis (HCC) 07/24/2017   Unilateral primary osteoarthritis, right hip 03/05/2016   Osteoarthritis of left hip 08/03/2015   S/P total hip arthroplasty 08/03/2015   Latex allergy 08/01/2015   Weight gain due to medication 10/19/2014   Depression with anxiety 08/06/2013   Uses oral contraception 07/01/2012   Lupus 08/09/2011   Joint pain 07/23/2011    Past Medical History:  Diagnosis Date   Arthritis    Depression    takes Cymbalta  daily   Joint pain    Joint swelling    Lupus (HCC)    Polyarthralgia    takes Humira and Methotrexate   Seizures (HCC)    hx of-as a child. No meds    Past Surgical History:  Procedure Laterality Date   TOTAL HIP ARTHROPLASTY Left 08/03/2015   Procedure: LEFT TOTAL HIP ARTHROPLASTY;  Surgeon: Maude LELON Right, MD;  Location: [facility] OR;   Service: Orthopedics;  Laterality: Left;   TOTAL HIP ARTHROPLASTY Right 03/05/2016   Procedure: TOTAL HIP ARTHROPLASTY;  Surgeon: Maude LELON Right, MD;  Location: [facility] OR;  Service: Orthopedics;  Laterality: Right;    Social History   Tobacco Use   Smoking status: Never   Smokeless tobacco: Never  Vaping Use   Vaping status: Never Used  Substance Use Topics   Alcohol use: No   Drug use: No    No family history on file.  Allergies  Allergen Reactions   Latex Hives    Medication list has been reviewed and updated.  Current Outpatient Medications on File Prior to Visit  Medication Sig Dispense Refill   acetaminophen -codeine (TYLENOL  #3) 300-30 MG tablet Take by mouth.     diclofenac (VOLTAREN) 50 MG EC tablet Take 50 mg by mouth 2 (two) times daily.     HYDROcodone -acetaminophen  (NORCO/VICODIN) 5-325 MG tablet Take 1/2 or 1 at bedtime as needed for pain.  Please fill in 30 days 30 tablet 0   leflunomide (ARAVA) 20 MG tablet Take 1 tablet by mouth daily.     sertraline  (ZOLOFT ) 100 MG tablet TAKE 1 TABLET BY MOUTH DAILY 90 tablet 0   TRI-LO-MILI 0.18/0.215/0.25 MG-25 MCG tab Take 1 tablet by mouth daily.     triamcinolone  cream (KENALOG ) 0.1 % Apply 1 Application topically 2 (two) times daily. Use as needed for rash 80 g 3  triamcinolone  ointment (KENALOG ) 0.5 % Apply 1 Application topically 2 (two) times daily. 30 g 2   No current facility-administered medications on file prior to visit.    Review of Systems:  As per HPI- otherwise negative.   Physical Examination: There were no vitals filed for this visit. There were no vitals filed for this visit. There is no height or weight on file to calculate BMI. Ideal Body Weight:    ***  Assessment and Plan: No diagnosis found.  Assessment & Plan   Signed Harlene Schroeder, MD

## 2023-09-25 ENCOUNTER — Telehealth (INDEPENDENT_AMBULATORY_CARE_PROVIDER_SITE_OTHER): Admitting: Family Medicine

## 2023-09-25 DIAGNOSIS — N925 Other specified irregular menstruation: Secondary | ICD-10-CM | POA: Diagnosis not present

## 2023-09-25 DIAGNOSIS — F4323 Adjustment disorder with mixed anxiety and depressed mood: Secondary | ICD-10-CM | POA: Diagnosis not present

## 2023-09-27 NOTE — Patient Instructions (Incomplete)
It was great to see you again today!  

## 2023-09-27 NOTE — Progress Notes (Unsigned)
 [facility] at [facility] [address] [place], [place] [address] [phone]  Date:  09/29/2023   Name:  Rose Campbell   DOB:  1985/09/18   MRN:  [id number]  PCP:  Watt Harlene BROCKS, MD    Chief Complaint: No chief complaint on file.   History of Present Illness:  Rose Campbell is a 38 y.o. very pleasant female patient who presents with the following:  Patient seen today for an in person evaluation and lab work.  We had a brief virtual visit last week when she described menstrual bleeding for about 3 weeks.  Patient felt certain she cannot be pregnant as she had not been sexually active.  She was still taking her birth control pill.  Teal does have ankylosing spondylitis and severe hip arthritis-she has already had bilateral hip replacements.  In addition her sister April was recently in the hospital for almost 2 months with critical illness and hemorrhagic stroke We increased her sertraline  from 100 to 200 mg at her virtual visit last week  Arava daily Sertraline  Low-dose birth control pill   Discussed the use of AI scribe software for clinical note transcription with the patient, who gave verbal consent to proceed.  History of Present Illness     Patient Active Problem List   Diagnosis Date Noted   Ankylosing spondylitis (HCC) 07/24/2017   Unilateral primary osteoarthritis, right hip 03/05/2016   Osteoarthritis of left hip 08/03/2015   S/P total hip arthroplasty 08/03/2015   Latex allergy 08/01/2015   Weight gain due to medication 10/19/2014   Depression with anxiety 08/06/2013   Uses oral contraception 07/01/2012   Lupus 08/09/2011   Joint pain 07/23/2011    Past Medical History:  Diagnosis Date   Arthritis    Depression    takes Cymbalta  daily   Joint pain    Joint swelling    Lupus    Polyarthralgia    takes Humira and Methotrexate   Seizures (HCC)    hx of-as a child. No meds    Past Surgical  History:  Procedure Laterality Date   TOTAL HIP ARTHROPLASTY Left 08/03/2015   Procedure: LEFT TOTAL HIP ARTHROPLASTY;  Surgeon: Maude LELON Right, MD;  Location: [facility] OR;  Service: Orthopedics;  Laterality: Left;   TOTAL HIP ARTHROPLASTY Right 03/05/2016   Procedure: TOTAL HIP ARTHROPLASTY;  Surgeon: Maude LELON Right, MD;  Location: [facility] OR;  Service: Orthopedics;  Laterality: Right;    Social History   Tobacco Use   Smoking status: Never   Smokeless tobacco: Never  Vaping Use   Vaping status: Never Used  Substance Use Topics   Alcohol use: No   Drug use: No    No family history on file.  Allergies  Allergen Reactions   Latex Hives    Medication list has been reviewed and updated.  Current Outpatient Medications on File Prior to Visit  Medication Sig Dispense Refill   acetaminophen -codeine (TYLENOL  #3) 300-30 MG tablet Take by mouth.     diclofenac (VOLTAREN) 50 MG EC tablet Take 50 mg by mouth 2 (two) times daily.     HYDROcodone -acetaminophen  (NORCO/VICODIN) 5-325 MG tablet Take 1/2 or 1 at bedtime as needed for pain.  Please fill in 30 days 30 tablet 0   leflunomide (ARAVA) 20 MG tablet Take 1 tablet by mouth daily.     sertraline  (ZOLOFT ) 100 MG tablet TAKE 1 TABLET BY MOUTH DAILY 90 tablet 0  TRI-LO-MILI 0.18/0.215/0.25 MG-25 MCG tab Take 1 tablet by mouth daily.     triamcinolone  cream (KENALOG ) 0.1 % Apply 1 Application topically 2 (two) times daily. Use as needed for rash 80 g 3   triamcinolone  ointment (KENALOG ) 0.5 % Apply 1 Application topically 2 (two) times daily. 30 g 2   No current facility-administered medications on file prior to visit.    Review of Systems:  As per HPI- otherwise negative.   Physical Examination: There were no vitals filed for this visit. There were no vitals filed for this visit. There is no height or weight on file to calculate BMI. Ideal Body Weight:    GEN: no acute distress. HEENT: Atraumatic, Normocephalic.  Ears and Nose:  No external deformity. CV: RRR, No M/G/R. No JVD. No thrill. No extra heart sounds. PULM: CTA B, no wheezes, crackles, rhonchi. No retractions. No resp. distress. No accessory muscle use. ABD: S, NT, ND, +BS. No rebound. No HSM. EXTR: No c/c/e PSYCH: Normally interactive. Conversant.    Assessment and Plan: No diagnosis found.  Assessment & Plan   Signed Harlene Schroeder, MD

## 2023-09-29 ENCOUNTER — Encounter: Payer: Self-pay | Admitting: Family Medicine

## 2023-09-29 ENCOUNTER — Ambulatory Visit (INDEPENDENT_AMBULATORY_CARE_PROVIDER_SITE_OTHER): Admitting: Family Medicine

## 2023-09-29 VITALS — BP 130/80 | HR 84 | Ht 61.0 in | Wt 154.0 lb

## 2023-09-29 DIAGNOSIS — M2550 Pain in unspecified joint: Secondary | ICD-10-CM | POA: Diagnosis not present

## 2023-09-29 DIAGNOSIS — M459 Ankylosing spondylitis of unspecified sites in spine: Secondary | ICD-10-CM | POA: Diagnosis not present

## 2023-09-29 DIAGNOSIS — Z1329 Encounter for screening for other suspected endocrine disorder: Secondary | ICD-10-CM

## 2023-09-29 DIAGNOSIS — Z13220 Encounter for screening for lipoid disorders: Secondary | ICD-10-CM

## 2023-09-29 DIAGNOSIS — N926 Irregular menstruation, unspecified: Secondary | ICD-10-CM

## 2023-09-29 DIAGNOSIS — N921 Excessive and frequent menstruation with irregular cycle: Secondary | ICD-10-CM | POA: Diagnosis not present

## 2023-09-29 DIAGNOSIS — Z131 Encounter for screening for diabetes mellitus: Secondary | ICD-10-CM

## 2023-09-29 DIAGNOSIS — R109 Unspecified abdominal pain: Secondary | ICD-10-CM

## 2023-09-29 MED ORDER — NORETHINDRONE ACET-ETHINYL EST 1-20 MG-MCG PO TABS: 1.0000 | ORAL_TABLET | Freq: Every day | ORAL | 3 refills | Status: AC

## 2023-09-29 MED ORDER — TRAMADOL HCL 50 MG PO TABS: ORAL_TABLET | ORAL | 0 refills | Status: DC

## 2023-09-30 ENCOUNTER — Other Ambulatory Visit

## 2023-09-30 ENCOUNTER — Encounter: Payer: Self-pay | Admitting: Family Medicine

## 2023-09-30 LAB — COMPREHENSIVE METABOLIC PANEL WITH GFR
ALT: 13 U/L (ref 0–35)
AST: 13 U/L (ref 0–37)
Albumin: 3.7 g/dL (ref 3.5–5.2)
Alkaline Phosphatase: 84 U/L (ref 39–117)
BUN: 14 mg/dL (ref 6–23)
CO2: 26 meq/L (ref 19–32)
Calcium: 9 mg/dL (ref 8.4–10.5)
Chloride: 105 meq/L (ref 96–112)
Creatinine, Ser: 0.65 mg/dL (ref 0.40–1.20)
GFR: 112.83 mL/min (ref >60.00)
Glucose, Bld: 67 mg/dL — ABNORMAL LOW (ref 70–99)
Potassium: 4 meq/L (ref 3.5–5.1)
Sodium: 139 meq/L (ref 135–145)
Total Bilirubin: 0.4 mg/dL (ref 0.2–1.2)
Total Protein: 7.3 g/dL (ref 6.0–8.3)

## 2023-09-30 LAB — LIPID PANEL
Cholesterol: 132 mg/dL (ref 0–200)
HDL: 68.5 mg/dL (ref >39.00)
LDL Cholesterol: 42 mg/dL (ref 0–99)
NonHDL: 63.97
Total CHOL/HDL Ratio: 2
Triglycerides: 109 mg/dL (ref 0.0–149.0)
VLDL: 21.8 mg/dL (ref 0.0–40.0)

## 2023-09-30 LAB — CBC
HCT: 37.2 % (ref 36.0–46.0)
Hemoglobin: 12.3 g/dL (ref 12.0–15.0)
MCHC: 33.1 g/dL (ref 30.0–36.0)
MCV: 90.1 fl (ref 78.0–100.0)
Platelets: 365 K/uL (ref 150.0–400.0)
RBC: 4.13 Mil/uL (ref 3.87–5.11)
RDW: 13.3 % (ref 11.5–15.5)
WBC: 6.1 K/uL (ref 4.0–10.5)

## 2023-09-30 LAB — HEMOGLOBIN A1C: Hgb A1c MFr Bld: 4.7 % (ref 4.6–6.5)

## 2023-09-30 LAB — TSH: TSH: 0.91 u[IU]/mL (ref 0.35–5.50)

## 2023-10-28 ENCOUNTER — Encounter: Payer: Self-pay | Admitting: Family Medicine

## 2023-10-28 DIAGNOSIS — M2550 Pain in unspecified joint: Secondary | ICD-10-CM

## 2023-10-28 DIAGNOSIS — M459 Ankylosing spondylitis of unspecified sites in spine: Secondary | ICD-10-CM

## 2023-10-29 MED ORDER — TRAMADOL HCL 50 MG PO TABS
ORAL_TABLET | ORAL | 0 refills | Status: DC
Start: 2023-10-29 — End: 2023-10-29

## 2023-10-29 MED ORDER — TRAMADOL HCL 50 MG PO TABS: ORAL_TABLET | ORAL | 0 refills | Status: DC

## 2023-10-29 NOTE — Addendum Note (Signed)
 Addended by: WATT RAISIN C on: 10/29/2023 09:34 AM   Modules accepted: Orders

## 2023-10-29 NOTE — Addendum Note (Signed)
 Addended by: WATT RAISIN C on: 10/29/2023 09:23 AM   Modules accepted: Orders

## 2023-11-10 ENCOUNTER — Other Ambulatory Visit: Payer: Self-pay | Admitting: Family Medicine

## 2023-11-10 ENCOUNTER — Encounter: Payer: Self-pay | Admitting: Family Medicine

## 2023-11-10 DIAGNOSIS — M459 Ankylosing spondylitis of unspecified sites in spine: Secondary | ICD-10-CM

## 2023-11-10 DIAGNOSIS — F4323 Adjustment disorder with mixed anxiety and depressed mood: Secondary | ICD-10-CM

## 2023-11-10 DIAGNOSIS — M2550 Pain in unspecified joint: Secondary | ICD-10-CM

## 2023-11-10 MED ORDER — SERTRALINE HCL 100 MG PO TABS: 200.0000 mg | ORAL_TABLET | Freq: Every day | ORAL | 3 refills | Status: AC

## 2023-12-15 ENCOUNTER — Other Ambulatory Visit: Payer: Self-pay | Admitting: Family Medicine

## 2023-12-15 DIAGNOSIS — F4323 Adjustment disorder with mixed anxiety and depressed mood: Secondary | ICD-10-CM

## 2023-12-22 ENCOUNTER — Telehealth: Payer: Self-pay | Admitting: Family Medicine

## 2023-12-22 NOTE — Telephone Encounter (Signed)
 Copied from CRM #[id number]. Topic: Medicare AWV >> Dec 22, 2023 10:52 AM Nathanel DEL wrote: Called LVM 12/22/2023 to sched AWV. Please schedule in office or virtual visit.   Nathanel Paschal; Care Guide Ambulatory Clinical Support [place] [facility] Direct Dial: [phone]
